# Patient Record
Sex: Male | Born: 1960 | Race: White | Hispanic: No | Smoking: Current some day smoker
Health system: Southern US, Community
[De-identification: ages and names within clinical notes are randomized; demographics above are authoritative.]

## PROBLEM LIST (undated history)

## (undated) DIAGNOSIS — C140 Malignant neoplasm of pharynx, unspecified: Secondary | ICD-10-CM

## (undated) DIAGNOSIS — F32A Depression, unspecified: Secondary | ICD-10-CM

## (undated) DIAGNOSIS — F329 Major depressive disorder, single episode, unspecified: Secondary | ICD-10-CM

## (undated) DIAGNOSIS — C3490 Malignant neoplasm of unspecified part of unspecified bronchus or lung: Secondary | ICD-10-CM

## (undated) DIAGNOSIS — K709 Alcoholic liver disease, unspecified: Secondary | ICD-10-CM

## (undated) DIAGNOSIS — R011 Cardiac murmur, unspecified: Secondary | ICD-10-CM

## (undated) DIAGNOSIS — I639 Cerebral infarction, unspecified: Secondary | ICD-10-CM

## (undated) DIAGNOSIS — Z923 Personal history of irradiation: Secondary | ICD-10-CM

## (undated) DIAGNOSIS — F1910 Other psychoactive substance abuse, uncomplicated: Secondary | ICD-10-CM

## (undated) DIAGNOSIS — I4891 Unspecified atrial fibrillation: Secondary | ICD-10-CM

## (undated) DIAGNOSIS — T7840XA Allergy, unspecified, initial encounter: Secondary | ICD-10-CM

## (undated) HISTORY — DX: Personal history of irradiation: Z92.3

## (undated) HISTORY — DX: Cerebral infarction, unspecified: I63.9

## (undated) HISTORY — DX: Major depressive disorder, single episode, unspecified: F32.9

## (undated) HISTORY — DX: Depression, unspecified: F32.A

## (undated) HISTORY — DX: Cardiac murmur, unspecified: R01.1

## (undated) HISTORY — PX: LOBECTOMY: SHX5089

## (undated) HISTORY — DX: Other psychoactive substance abuse, uncomplicated: F19.10

## (undated) HISTORY — DX: Allergy, unspecified, initial encounter: T78.40XA

## (undated) HISTORY — DX: Malignant neoplasm of unspecified part of unspecified bronchus or lung: C34.90

---

## 1999-08-30 ENCOUNTER — Inpatient Hospital Stay (HOSPITAL_COMMUNITY): Admission: EM | Admit: 1999-08-30 | Discharge: 1999-08-31 | Payer: Self-pay | Admitting: Emergency Medicine

## 1999-08-30 ENCOUNTER — Encounter: Payer: Self-pay | Admitting: Emergency Medicine

## 1999-09-14 ENCOUNTER — Ambulatory Visit (HOSPITAL_COMMUNITY): Admission: RE | Admit: 1999-09-14 | Discharge: 1999-09-14 | Payer: Self-pay | Admitting: Cardiology

## 2001-07-13 ENCOUNTER — Emergency Department (HOSPITAL_COMMUNITY): Admission: EM | Admit: 2001-07-13 | Discharge: 2001-07-13 | Payer: Self-pay | Admitting: Emergency Medicine

## 2001-07-13 ENCOUNTER — Encounter: Payer: Self-pay | Admitting: Emergency Medicine

## 2011-02-26 ENCOUNTER — Ambulatory Visit: Payer: Self-pay | Admitting: Hematology & Oncology

## 2011-08-13 ENCOUNTER — Emergency Department (HOSPITAL_COMMUNITY)
Admission: EM | Admit: 2011-08-13 | Discharge: 2011-08-13 | Disposition: A | Discharge status: Home / Self Care | Payer: Self-pay | Attending: Emergency Medicine | Admitting: Emergency Medicine

## 2011-08-13 DIAGNOSIS — R609 Edema, unspecified: Secondary | ICD-10-CM | POA: Insufficient documentation

## 2011-08-13 DIAGNOSIS — M7989 Other specified soft tissue disorders: Secondary | ICD-10-CM | POA: Insufficient documentation

## 2011-08-13 DIAGNOSIS — F17200 Nicotine dependence, unspecified, uncomplicated: Secondary | ICD-10-CM | POA: Insufficient documentation

## 2011-08-13 DIAGNOSIS — Z85819 Personal history of malignant neoplasm of unspecified site of lip, oral cavity, and pharynx: Secondary | ICD-10-CM | POA: Insufficient documentation

## 2011-08-13 HISTORY — DX: Unspecified atrial fibrillation: I48.91

## 2011-08-13 HISTORY — DX: Malignant neoplasm of pharynx, unspecified: C14.0

## 2011-08-13 HISTORY — DX: Alcoholic liver disease, unspecified: K70.9

## 2011-08-13 NOTE — ED Notes (Signed)
Per EMS- Pt. c/o leg swelling to both legs and has had x 3 months.

## 2011-08-13 NOTE — ED Notes (Signed)
WUJ:[id number]<JY> Expected date:08/13/11<BR> Expected time: 4:00 PM<BR> Means of arrival:Ambulance<BR> Comments:<BR> EMS 71 GC, 50 yom peripheral edema for 3 months

## 2011-08-13 NOTE — ED Provider Notes (Signed)
History     CSN: [id number] Arrival date & time: 08/13/2011  4:51 PM   First MD Initiated Contact with Patient 08/13/11 1704    Chief Complaint  Patient presents with  . Leg Swelling    HPI Onset - several months ago Duration - constant Course - worsening gradually Worsened by - nothing Improved by - nothing Pt denies cp/sob/orthopnea/dyspnea on exertion/weakness/back pain/abd pain/abd distention  He reports h/o liver disease He also has h/o throat CA - he denies active chemo/radiation  When asked what brought him in today, he reports has had pain with walking for months No new LE weakness No falls He reports recent admission to [place]    Past Medical History  Diagnosis Date  . Atrial fibrillation   . Throat cancer   . Diabetes mellitus   . 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 Lobectomy     History reviewed. No pertinent family history.  History  Substance Use Topics  . Smoking status: Current Everyday Smoker -- 5 years  . Smokeless tobacco: Never Used  . Alcohol Use: No      Review of Systems  All other systems reviewed and are negative.    Allergies  Review of patient's allergies indicates no known allergies.  Home Medications  No current outpatient prescriptions on file.  BP 111/72  Pulse 79  Temp(Src) 98 F (36.7 C) (Oral)  Resp 20  SpO2 98%  Physical Exam  CONSTITUTIONAL: Well developed/well nourished HEAD AND FACE: Normocephalic/atraumatic EYES: EOMI/PERRL, scleral icterus ENMT: Mucous membranes moist NECK: supple no meningeal signs SPINE:entire spine nontender CV:  no murmurs/rubs/gallops noted LUNGS: Lungs are clear to auscultation bilaterally, no apparent distress ABDOMEN: soft, nontender, no rebound or guarding GU:no cva tenderness NEURO: Pt is awake/alert, moves all extremitiesx4, no focal motor deficits EXTREMITIES: pulses normal, full ROM Symmetric, pitting edema noted in the  Bilateral LE, no erythema noted SKIN: warm, color normal PSYCH: no abnormalities of mood noted   ED Course  Procedures    5:48 PM Will call for old records at [facility]  6:28 PM Records from [facility] Pt with h/o cirrhosis, h/o malnutrition H/o alcohol dependence Pt stable, no signs of CHF Able to ambulate This is a chronic issue, needs outpatient followup  The patient appears reasonably screened and/or stabilized for discharge and I doubt any other medical condition or other [facility] requiring further screening, evaluation, or treatment in the ED at this time prior to discharge.      MDM  Nursing notes reviewed and considered in documentation         Joya Gaskins, MD 08/13/11 [phone]

## 2011-08-13 NOTE — ED Notes (Signed)
Ambulated with use of cane. Pt. C/o burning pain in legs when he ambulated. Pt. Walked on heels due to pain.

## 2011-10-10 ENCOUNTER — Ambulatory Visit: Payer: Self-pay | Admitting: Family Medicine

## 2012-01-28 ENCOUNTER — Encounter: Payer: Self-pay | Admitting: Family Medicine

## 2012-01-28 ENCOUNTER — Ambulatory Visit (INDEPENDENT_AMBULATORY_CARE_PROVIDER_SITE_OTHER): Payer: Self-pay | Admitting: Family Medicine

## 2012-01-28 VITALS — BP 160/95 | HR 79 | Temp 98.4°F | Ht 72.0 in | Wt 179.8 lb

## 2012-01-28 DIAGNOSIS — Z85118 Personal history of other malignant neoplasm of bronchus and lung: Secondary | ICD-10-CM

## 2012-01-28 DIAGNOSIS — E119 Type 2 diabetes mellitus without complications: Secondary | ICD-10-CM

## 2012-01-28 DIAGNOSIS — Z85819 Personal history of malignant neoplasm of unspecified site of lip, oral cavity, and pharynx: Secondary | ICD-10-CM

## 2012-01-28 DIAGNOSIS — F1011 Alcohol abuse, in remission: Secondary | ICD-10-CM

## 2012-01-28 DIAGNOSIS — Z8673 Personal history of transient ischemic attack (TIA), and cerebral infarction without residual deficits: Secondary | ICD-10-CM

## 2012-01-28 DIAGNOSIS — G8929 Other chronic pain: Secondary | ICD-10-CM

## 2012-01-28 DIAGNOSIS — M2550 Pain in unspecified joint: Secondary | ICD-10-CM

## 2012-01-28 DIAGNOSIS — I10 Essential (primary) hypertension: Secondary | ICD-10-CM

## 2012-01-28 DIAGNOSIS — J309 Allergic rhinitis, unspecified: Secondary | ICD-10-CM

## 2012-01-28 LAB — CBC
HCT: 44 % (ref 39.0–52.0)
Hemoglobin: 15.8 g/dL (ref 13.0–17.0)
MCHC: 35.9 g/dL (ref 30.0–36.0)
MCV: 92.6 fL (ref 78.0–100.0)
RDW: 13.6 % (ref 11.5–15.5)
WBC: 3.9 10*3/uL — ABNORMAL LOW (ref 4.0–10.5)

## 2012-01-28 LAB — COMPREHENSIVE METABOLIC PANEL
AST: 23 U/L (ref 0–37)
Albumin: 4.4 g/dL (ref 3.5–5.2)
BUN: 14 mg/dL (ref 6–23)
Calcium: 9.4 mg/dL (ref 8.4–10.5)
Chloride: 101 mEq/L (ref 96–112)
Creat: 1.04 mg/dL (ref 0.50–1.35)
Glucose, Bld: 245 mg/dL — ABNORMAL HIGH (ref 70–99)
Potassium: 4.1 mEq/L (ref 3.5–5.3)

## 2012-01-28 NOTE — Progress Notes (Signed)
  Subjective:    Patient ID: William Townsend, male    DOB: 1961-06-05, 51 y.o.   MRN: [id number]  HPI  Foot pain, chronic pain:  patient has had chronic pain of multiple joints, both feet have been bothering him the most. Pain is located at bottom of foot bilaterally. He describes it as a tingly sensation at first. However, after walking or working on his feet all day, pain becomes worse.  He takes one Aleve daily which seems to help. He has also been using Dr. Margart Sickles shoe inserts to keep pressure  and off bottom of the foot.  Diabetes type 2: Patient has run out of metformin 4 weeks ago.  He complains of tingling sensation bilateral feet. Denies any blurry vision, headache, sweating, lightheadedness or dizziness. Patient does not check sugars. He tries to avoid sugary foods and he is physically active.  Elevated blood pressure: Patient denies any history of hypertension. He was taking propanolol twice a day but he ran out of medication several months ago. He denies any headache, blurry vision, fatigue, chest pain, difficulty breathing. Endorses numbness tingling bilateral feet.    Review of Systems Per history of present illness    Objective:   Physical Exam  Constitutional: No distress.  Neck: Neck supple.  Cardiovascular: Normal rate, normal heart sounds and intact distal pulses.   No murmur heard. Pulmonary/Chest: Effort normal and breath sounds normal. He has no wheezes. He has no rales.  Abdominal: Soft. Bowel sounds are normal. He exhibits no distension. There is no tenderness.  Musculoskeletal: Normal range of motion. He exhibits no edema.  Neurological: He is alert. No cranial nerve deficit.          Assessment & Plan:

## 2012-01-28 NOTE — Assessment & Plan Note (Signed)
Will order kidney function tests and A1c today. Will restart metformin if kidney function is normal.

## 2012-01-28 NOTE — Assessment & Plan Note (Signed)
Patient's blood pressure is elevated today. He has stopped taking propanolol several months ago. Will check CBC and electrolytes today. Patient is planning to renew orange card and will hopefully qualify for medical assistance program. Will repeat blood pressure in 4 weeks. If blood pressure still elevated, plan to restart blood pressure medications.

## 2012-01-28 NOTE — Assessment & Plan Note (Signed)
Most painful site today: Bilateral foot pain. May be secondary to plantar fasciitis versus diabetic neuropathy versus osteoarthritis. Check A1c to see if patient's diabetes is well controlled. Recommended Dr. Margart Sickles shoe inserts if this is plantar fasciitis. Continue Aleve 3 times a day as needed for pain. Followup in 4 weeks.

## 2012-01-28 NOTE — Patient Instructions (Signed)
It was nice to meet you today, Mr. William Townsend. I will call or send you a letter in 2-3 days with lab results. For foot pain, continue to take Aleve 2 tablets every 8 hours as needed. You may ice your feet and elevate them while resting. Please schedule follow up appointment with Dr. Tye Savoy in 4 weeks. Let us know when you have renewed your [facility].

## 2012-02-04 ENCOUNTER — Telehealth: Payer: Self-pay | Admitting: Family Medicine

## 2012-02-04 NOTE — Telephone Encounter (Signed)
Pt hasn't heard anything about the results of his labs.

## 2012-02-05 ENCOUNTER — Encounter: Payer: Self-pay | Admitting: Family Medicine

## 2012-02-05 NOTE — Telephone Encounter (Signed)
Please inform him that hemoglobin A1c was normal.  All other labs were within normal limits.  I have sent a letter to his address for his records.  Thank you.

## 2012-02-06 NOTE — Telephone Encounter (Signed)
LMOM for patient to call back. I mailed the letter out today also

## 2012-02-28 ENCOUNTER — Ambulatory Visit: Payer: Self-pay | Admitting: Family Medicine

## 2012-03-24 ENCOUNTER — Ambulatory Visit: Payer: Self-pay | Admitting: Family Medicine

## 2013-04-01 ENCOUNTER — Telehealth: Payer: Self-pay | Admitting: *Deleted

## 2013-04-01 NOTE — Telephone Encounter (Signed)
Pt called and voice mail left regarding scheduling of yearly diabetic check and A1C , letter also sent to pt home. Elizabeth Eyob Godlewski, RN-BSN   

## 2013-04-02 ENCOUNTER — Encounter: Payer: Self-pay | Admitting: Family Medicine

## 2013-04-02 ENCOUNTER — Ambulatory Visit (INDEPENDENT_AMBULATORY_CARE_PROVIDER_SITE_OTHER): Payer: Self-pay | Admitting: Family Medicine

## 2013-04-02 VITALS — BP 138/89 | HR 76 | Temp 98.8°F | Ht 72.0 in | Wt 175.0 lb

## 2013-04-02 DIAGNOSIS — F17200 Nicotine dependence, unspecified, uncomplicated: Secondary | ICD-10-CM

## 2013-04-02 DIAGNOSIS — I10 Essential (primary) hypertension: Secondary | ICD-10-CM

## 2013-04-02 DIAGNOSIS — R519 Headache, unspecified: Secondary | ICD-10-CM | POA: Insufficient documentation

## 2013-04-02 DIAGNOSIS — Z720 Tobacco use: Secondary | ICD-10-CM

## 2013-04-02 DIAGNOSIS — E119 Type 2 diabetes mellitus without complications: Secondary | ICD-10-CM

## 2013-04-02 DIAGNOSIS — R51 Headache: Secondary | ICD-10-CM

## 2013-04-02 MED ORDER — CYCLOBENZAPRINE HCL 10 MG PO TABS: 10.0000 mg | ORAL_TABLET | Freq: Two times a day (BID) | ORAL | Status: DC | PRN

## 2013-04-02 MED ORDER — HYDROCODONE-IBUPROFEN 5-200 MG PO TABS: 1.0000 | ORAL_TABLET | Freq: Three times a day (TID) | ORAL | Status: DC | PRN

## 2013-04-02 NOTE — Progress Notes (Signed)
Subjective:     Patient ID: William Townsend, male   DOB: 07/03/1961, 52 y.o.   MRN: [id number]  HPI  Headache: Patient has a history of headache for 1 month. He reports it is mostly over his left side and eye area, along with across his forehead. He reports he has been taking Advil for it daily and it is not working. He borrowed a few hydrocodone from his roommate and they helped a little, but did not take it away. He has noticed over the weekend it has become worse, keeping him awake at night. He noticed mild intermittent blurry vision that started yesterday. He wears reading glasses, but has not been to eye doctor in years. Patient has a history of diabetes, but currently is not on medications. He reports he was told he no longer needed them the last time he saw a doctor. He also has a history of cancer of the throat and lung, and he continues to smoke. He had a h/o of hypertension, but also controlled without medications. He admits to weight loss over the years that he believes contributed to his improved health. He denies nausea, vomit, diarrhea or congestion. He denies fever.   Patient's past medical, social, and family history were reviewed and updated as appropriate. Review of Systems Negative, with the exception of above mentioned in HPI     Objective:   Physical Exam BP 138/89  Pulse 76  Temp(Src) 98.8 F (37.1 C) (Oral)  Ht 6' (1.829 m)  Wt 175 lb (79.379 kg)  BMI 23.73 kg/m2 Gen: Afebrile. Pleasant. Quiet thin male. NAD HEENT: AT.[place]. No TTP. Bilateral eyes without injections or icterus. Bilateral ears gray, translucent (mildy dull) without erythema, neutral position. Throat: MMM, no erythema. No facial TTP over sinuses.  CV: RRR Chest CTAB Neuro: PERLA, EOMI. Muscle strength symmetric and 5/5 UE/LE. CN 2-12 intact, without deficits. DTR symmetric  Vision exam per nursing staff: OD: 20/30  OS: 20/40  Bilateral: 20/25

## 2013-04-02 NOTE — Patient Instructions (Addendum)
Cluster Headache Cluster headaches are recognized by their pattern of deep, intense head pain. They normally occur on one side of the head. Typically, cluster headaches:  Are severe in nature.  Occur repeatedly over weeks to months at a time and are then followed by periods of no headaches.  Can last 15 minutes to 3 hours.  Occur at the same time each day, often at night.  Occur several times a day. CAUSES The exact cause of a cluster headache is not known. Some things can trigger a cluster headache, such as:  Smoking.  Alcohol use.  Lack of sleep.  High altitude travel, such as airline travel.  Change of seasons.  Certain foods, such as foods or drinks that contain nitrates, glutamate, aspartame, or tyramine. SYMPTOMS   Severe pain that begins in or around the eye or temple.  One-sided head pain.  Feeling sick to your stomach (nauseous).  Sensitivity to light.  Runny nose.  Eye redness, tearing, and nasal stuffiness on the side of your head where you are experiencing pain.  Sweaty, pale skin of the face.  Droopy eyelid. DIAGNOSIS  Cluster headaches are diagnosed based on symptoms and physical exam. Your caregiver may order a computerized X-ray scan (CT or CAT scan) or a computerized magnetic scan (MRI) of your head or other lab tests to see if your headaches are caused from other medical conditions.  TREATMENT   Medications for pain relief and to prevent recurrent attacks.  Oxygen for pain relief.  Biofeedback programs may help reduce headache pain. Some people may need a combination of medicines for cluster headaches. It may be helpful to keep a headache diary. This may help you find a trend for what is triggering your headaches. Your caregiver can develop a treatment plan that can be helpful to you.  HOME CARE INSTRUCTIONS  During cluster periods:  Follow a regular sleep schedule.Do not vary the amount and time that you sleep from day to day. It is  important to stay on the same schedule during a cluster period to help prevent headaches.  Avoid alcohol.  Stop smoking. SEEK IMMEDIATE MEDICAL CARE IF:   You have any changes from your previous cluster headaches either in intensity or frequency.  You are not getting relief from medicines you are taking.  You faint.  You develop weakness or numbness, especially on one side of your body or face.  You develop double vision.  You develop nausea or vomiting.  You cannot keep your balance or have difficulty talking or walking.  You develop neck pain or neck stiffness.  You have a fever. Document Released: 08/26/2005 Document Revised: 11/18/2011 Document Reviewed: 11/24/2010 [facility] Patient Information 2014 [place], [place]. Headache Headaches are caused by many different problems. Most commonly, headache is caused by muscle tension from an injury, fatigue, or emotional upset. Excessive muscle contractions in the scalp and neck result in a headache that often feels like a tight band around the head. Tension headaches often have areas of tenderness over the scalp and the back of the neck. These headaches may last for hours, days, or longer, and some may contribute to migraines in those who have migraine problems. Migraines usually cause a throbbing headache, which is made worse by activity. Sometimes only one side of the head hurts. Nausea, vomiting, eye pain, and avoidance of food are common with migraines. Visual symptoms such as light sensitivity, blind spots, or flashing lights may also occur. Loud noises may worsen migraine headaches. Many factors may  cause migraine headaches:  Emotional stress, lack of sleep, and menstrual periods.   Alcohol and some drugs (such as birth control pills).   Diet factors (fasting, caffeine, food preservatives, chocolate).   Environmental factors (weather changes, bright lights, odors, smoke).  Other causes of headaches include minor injuries to the  head. Arthritis in the neck; problems with the jaw, eyes, ears, or nose are also causes of headaches. Allergies, drugs, alcohol, and exposure to smoke can also cause moderate headaches. Rebound headaches can occur if someone uses pain medications for a long period of time and then stops. Less commonly, blood vessel problems in the neck and brain (including stroke) can cause various types of headache. Treatment of headaches includes medicines for pain and relaxation. Ice packs or heat applied to the back of the head and neck help some people. Massaging the shoulders, neck and scalp are often very useful. Relaxation techniques and stretching can help prevent these headaches. Avoid alcohol and cigarette smoking as these tend to make headaches worse. Please see your caregiver if your headache is not better in 2 days.  SEEK IMMEDIATE MEDICAL CARE IF:   You develop a high fever, chills, or repeated vomiting.   You faint or have difficulty with vision.   You develop unusual numbness or weakness of your arms or legs.   Relief of pain is inadequate with medication, or you develop severe pain.   You develop confusion, or neck stiffness.   You have a worsening of a headache or do not obtain relief.  Document Released: 08/26/2005 Document Revised: 08/15/2011 Document Reviewed: 02/19/2007 [facility] Patient Information 2012 [place], [place].

## 2013-04-03 ENCOUNTER — Encounter: Payer: Self-pay | Admitting: Family Medicine

## 2013-04-03 DIAGNOSIS — Z720 Tobacco use: Secondary | ICD-10-CM | POA: Insufficient documentation

## 2013-04-03 NOTE — Assessment & Plan Note (Signed)
-   offered counseling if he desired.

## 2013-04-03 NOTE — Assessment & Plan Note (Signed)
-  patient reports he is not taking any medications for his DM and was told he did not have to do so any longer.  - Asked him to f/u with his new PCP within a month.

## 2013-04-03 NOTE — Assessment & Plan Note (Signed)
-   With normal neuro exam and vision screen, I believe his headache is more of either tension or atypical migraine. He has admitted to recent addition of reading glasses, which also may be playing a role. Allergies are also a consideration, although exam does not indicate a sinus infection. There is mild concern with his cancer history and will need to be investigated if headaches do not resolve with current treatment.  - He does not have medical coverage, which makes some medications he may benefit from to not be an option.  - Explained rebound headache and use of NSAIDS causing this phenomena.  - I have prescribed flexeril and vicoprophen today, and asked he take a flexeril nightly for the next 2-3 nights. Cautioned against ETOH use with medications. Advised him to take OTC anti-histamine for allergies.  - Advised patient if his headaches do not improve within a week or become worse to seek care either in the ED or with this office.

## 2013-04-03 NOTE — Assessment & Plan Note (Addendum)
-   Patient is still without insurance or orange card. BP 138/89 in office today without medications for over a year.  - Patient admits to positive lifestyle changes that likely helped with both his DM and HTN. - F/U with new PCP

## 2013-04-12 ENCOUNTER — Telehealth: Payer: Self-pay | Admitting: *Deleted

## 2013-04-12 NOTE — Telephone Encounter (Signed)
Pharmacy called and the Vicoprofen 5-200 is $80.  Pt cannot afford.  Pharmacist wants to know if they can substitute Norco 5-325 which will be $25.  Will fwd to MD.  Radene Ou, CMA

## 2013-04-13 NOTE — Telephone Encounter (Signed)
Pharmacy notified of the change.  Linh Johannes, Darlyne Russian, CMA

## 2013-04-13 NOTE — Telephone Encounter (Signed)
Substitute of Norco 5-325 is ok with me.

## 2013-04-13 NOTE — Telephone Encounter (Signed)
Patient saw Dr. Claiborne Billings when he came in so this will need to be forwarded to her.  This is from [facility] on [address].

## 2013-04-19 ENCOUNTER — Ambulatory Visit: Payer: Self-pay

## 2013-04-20 ENCOUNTER — Ambulatory Visit: Payer: Self-pay | Admitting: Family Medicine

## 2013-04-21 ENCOUNTER — Inpatient Hospital Stay (HOSPITAL_COMMUNITY): Payer: Self-pay

## 2013-04-21 ENCOUNTER — Other Ambulatory Visit: Payer: Self-pay

## 2013-04-21 ENCOUNTER — Emergency Department (HOSPITAL_COMMUNITY): Payer: Self-pay

## 2013-04-21 ENCOUNTER — Inpatient Hospital Stay (HOSPITAL_COMMUNITY)
Admission: EM | Admit: 2013-04-21 | Discharge: 2013-05-01 | DRG: 025 | Disposition: A | Discharge status: Home / Self Care | Payer: Self-pay | Attending: Family Medicine | Admitting: Family Medicine

## 2013-04-21 ENCOUNTER — Encounter (HOSPITAL_COMMUNITY): Payer: Self-pay | Admitting: Emergency Medicine

## 2013-04-21 DIAGNOSIS — K709 Alcoholic liver disease, unspecified: Secondary | ICD-10-CM | POA: Diagnosis present

## 2013-04-21 DIAGNOSIS — F329 Major depressive disorder, single episode, unspecified: Secondary | ICD-10-CM | POA: Diagnosis present

## 2013-04-21 DIAGNOSIS — J984 Other disorders of lung: Secondary | ICD-10-CM

## 2013-04-21 DIAGNOSIS — E119 Type 2 diabetes mellitus without complications: Secondary | ICD-10-CM | POA: Diagnosis present

## 2013-04-21 DIAGNOSIS — M2550 Pain in unspecified joint: Secondary | ICD-10-CM | POA: Diagnosis present

## 2013-04-21 DIAGNOSIS — H53469 Homonymous bilateral field defects, unspecified side: Secondary | ICD-10-CM | POA: Diagnosis present

## 2013-04-21 DIAGNOSIS — F3289 Other specified depressive episodes: Secondary | ICD-10-CM | POA: Diagnosis present

## 2013-04-21 DIAGNOSIS — G8929 Other chronic pain: Secondary | ICD-10-CM | POA: Diagnosis present

## 2013-04-21 DIAGNOSIS — Z85819 Personal history of malignant neoplasm of unspecified site of lip, oral cavity, and pharynx: Secondary | ICD-10-CM

## 2013-04-21 DIAGNOSIS — J4489 Other specified chronic obstructive pulmonary disease: Secondary | ICD-10-CM | POA: Diagnosis present

## 2013-04-21 DIAGNOSIS — J449 Chronic obstructive pulmonary disease, unspecified: Secondary | ICD-10-CM | POA: Diagnosis present

## 2013-04-21 DIAGNOSIS — G936 Cerebral edema: Secondary | ICD-10-CM | POA: Diagnosis present

## 2013-04-21 DIAGNOSIS — R51 Headache: Secondary | ICD-10-CM | POA: Diagnosis present

## 2013-04-21 DIAGNOSIS — C787 Secondary malignant neoplasm of liver and intrahepatic bile duct: Secondary | ICD-10-CM | POA: Diagnosis present

## 2013-04-21 DIAGNOSIS — G9389 Other specified disorders of brain: Secondary | ICD-10-CM | POA: Diagnosis present

## 2013-04-21 DIAGNOSIS — Z85118 Personal history of other malignant neoplasm of bronchus and lung: Secondary | ICD-10-CM

## 2013-04-21 DIAGNOSIS — Z66 Do not resuscitate: Secondary | ICD-10-CM | POA: Diagnosis present

## 2013-04-21 DIAGNOSIS — R911 Solitary pulmonary nodule: Secondary | ICD-10-CM | POA: Diagnosis present

## 2013-04-21 DIAGNOSIS — F17200 Nicotine dependence, unspecified, uncomplicated: Secondary | ICD-10-CM | POA: Diagnosis present

## 2013-04-21 DIAGNOSIS — F1011 Alcohol abuse, in remission: Secondary | ICD-10-CM

## 2013-04-21 DIAGNOSIS — C801 Malignant (primary) neoplasm, unspecified: Secondary | ICD-10-CM

## 2013-04-21 DIAGNOSIS — Z923 Personal history of irradiation: Secondary | ICD-10-CM

## 2013-04-21 DIAGNOSIS — K5900 Constipation, unspecified: Secondary | ICD-10-CM | POA: Diagnosis present

## 2013-04-21 DIAGNOSIS — R519 Headache, unspecified: Secondary | ICD-10-CM | POA: Diagnosis present

## 2013-04-21 DIAGNOSIS — G939 Disorder of brain, unspecified: Secondary | ICD-10-CM

## 2013-04-21 DIAGNOSIS — I4891 Unspecified atrial fibrillation: Secondary | ICD-10-CM | POA: Diagnosis present

## 2013-04-21 DIAGNOSIS — K7689 Other specified diseases of liver: Secondary | ICD-10-CM

## 2013-04-21 DIAGNOSIS — Z720 Tobacco use: Secondary | ICD-10-CM | POA: Diagnosis present

## 2013-04-21 DIAGNOSIS — C7889 Secondary malignant neoplasm of other digestive organs: Secondary | ICD-10-CM | POA: Diagnosis present

## 2013-04-21 DIAGNOSIS — G4700 Insomnia, unspecified: Secondary | ICD-10-CM | POA: Diagnosis present

## 2013-04-21 DIAGNOSIS — C799 Secondary malignant neoplasm of unspecified site: Secondary | ICD-10-CM

## 2013-04-21 DIAGNOSIS — K769 Liver disease, unspecified: Secondary | ICD-10-CM | POA: Diagnosis present

## 2013-04-21 DIAGNOSIS — Z8673 Personal history of transient ischemic attack (TIA), and cerebral infarction without residual deficits: Secondary | ICD-10-CM

## 2013-04-21 DIAGNOSIS — C7800 Secondary malignant neoplasm of unspecified lung: Secondary | ICD-10-CM | POA: Diagnosis present

## 2013-04-21 DIAGNOSIS — C7931 Secondary malignant neoplasm of brain: Principal | ICD-10-CM | POA: Diagnosis present

## 2013-04-21 DIAGNOSIS — I10 Essential (primary) hypertension: Secondary | ICD-10-CM | POA: Diagnosis present

## 2013-04-21 LAB — POCT I-STAT, CHEM 8
BUN: 13 mg/dL (ref 6–23)
Chloride: 100 mEq/L (ref 96–112)
Creatinine, Ser: 1 mg/dL (ref 0.50–1.35)
Glucose, Bld: 305 mg/dL — ABNORMAL HIGH (ref 70–99)
Hemoglobin: 16 g/dL (ref 13.0–17.0)
Potassium: 4 mEq/L (ref 3.5–5.1)
Sodium: 137 mEq/L (ref 135–145)

## 2013-04-21 LAB — COMPREHENSIVE METABOLIC PANEL
AST: 29 U/L (ref 0–37)
Albumin: 4 g/dL (ref 3.5–5.2)
CO2: 27 mEq/L (ref 19–32)
Calcium: 9.6 mg/dL (ref 8.4–10.5)
Creatinine, Ser: 0.89 mg/dL (ref 0.50–1.35)
GFR calc non Af Amer: >90 mL/min (ref >90)

## 2013-04-21 LAB — CBC WITH DIFFERENTIAL/PLATELET
Eosinophils Absolute: 0.1 10*3/uL (ref 0.0–0.7)
Hemoglobin: 16.6 g/dL (ref 13.0–17.0)
Lymphocytes Relative: 25 % (ref 12–46)
Lymphs Abs: 1.1 10*3/uL (ref 0.7–4.0)
MCH: 33.2 pg (ref 26.0–34.0)
Monocytes Relative: 11 % (ref 3–12)
Neutro Abs: 2.6 10*3/uL (ref 1.7–7.7)
Neutrophils Relative %: 63 % (ref 43–77)
Platelets: 139 10*3/uL — ABNORMAL LOW (ref 150–400)
RBC: 5 MIL/uL (ref 4.22–5.81)
WBC: 4.2 10*3/uL (ref 4.0–10.5)

## 2013-04-21 LAB — GLUCOSE, CAPILLARY
Glucose-Capillary: 214 mg/dL — ABNORMAL HIGH (ref 70–99)
Glucose-Capillary: 422 mg/dL — ABNORMAL HIGH (ref 70–99)
Glucose-Capillary: 469 mg/dL — ABNORMAL HIGH (ref 70–99)

## 2013-04-21 LAB — BASIC METABOLIC PANEL
GFR calc Af Amer: >90 mL/min (ref >90)
GFR calc non Af Amer: >90 mL/min (ref >90)
Potassium: 5 mEq/L (ref 3.5–5.1)
Sodium: 128 mEq/L — ABNORMAL LOW (ref 135–145)

## 2013-04-21 LAB — POCT I-STAT TROPONIN I

## 2013-04-21 MED ORDER — SODIUM CHLORIDE 0.9 % IJ SOLN
3.0000 mL | Freq: Two times a day (BID) | INTRAMUSCULAR | Status: DC
  Administered 2013-04-21 – 2013-04-28 (×9): 3 mL via INTRAVENOUS

## 2013-04-21 MED ORDER — ADULT MULTIVITAMIN W/MINERALS CH
1.0000 | ORAL_TABLET | Freq: Every day | ORAL | Status: DC
  Administered 2013-04-21 – 2013-05-01 (×10): 1 via ORAL
  Filled 2013-04-21 (×11): qty 1

## 2013-04-21 MED ORDER — INSULIN ASPART 100 UNIT/ML ~~LOC~~ SOLN
0.0000 [IU] | Freq: Three times a day (TID) | SUBCUTANEOUS | Status: DC
  Administered 2013-04-21: 3 [IU] via SUBCUTANEOUS
  Administered 2013-04-21: 9 [IU] via SUBCUTANEOUS
  Administered 2013-04-22: 5 [IU] via SUBCUTANEOUS
  Filled 2013-04-21: qty 3

## 2013-04-21 MED ORDER — ONDANSETRON HCL 4 MG/2ML IJ SOLN: 4.0000 mg | Freq: Four times a day (QID) | INTRAMUSCULAR | Status: DC | PRN

## 2013-04-21 MED ORDER — MORPHINE SULFATE 2 MG/ML IJ SOLN
2.0000 mg | INTRAMUSCULAR | Status: DC | PRN
  Administered 2013-04-21 – 2013-04-27 (×29): 2 mg via INTRAVENOUS
  Filled 2013-04-21 (×30): qty 1

## 2013-04-21 MED ORDER — IOHEXOL 300 MG/ML  SOLN
80.0000 mL | Freq: Once | INTRAMUSCULAR | Status: AC | PRN
  Administered 2013-04-21: 80 mL via INTRAVENOUS

## 2013-04-21 MED ORDER — MORPHINE SULFATE 2 MG/ML IJ SOLN
1.0000 mg | INTRAMUSCULAR | Status: DC | PRN
  Administered 2013-04-21: 1 mg via INTRAVENOUS
  Filled 2013-04-21: qty 1

## 2013-04-21 MED ORDER — THIAMINE HCL 100 MG/ML IJ SOLN
100.0000 mg | Freq: Every day | INTRAMUSCULAR | Status: DC
  Filled 2013-04-21: qty 1

## 2013-04-21 MED ORDER — SODIUM CHLORIDE 0.9 % IV SOLN
INTRAVENOUS | Status: DC
  Administered 2013-04-21 – 2013-04-26 (×6): via INTRAVENOUS
  Administered 2013-04-26: 1000 mL via INTRAVENOUS
  Administered 2013-04-27 (×2): via INTRAVENOUS

## 2013-04-21 MED ORDER — INSULIN ASPART 100 UNIT/ML ~~LOC~~ SOLN
15.0000 [IU] | Freq: Once | SUBCUTANEOUS | Status: AC
  Administered 2013-04-21: 15 [IU] via SUBCUTANEOUS

## 2013-04-21 MED ORDER — HYDROCODONE-ACETAMINOPHEN 10-325 MG PO TABS: 1.0000 | ORAL_TABLET | Freq: Four times a day (QID) | ORAL | Status: DC | PRN

## 2013-04-21 MED ORDER — DEXAMETHASONE SODIUM PHOSPHATE 10 MG/ML IJ SOLN: 10.0000 mg | Freq: Three times a day (TID) | INTRAMUSCULAR | Status: DC

## 2013-04-21 MED ORDER — HYDRALAZINE HCL 20 MG/ML IJ SOLN: 5.0000 mg | INTRAMUSCULAR | Status: DC | PRN

## 2013-04-21 MED ORDER — FOLIC ACID 1 MG PO TABS
1.0000 mg | ORAL_TABLET | Freq: Every day | ORAL | Status: DC
  Administered 2013-04-21 – 2013-05-01 (×10): 1 mg via ORAL
  Filled 2013-04-21 (×11): qty 1

## 2013-04-21 MED ORDER — LORAZEPAM 2 MG/ML IJ SOLN: 1.0000 mg | Freq: Four times a day (QID) | INTRAMUSCULAR | Status: AC | PRN

## 2013-04-21 MED ORDER — NICOTINE 7 MG/24HR TD PT24
7.0000 mg | MEDICATED_PATCH | Freq: Every day | TRANSDERMAL | Status: DC
  Administered 2013-04-21 – 2013-04-22 (×2): 7 mg via TRANSDERMAL
  Filled 2013-04-21 (×2): qty 1

## 2013-04-21 MED ORDER — GADOBENATE DIMEGLUMINE 529 MG/ML IV SOLN
17.0000 mL | Freq: Once | INTRAVENOUS | Status: AC
  Administered 2013-04-21: 17 mL via INTRAVENOUS

## 2013-04-21 MED ORDER — SENNOSIDES-DOCUSATE SODIUM 8.6-50 MG PO TABS
1.0000 | ORAL_TABLET | Freq: Every evening | ORAL | Status: DC | PRN
  Filled 2013-04-21: qty 1

## 2013-04-21 MED ORDER — POLYETHYLENE GLYCOL 3350 17 G PO PACK
17.0000 g | PACK | Freq: Every day | ORAL | Status: DC
  Administered 2013-04-21 – 2013-04-25 (×5): 17 g via ORAL
  Filled 2013-04-21 (×11): qty 1

## 2013-04-21 MED ORDER — LORAZEPAM 1 MG PO TABS: 1.0000 mg | ORAL_TABLET | Freq: Four times a day (QID) | ORAL | Status: AC | PRN

## 2013-04-21 MED ORDER — DEXAMETHASONE SODIUM PHOSPHATE 10 MG/ML IJ SOLN
10.0000 mg | Freq: Three times a day (TID) | INTRAMUSCULAR | Status: DC
  Administered 2013-04-21 – 2013-04-24 (×9): 10 mg via INTRAVENOUS
  Filled 2013-04-21 (×13): qty 1

## 2013-04-21 MED ORDER — SODIUM CHLORIDE 0.9 % IV BOLUS (SEPSIS)
1000.0000 mL | Freq: Once | INTRAVENOUS | Status: AC
  Administered 2013-04-21: 1000 mL via INTRAVENOUS

## 2013-04-21 MED ORDER — VITAMIN B-1 100 MG PO TABS
100.0000 mg | ORAL_TABLET | Freq: Every day | ORAL | Status: DC
  Administered 2013-04-21 – 2013-05-01 (×10): 100 mg via ORAL
  Filled 2013-04-21 (×11): qty 1

## 2013-04-21 MED ORDER — ONDANSETRON HCL 4 MG PO TABS: 4.0000 mg | ORAL_TABLET | Freq: Four times a day (QID) | ORAL | Status: DC | PRN

## 2013-04-21 NOTE — ED Provider Notes (Signed)
CSN: [id number]     Arrival date & time 04/21/13  4098 History     First MD Initiated Contact with Patient 04/21/13 0654     Chief Complaint  Patient presents with  . Headache  . Near Syncope   (Consider location/radiation/quality/duration/timing/severity/associated sxs/prior Treatment) Patient is a 52 y.o. male presenting with headaches. The history is provided by the patient.  Headache Associated symptoms: no abdominal pain, no back pain, no diarrhea, no pain, no nausea, no neck stiffness, no numbness and no vomiting    patient presents with left-sided headache. It is dull. His been going for a couple months but worse in the last couple weeks. He states he also has began to have blurred vision on the right side. He states looking straight ahead he sees one and a half of things in the right side it is blurry. Tabs with both eyes. He also had an episode of near-syncope the last 2 mornings. She is gotten out of bed and felt dizzy and had to sit back down. No confusion. No Chest pain. No numbness or weakness.  Past Medical History  Diagnosis Date  . Atrial fibrillation   . Diabetes mellitus   . 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 Allergy   . Substance abuse   . Depression   . Stroke     3 previous strokes - Oct, Nov, and Dec 2012  . Throat cancer     September 2008  . Lung cancer     lobectomy in 2010  . Heart murmur    Past Surgical History  Procedure Laterality Date  . Lobectomy     Family History  Problem Relation Age of Onset  . Diabetes Mother   . Early death Mother   . Heart disease Father   . Stroke Father   . Asthma Sister   . Depression Sister    History  Substance Use Topics  . Smoking status: Current Every Day Smoker -- 0.50 packs/day for 30 years    Types: Cigarettes  . Smokeless tobacco: Never Used  . Alcohol Use: 0.0 oz/week    1-2 Cans of beer per week     Comment: couple beers at night    Review of Systems  Constitutional: Negative for activity  change and appetite change.  HENT: Negative for neck stiffness.   Eyes: Positive for visual disturbance. Negative for pain.  Respiratory: Negative for chest tightness and shortness of breath.   Cardiovascular: Negative for chest pain and leg swelling.  Gastrointestinal: Negative for nausea, vomiting, abdominal pain and diarrhea.  Genitourinary: Negative for flank pain.  Musculoskeletal: Negative for back pain.  Skin: Negative for rash.  Neurological: Positive for light-headedness and headaches. Negative for weakness and numbness.  Psychiatric/Behavioral: Negative for behavioral problems.    Allergies  Review of patient's allergies indicates no known allergies.  Home Medications   No current outpatient prescriptions on file. BP 101/48  Pulse 70  Temp(Src) 98.2 F (36.8 C) (Oral)  Resp 20  Ht 6\' 1"  (1.854 m)  Wt 174 lb 14.4 oz (79.334 kg)  BMI 23.08 kg/m2  SpO2 96% Physical Exam  Nursing note and vitals reviewed. Constitutional: He is oriented to person, place, and time. He appears well-developed and well-nourished.  HENT:  Head: Normocephalic and atraumatic.  Eyes: Pupils are equal, round, and reactive to light.  Mildly decreased movement of right eye medially with crossing of eyes.   Neck: Normal range of motion. Neck supple.  Cardiovascular: Normal rate,  regular rhythm and normal heart sounds.   No murmur heard. Pulmonary/Chest: Effort normal and breath sounds normal.  Abdominal: Soft. Bowel sounds are normal. He exhibits no distension and no mass. There is no tenderness. There is no rebound and no guarding.  Musculoskeletal: Normal range of motion. He exhibits no edema.  Neurological: He is alert and oriented to person, place, and time. No cranial nerve deficit. Coordination normal.  Blurring of visual fields on right visual field on both eyes.   Skin: Skin is warm and dry.  Psychiatric: He has a normal mood and affect.    ED Course   Procedures (including critical  care time)  Labs Reviewed  CBC WITH DIFFERENTIAL - Abnormal; Notable for the following:    MCHC 36.6 (*)    Platelets 139 (*)    All other components within normal limits  HEMOGLOBIN A1C - Abnormal; Notable for the following:    Hemoglobin A1C 8.7 (*)    Mean Plasma Glucose 203 (*)    All other components within normal limits  COMPREHENSIVE METABOLIC PANEL - Abnormal; Notable for the following:    Glucose, Bld 247 (*)    Alkaline Phosphatase 122 (*)    All other components within normal limits  GLUCOSE, CAPILLARY - Abnormal; Notable for the following:    Glucose-Capillary 214 (*)    All other components within normal limits  GLUCOSE, CAPILLARY - Abnormal; Notable for the following:    Glucose-Capillary 422 (*)    All other components within normal limits  BASIC METABOLIC PANEL - Abnormal; Notable for the following:    Sodium 128 (*)    Chloride 91 (*)    Glucose, Bld 549 (*)    All other components within normal limits  GLUCOSE, CAPILLARY - Abnormal; Notable for the following:    Glucose-Capillary 469 (*)    All other components within normal limits  GLUCOSE, CAPILLARY - Abnormal; Notable for the following:    Glucose-Capillary 402 (*)    All other components within normal limits  GLUCOSE, CAPILLARY - Abnormal; Notable for the following:    Glucose-Capillary 200 (*)    All other components within normal limits  POCT I-STAT, CHEM 8 - Abnormal; Notable for the following:    Glucose, Bld 305 (*)    All other components within normal limits  POCT I-STAT TROPONIN I   Dg Chest 2 View  04/21/2013   *RADIOLOGY REPORT*  Clinical Data: Near-syncope  CHEST - 2 VIEW  Comparison: None.  Findings: There is an apparent mass in the posterior segment left upper lobe measuring 4.8 x 2.7 x 3.2 cm in size.  There is postoperative change in the right upper lobe.  Elsewhere, lungs clear.  The heart size and pulmonary vascularity are normal.  No adenopathy.  No bone lesions.  IMPRESSION: Apparent  mass posterior segment left upper lobe. Contrast enhanced chest CT advised to further assess. Postoperative change right lung.  Elsewhere, lungs appear clear.   Original Report Authenticated By: Bretta Bang, M.D.   Ct Head Wo Contrast  04/21/2013   *RADIOLOGY REPORT*  Clinical Data: Headache and near-syncope  CT HEAD WITHOUT CONTRAST  Technique:  Contiguous axial images were obtained from the base of the skull through the vertex without contrast. Study was obtained within 24 hours of patient arrival at the emergency department.  Comparison: None.  Findings: The ventricles are normal in size and configuration. There is a mass-like area with increased attenuation, probably containing hemorrhage, arising in the left occipital  lobe measuring 5.1 x 2.9 cm.  There is surrounding vasogenic edema.  There is a 5 mm focus of increased attenuation at the level of the third ventricle.  This finding may indicate a small focus of hemorrhage.  There is no other evidence of mass or hemorrhage.  There is no extra-axial fluid or midline shift.  Elsewhere gray-white compartments are normal.  Bony calvarium appears intact.  The mastoid air cells are clear.  IMPRESSION: Mass with vasogenic edema that appears to contain a degree of hemorrhage in the left occipital lobe.  A neoplastic lesion is suspected.  MR correlation advised.  There is also an area of questionable hemorrhage along the superior aspect of the third ventricle.  Particular attention this area on MR also advised.   Original Report Authenticated By: Bretta Bang, M.D.   Ct Chest W Contrast  04/21/2013   *RADIOLOGY REPORT*  Clinical Data: Abnormal chest x-ray, history of throat cancer  CT CHEST WITH CONTRAST  Technique:  Multidetector CT imaging of the chest was performed following the standard protocol during bolus administration of intravenous contrast.  Contrast: 80mL OMNIPAQUE IOHEXOL 300 MG/ML  SOLN  Comparison: Chest x-ray same day  Findings: Sagittal  images of the spine shows degenerative changes mid and lower thoracic spine.  No destructive bony lesions are noted.  Images of the thoracic inlet are unremarkable.  Central pulmonary artery is unremarkable.  No aortic aneurysm.  Atherosclerotic calcifications of coronary arteries are noted.  Heart size within normal limits.  There is irregular low density lesion within right hepatic lobe centrally measures 7.4 cm.  Low density lesion in the right hepatic lobe anterior inferiorly chest inferior to gallbladder measures 4.6 cm.  This are highly suspicious for metastatic disease.  There is low density lesion anterior aspect of the spleen measures at least 4 cm.  This is highly suspicious for metastatic disease.  No adrenal gland mass is noted.  Layering small gallstones are noted within gallbladder.  As noted on chest x-ray there is a nodular consolidation in the left upper lobe with some central air bronchogram.  Measures at least 3.3 x 3 cm.  This is highly suspicious for primary or secondary malignancy.  Further evaluation with bronchoscopy and/or biopsy is recommended.  There are postsurgical changes in the right upper lobe.  There is spiculated central scarring in the right upper lobe measures about 2 x 1.8 cm.  No hilar or mediastinal adenopathy.  Central airways are patent.  IMPRESSION:  1.  There is  nodular consolidation with some central air bronchogram in the left upper lobe measures at least 3.3 x 3 cm. This is highly suspicious for primary or secondary malignancy. Further evaluation with bronchoscopy and/or biopsy is recommended. 2.  Postsurgical changes are noted in the right upper lobe. Spiculated scarring in the right upper lobe measures 2 x 1.8 cm. 3.  No adenopathy. 4.  There are low density lesions within liver the largest in right hepatic lobe centrally measures 7.4 cm.  Low density lesion with anterior aspect of the spleen measures 4 cm.  This are highly suspicious for metastatic disease.  Further  evaluation is recommended. 5.  Small layering gallstones are noted within gallbladder.  No adrenal gland mass is noted.  Degenerative changes mid and lower thoracic spine.  Critical findings discussed with Dr.Calloway Andrus.   Original Report Authenticated By: Natasha Mead, M.D.   Mr Laqueta Jean WU Contrast  04/21/2013   *RADIOLOGY REPORT*  Clinical Data:  Eye pain.  Brain mass.  Visual problems.  Syncope.  MRI HEAD AND ORBITS WITHOUT AND WITH CONTRAST  Technique:  Multiplanar, multiecho pulse sequences of the brain and surrounding structures were obtained without and with intravenous contrast. Multiplanar, multiecho pulse sequences of the orbits and surrounding structures were obtained including fat saturation techniques, before and after intravenous contrast administration.  Contrast: MultiHance 17 ml.  Comparison:  CT head 04/21/2013.  MRI HEAD  Findings:  A left occipital mass is redemonstrated, with enhancing cross-sectional measurements of 46 x 32 x 37 mm ( (R-L x A-P x C- C)).  The lesion appears extra-axial.  The lesion has intermediate signal on T1 and intermediate to low signal on T2-weighted images. Mild restricted diffusion.  Inward displacement of cortical vessels.  Dural tail on postcontrast imaging.  Slight central necrosis.  No adjacent hyperostosis of the calvarium.  No invasion of the adjacent venous sinuses.  Overall constellation of findings consistent with a left occipital meningioma.  Moderate associated vasogenic edema.  No intrinsic white matter disease. No acute stroke or hemorrhage.  No hydrocephalus or extra- axial fluid.  In the anterior third ventricle, near the foramen of Monro, there is a 4 mm solid lesion corresponding with the hyperdense abnormality noted on CT.  Mild post contrast enhancement. Considerations would include a second meningioma (intraventricular),  a small colloid cyst, or subependymoma.  There is no associated edema or incipient ventricular obstruction.  Flow voids are  maintained in the major intracranial vessels. Calvarium appears intact. Sinus or mastoid disease.  IMPRESSION: 46 x 32 x 37 mm left occipital extra-axial mass with associated edema but no midline shift.  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 See discussion above.  MRI ORBITS  Findings: The globes are symmetric and within normal limits. Normal appearing optic nerves.  Extraocular muscles intact.  No preseptal or post-septal mass.  No significant paranasal sinus disease.  Normal orbital fat.  No abnormal post contrast enhancement on fat saturated images.  IMPRESSION: Negative MRI of the orbits without and with contrast.   Original Report Authenticated By: Davonna Belling, M.D.   Mr Orbits Wo/w Cm  04/21/2013   *RADIOLOGY REPORT*  Clinical Data:  Eye pain.  Brain mass.  Visual problems.  Syncope.  MRI HEAD AND ORBITS WITHOUT AND WITH CONTRAST  Technique:  Multiplanar, multiecho pulse sequences of the brain and surrounding structures were obtained without and with intravenous contrast. Multiplanar, multiecho pulse sequences of the orbits and surrounding structures were obtained including fat saturation techniques, before and after intravenous contrast administration.  Contrast: MultiHance 17 ml.  Comparison:  CT head 04/21/2013.  MRI HEAD  Findings:  A left occipital mass is redemonstrated, with enhancing cross-sectional measurements of 46 x 32 x 37 mm ( (R-L x A-P x C- C)).  The lesion appears extra-axial.  The lesion has intermediate signal on T1 and intermediate to low signal on T2-weighted images. Mild restricted diffusion.  Inward displacement of cortical vessels.  Dural tail on postcontrast imaging.  Slight central necrosis.  No adjacent hyperostosis of the calvarium.  No invasion of the adjacent venous sinuses.  Overall constellation of findings consistent with a left occipital meningioma.  Moderate associated vasogenic  edema.  No intrinsic white matter disease. No acute stroke or hemorrhage.  No hydrocephalus or extra- axial fluid.  In the anterior third ventricle, near the foramen of Monro, there is a 4 mm solid lesion corresponding with the hyperdense abnormality  noted on CT.  Mild post contrast enhancement. Considerations would include a second meningioma (intraventricular),  a small colloid cyst, or subependymoma.  There is no associated edema or incipient ventricular obstruction.  Flow voids are maintained in the major intracranial vessels. Calvarium appears intact. Sinus or mastoid disease.  IMPRESSION: 46 x 32 x 37 mm left occipital extra-axial mass with associated edema but no midline shift.  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 See discussion above.  MRI ORBITS  Findings: The globes are symmetric and within normal limits. Normal appearing optic nerves.  Extraocular muscles intact.  No preseptal or post-septal mass.  No significant paranasal sinus disease.  Normal orbital fat.  No abnormal post contrast enhancement on fat saturated images.  IMPRESSION: Negative MRI of the orbits without and with contrast.   Original Report Authenticated By: Davonna Belling, M.D.   1. Metastatic cancer   2. Brain mass   3. History of lung cancer   4. History of throat cancer   5. Lesion of lung   6. Liver lesion   7. Tobacco abuse     MDM  Patient with  his headache and some visual changes. CT found to have a mass and x-ray has mass. will admit to family practice Center, who is his primary care Dr.  Juliet Rude. Rubin Payor, MD 04/22/13 [phone]

## 2013-04-21 NOTE — ED Notes (Signed)
Family practice at the bedside.

## 2013-04-21 NOTE — H&P (Signed)
I have seen and examined this patient. I have discussed with Dr Claiborne Billings.  I agree with their findings and plans as documented in their progress note.  Acute Issues 1. Left occipital Brain mass with reactive vasogenic edema - correlative right visual field disturbance (right homonymous hemianopsia) - Associated left para-orbital headache. - Possible area of hemorrhage at level of third ventricle.  - No midline deviation  2. Lung nodule LUL on Chest CT 3. Liver lesion on Chest CT 4. Spleen lesion on Chest CT 5. Personal History of left Tonsillar Lymph node Malignancy per patient treated with XRT and Chemotx -    Monitoring CT/PET scans in [place].   6. 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 - Last CT/PET 06/2009 showed right lung lesion that was metabolically quiet per patient.   Plan - Consult neurology - HEENT exam +/- cervical imaging for local recurrence (?PET/CT) - discuss best site for biopsy with IR for tissue diagnosis of metastatic lesions - Analgesia with opiates - Avoid anticoagulation for now. - Dexamethasone 10 mg every eight hours for focal vasogenic brain parenchymal edema

## 2013-04-21 NOTE — Progress Notes (Addendum)
PGY-2 Interim Note  Pt seen at bedside after MRI. No current new questions/complaints. 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 MRI of orbits are normal.  Plan: -continue current therapies per previous notes -ordered Nicotine patch per pt request -will need to clarify which consultants to be called (neurology vs neurosurgery, IR for ?biopsy, ?oncology) -will reassess as needed  Bobbye Morton, MD PGY-2, [facility] 04/21/2013, 2:58 PM FPTS Service pager: [phone] (text pages welcome through [facility])  Addendum: Consulted Dr. Cyril Mourning of neurology; will also need to consider consulting neurosurg and/or oncology

## 2013-04-21 NOTE — ED Notes (Signed)
Pt requesting pain medication for left eye pain. Currently rating 8/10. Pt calm, alert, cooperative.

## 2013-04-21 NOTE — Consult Note (Addendum)
NEURO HOSPITALIST CONSULT NOTE    Reason for Consult: HA, visual loss, abnormal MRI with left occipital mass.  HPI:                                                                                                                                          William Townsend is an 52 y.o. male with a past medical history significant for DM, atrial fibrillation, substance abuse, alcohol related liver disease, lung cancer status post lobectomy 2010, throat cancer, 2008, depression, stroke without residual deficits, admitted to [facility] with HA, visual disturbance, and abnormal CT/MRI brain. He indicated that for the last 2 months he has been experiencing daily, sharp HA located mainly over the left side of the head that became worse within the past week. In addition, he reports visual impairment in the past 10 days, to the point that he can not read well anymore. The headache is worsened by coughing and sudden postural changes but there is not associated vomiting, vertigo, focal weakness or numbness, unsteadiness, slurred speech, or language impairment. He said that Vicodin is able to control the headache. MRI brain with and without contrast revealed a left enhancing occipital mass with cross-sectional measurements of 46 x 32 x 37 mm with moderate associated vasogenic edema. The lesion appears extra-axial and has a dural tail on postcontrast imaging. Slight central necrosis. No adjacent hyperostosis of the calvarium. No invasion of the adjacent venous sinuses. In addition, there is a 4 mm intraventricular lesion.          Past Medical History  Diagnosis Date  . Atrial fibrillation   . Diabetes mellitus   . 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 Allergy   . Substance abuse   . Depression   . Stroke     3 previous strokes - Oct, Nov, and Dec 2012  . Throat cancer     September 2008  . Lung cancer     lobectomy in 2010  . Heart murmur     Past Surgical History  Procedure Laterality  Date  . Lobectomy      Family History  Problem Relation Age of Onset  . Diabetes Mother   . Early death Mother   . Heart disease Father   . Stroke Father   . Asthma Sister   . Depression Sister     Social History:  reports that he has been smoking Cigarettes.  He has a 15 pack-year smoking history. He has never used smokeless tobacco. He reports that  drinks alcohol. He reports that he uses illicit drugs (Marijuana).  No Known Allergies  MEDICATIONS:  I have reviewed the patient's current medications.   ROS:                                                                                                                                       History obtained from chart review and patient.  General ROS: negative for - chills, fatigue, fever, night sweats, weight gain or weight loss Psychological ROS: negative for - behavioral disorder, hallucinations, memory difficulties, mood swings or suicidal ideation Ophthalmic ROS: negative for - blurry vision, double vision, eye pain or loss of vision ENT ROS: negative for - epistaxis, nasal discharge, oral lesions, sore throat, tinnitus or vertigo Allergy and Immunology ROS: negative for - hives or itchy/watery eyes Hematological and Lymphatic ROS: negative for - bleeding problems, bruising or swollen lymph nodes Endocrine ROS: negative for - galactorrhea, hair pattern changes, polydipsia/polyuria or temperature intolerance Respiratory ROS: negative for - cough, hemoptysis, shortness of breath or wheezing Cardiovascular ROS: negative for - chest pain, dyspnea on exertion, edema or irregular heartbeat Gastrointestinal ROS: negative for - abdominal pain, diarrhea, hematemesis, nausea/vomiting or stool incontinence Genito-Urinary ROS: negative for - dysuria, hematuria, incontinence or urinary frequency/urgency Musculoskeletal ROS:  negative for - joint swelling or muscular weakness Neurological ROS: as noted in HPI Dermatological ROS: negative for rash and skin lesion changes   Physical exam: pleasant male in no apparent distress. Head: normocephalic. Neck: supple, no bruits, no JVD. Cardiac: no murmurs. Lungs: clear. Abdomen: soft, no tender, no mass. Extremities: no edema.  Blood pressure 172/98, pulse 69, temperature 98.2 F (36.8 C), temperature source Oral, resp. rate 16, height 6\' 1"  (1.854 m), weight 79.334 kg (174 lb 14.4 oz), SpO2 100.00%.   Neurologic Examination:                                                                                                      Mental Status: Alert, oriented, thought content appropriate.  Speech fluent without evidence of aphasia.  Able to follow 3 step commands without difficulty. Cranial Nerves: II: Discs flat bilaterally; There is right lower quadrant right visual field cut , pupils equal, round, reactive to light and accommodation III,IV, VI: ptosis not present, extra-ocular motions intact bilaterally V,VII: smile symmetric, facial light touch sensation normal bilaterally VIII: hearing normal bilaterally IX,X: gag reflex present XI: bilateral shoulder shrug XII: midline tongue extension Motor: Right : Upper extremity   5/5    Left:     Upper extremity   5/5  Lower extremity  5/5     Lower extremity   5/5 Tone and bulk:normal tone throughout; no atrophy noted Sensory: Pinprick and light touch intact throughout, bilaterally Deep Tendon Reflexes:  2 all over  Plantars: Right: downgoing   Left: downgoing Cerebellar: normal finger-to-nose,  normal heel-to-shin test Gait: No ataxia. CV: pulses palpable throughout    No results found for this basename: cbc, bmp, coags, chol, tri, ldl, hga1c    Results for orders placed during the hospital encounter of 04/21/13 (from the past 48 hour(s))  CBC WITH DIFFERENTIAL     Status: Abnormal   Collection Time     04/21/13  8:15 AM      Result Value Range   WBC 4.2  4.0 - 10.5 K/uL   RBC 5.00  4.22 - 5.81 MIL/uL   Hemoglobin 16.6  13.0 - 17.0 g/dL   HCT 40.9  81.1 - 91.4 %   MCV 88.2  78.0 - 100.0 fL   MCH 33.2  26.0 - 34.0 pg   MCHC 36.6 (*) 30.0 - 36.0 g/dL   Comment: CORRECTED FOR INTERFERING SUBSTANCE   RDW 12.7  11.5 - 15.5 %   Platelets 139 (*) 150 - 400 K/uL   Neutrophils Relative % 63  43 - 77 %   Neutro Abs 2.6  1.7 - 7.7 K/uL   Lymphocytes Relative 25  12 - 46 %   Lymphs Abs 1.1  0.7 - 4.0 K/uL   Monocytes Relative 11  3 - 12 %   Monocytes Absolute 0.5  0.1 - 1.0 K/uL   Eosinophils Relative 2  0 - 5 %   Eosinophils Absolute 0.1  0.0 - 0.7 K/uL   Basophils Relative 0  0 - 1 %   Basophils Absolute 0.0  0.0 - 0.1 K/uL  POCT I-STAT TROPONIN I     Status: None   Collection Time    04/21/13  8:27 AM      Result Value Range   Troponin i, poc 0.00  0.00 - 0.08 ng/mL   Comment 3            Comment: Due to the release kinetics of cTnI,     a negative result within the first hours     of the onset of symptoms does not rule out     myocardial infarction with certainty.     If myocardial infarction is still suspected,     repeat the test at appropriate intervals.  POCT I-STAT, CHEM 8     Status: Abnormal   Collection Time    04/21/13  8:29 AM      Result Value Range   Sodium 137  135 - 145 mEq/L   Potassium 4.0  3.5 - 5.1 mEq/L   Chloride 100  96 - 112 mEq/L   BUN 13  6 - 23 mg/dL   Creatinine, Ser 7.82  0.50 - 1.35 mg/dL   Glucose, Bld 956 (*) 70 - 99 mg/dL   Calcium, Ion 2.13  1.12 - 1.23 mmol/L   TCO2 24  0 - 100 mmol/L   Hemoglobin 16.0  13.0 - 17.0 g/dL   HCT 08.6  57.8 - 46.9 %  COMPREHENSIVE METABOLIC PANEL     Status: Abnormal   Collection Time    04/21/13 10:41 AM      Result Value Range   Sodium 137  135 - 145 mEq/L   Potassium 3.5  3.5 - 5.1 mEq/L   Chloride 100  96 -  112 mEq/L   CO2 27  19 - 32 mEq/L   Glucose, Bld 247 (*) 70 - 99 mg/dL   BUN 11  6 - 23 mg/dL    Creatinine, Ser 1.61  0.50 - 1.35 mg/dL   Calcium 9.6  8.4 - 09.6 mg/dL   Total Protein 6.7  6.0 - 8.3 g/dL   Albumin 4.0  3.5 - 5.2 g/dL   AST 29  0 - 37 U/L   ALT 28  0 - 53 U/L   Alkaline Phosphatase 122 (*) 39 - 117 U/L   Total Bilirubin 0.7  0.3 - 1.2 mg/dL   GFR calc non Af Amer >90  >90 mL/min   GFR calc Af Amer >90  >90 mL/min   Comment: (NOTE)     The eGFR has been calculated using the CKD EPI equation.     This calculation has not been validated in all clinical situations.     eGFR's persistently <90 mL/min signify possible Chronic Kidney     Disease.  GLUCOSE, CAPILLARY     Status: Abnormal   Collection Time    04/21/13 12:57 PM      Result Value Range   Glucose-Capillary 214 (*) 70 - 99 mg/dL  GLUCOSE, CAPILLARY     Status: Abnormal   Collection Time    04/21/13  4:48 PM      Result Value Range   Glucose-Capillary 469 (*) 70 - 99 mg/dL   Comment 1 Notify RN     Comment 2 Documented in Chart    GLUCOSE, CAPILLARY     Status: Abnormal   Collection Time    04/21/13  5:10 PM      Result Value Range   Glucose-Capillary 422 (*) 70 - 99 mg/dL    Dg Chest 2 View  0/45/4098   *RADIOLOGY REPORT*  Clinical Data: Near-syncope  CHEST - 2 VIEW  Comparison: None.  Findings: There is an apparent mass in the posterior segment left upper lobe measuring 4.8 x 2.7 x 3.2 cm in size.  There is postoperative change in the right upper lobe.  Elsewhere, lungs clear.  The heart size and pulmonary vascularity are normal.  No adenopathy.  No bone lesions.  IMPRESSION: Apparent mass posterior segment left upper lobe. Contrast enhanced chest CT advised to further assess. Postoperative change right lung.  Elsewhere, lungs appear clear.   Original Report Authenticated By: Bretta Bang, M.D.   Ct Head Wo Contrast  04/21/2013   *RADIOLOGY REPORT*  Clinical Data: Headache and near-syncope  CT HEAD WITHOUT CONTRAST  Technique:  Contiguous axial images were obtained from the base of the skull  through the vertex without contrast. Study was obtained within 24 hours of patient arrival at the emergency department.  Comparison: None.  Findings: The ventricles are normal in size and configuration. There is a mass-like area with increased attenuation, probably containing hemorrhage, arising in the left occipital lobe measuring 5.1 x 2.9 cm.  There is surrounding vasogenic edema.  There is a 5 mm focus of increased attenuation at the level of the third ventricle.  This finding may indicate a small focus of hemorrhage.  There is no other evidence of mass or hemorrhage.  There is no extra-axial fluid or midline shift.  Elsewhere gray-white compartments are normal.  Bony calvarium appears intact.  The mastoid air cells are clear.  IMPRESSION: Mass with vasogenic edema that appears to contain a degree of hemorrhage in the left occipital lobe.  A neoplastic  lesion is suspected.  MR correlation advised.  There is also an area of questionable hemorrhage along the superior aspect of the third ventricle.  Particular attention this area on MR also advised.   Original Report Authenticated By: Bretta Bang, M.D.   Ct Chest W Contrast  04/21/2013   *RADIOLOGY REPORT*  Clinical Data: Abnormal chest x-ray, history of throat cancer  CT CHEST WITH CONTRAST  Technique:  Multidetector CT imaging of the chest was performed following the standard protocol during bolus administration of intravenous contrast.  Contrast: 80mL OMNIPAQUE IOHEXOL 300 MG/ML  SOLN  Comparison: Chest x-ray same day  Findings: Sagittal images of the spine shows degenerative changes mid and lower thoracic spine.  No destructive bony lesions are noted.  Images of the thoracic inlet are unremarkable.  Central pulmonary artery is unremarkable.  No aortic aneurysm.  Atherosclerotic calcifications of coronary arteries are noted.  Heart size within normal limits.  There is irregular low density lesion within right hepatic lobe centrally measures 7.4 cm.  Low  density lesion in the right hepatic lobe anterior inferiorly chest inferior to gallbladder measures 4.6 cm.  This are highly suspicious for metastatic disease.  There is low density lesion anterior aspect of the spleen measures at least 4 cm.  This is highly suspicious for metastatic disease.  No adrenal gland mass is noted.  Layering small gallstones are noted within gallbladder.  As noted on chest x-ray there is a nodular consolidation in the left upper lobe with some central air bronchogram.  Measures at least 3.3 x 3 cm.  This is highly suspicious for primary or secondary malignancy.  Further evaluation with bronchoscopy and/or biopsy is recommended.  There are postsurgical changes in the right upper lobe.  There is spiculated central scarring in the right upper lobe measures about 2 x 1.8 cm.  No hilar or mediastinal adenopathy.  Central airways are patent.  IMPRESSION:  1.  There is  nodular consolidation with some central air bronchogram in the left upper lobe measures at least 3.3 x 3 cm. This is highly suspicious for primary or secondary malignancy. Further evaluation with bronchoscopy and/or biopsy is recommended. 2.  Postsurgical changes are noted in the right upper lobe. Spiculated scarring in the right upper lobe measures 2 x 1.8 cm. 3.  No adenopathy. 4.  There are low density lesions within liver the largest in right hepatic lobe centrally measures 7.4 cm.  Low density lesion with anterior aspect of the spleen measures 4 cm.  This are highly suspicious for metastatic disease.  Further evaluation is recommended. 5.  Small layering gallstones are noted within gallbladder.  No adrenal gland mass is noted.  Degenerative changes mid and lower thoracic spine.  Critical findings discussed with Dr.Pickering.   Original Report Authenticated By: Natasha Mead, M.D.   Mr Laqueta Jean ZO Contrast  04/21/2013   *RADIOLOGY REPORT*  Clinical Data:  Eye pain.  Brain mass.  Visual problems.  Syncope.  MRI HEAD AND ORBITS  WITHOUT AND WITH CONTRAST  Technique:  Multiplanar, multiecho pulse sequences of the brain and surrounding structures were obtained without and with intravenous contrast. Multiplanar, multiecho pulse sequences of the orbits and surrounding structures were obtained including fat saturation techniques, before and after intravenous contrast administration.  Contrast: MultiHance 17 ml.  Comparison:  CT head 04/21/2013.  MRI HEAD  Findings:  A left occipital mass is redemonstrated, with enhancing cross-sectional measurements of 46 x 32 x 37 mm ( (R-L x A-P x C-  C)).  The lesion appears extra-axial.  The lesion has intermediate signal on T1 and intermediate to low signal on T2-weighted images. Mild restricted diffusion.  Inward displacement of cortical vessels.  Dural tail on postcontrast imaging.  Slight central necrosis.  No adjacent hyperostosis of the calvarium.  No invasion of the adjacent venous sinuses.  Overall constellation of findings consistent with a left occipital meningioma.  Moderate associated vasogenic edema.  No intrinsic white matter disease. No acute stroke or hemorrhage.  No hydrocephalus or extra- axial fluid.  In the anterior third ventricle, near the foramen of Monro, there is a 4 mm solid lesion corresponding with the hyperdense abnormality noted on CT.  Mild post contrast enhancement. Considerations would include a second meningioma (intraventricular),  a small colloid cyst, or subependymoma.  There is no associated edema or incipient ventricular obstruction.  Flow voids are maintained in the major intracranial vessels. Calvarium appears intact. Sinus or mastoid disease.  IMPRESSION: 46 x 32 x 37 mm left occipital extra-axial mass with associated edema but no midline shift.  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 See discussion above.  MRI ORBITS  Findings: The globes are symmetric and within normal  limits. Normal appearing optic nerves.  Extraocular muscles intact.  No preseptal or post-septal mass.  No significant paranasal sinus disease.  Normal orbital fat.  No abnormal post contrast enhancement on fat saturated images.  IMPRESSION: Negative MRI of the orbits without and with contrast.   Original Report Authenticated By: Davonna Belling, M.D.   Mr Orbits Wo/w Cm  04/21/2013   *RADIOLOGY REPORT*  Clinical Data:  Eye pain.  Brain mass.  Visual problems.  Syncope.  MRI HEAD AND ORBITS WITHOUT AND WITH CONTRAST  Technique:  Multiplanar, multiecho pulse sequences of the brain and surrounding structures were obtained without and with intravenous contrast. Multiplanar, multiecho pulse sequences of the orbits and surrounding structures were obtained including fat saturation techniques, before and after intravenous contrast administration.  Contrast: MultiHance 17 ml.  Comparison:  CT head 04/21/2013.  MRI HEAD  Findings:  A left occipital mass is redemonstrated, with enhancing cross-sectional measurements of 46 x 32 x 37 mm ( (R-L x A-P x C- C)).  The lesion appears extra-axial.  The lesion has intermediate signal on T1 and intermediate to low signal on T2-weighted images. Mild restricted diffusion.  Inward displacement of cortical vessels.  Dural tail on postcontrast imaging.  Slight central necrosis.  No adjacent hyperostosis of the calvarium.  No invasion of the adjacent venous sinuses.  Overall constellation of findings consistent with a left occipital meningioma.  Moderate associated vasogenic edema.  No intrinsic white matter disease. No acute stroke or hemorrhage.  No hydrocephalus or extra- axial fluid.  In the anterior third ventricle, near the foramen of Monro, there is a 4 mm solid lesion corresponding with the hyperdense abnormality noted on CT.  Mild post contrast enhancement. Considerations would include a second meningioma (intraventricular),  a small colloid cyst, or subependymoma.  There is no  associated edema or incipient ventricular obstruction.  Flow voids are maintained in the major intracranial vessels. Calvarium appears intact. Sinus or mastoid disease.  IMPRESSION: 46 x 32 x 37 mm left occipital extra-axial mass with associated edema but no midline shift.  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 See discussion above.  MRI ORBITS  Findings: The globes are  symmetric and within normal limits. Normal appearing optic nerves.  Extraocular muscles intact.  No preseptal or post-septal mass.  No significant paranasal sinus disease.  Normal orbital fat.  No abnormal post contrast enhancement on fat saturated images.  IMPRESSION: Negative MRI of the orbits without and with contrast.   Original Report Authenticated By: Davonna Belling, M.D.     Assessment/Plan: 52 y/o with chronic HA and more recent onset of impaired vision. Neuro-exam significant only for right lower quadrant visual files cut. MRI brain revealed a 46 x 32 x 37 mm left occipital extra-axial mass with associated edema but no midline shift consistent with a meningioma as well as a 4 mm intraventricular lesion anterior third ventricle suggestive of a small subependymoma. Will suggest neurosurgery evaluation. Decadron 4 mg BID. Will follow up   Addendum: neurosurgery has evaluated patient and is going forward with surgery --neurology will S/O   Wyatt Portela, MD [facility] [phone]  04/21/2013, 6:01 PM

## 2013-04-21 NOTE — H&P (Signed)
Family Medicine Teaching [facility] Admission History and Physical Service Pager: [phone]  Patient name: William Townsend Medical record number: [id number] Date of birth: [birth year] Age: 52 y.o. Gender: male  Primary Care Provider: Clare Gandy, MD Consultants: Neurology/Neurosurgery  Code Status: DNR  Chief Complaint: Visual changes with headaches  Assessment and Plan: William Townsend is a 52 y.o. year old male presenting with right sided visual changes the last two days and headaches for past 2 months. PMH is significant for throat and lung cancer, HTN and diabetes.  CT of head with occipital mass and xray with lung mass.   Headaches: Headaches for 2 months. Apparent mass posterior segment left upper lobe of lung.  CT of head with Mass with vasogenic edema that appears to contain a degree of hemorrhage in the left occipital lobe.  A neoplastic lesion is suspected. There is also an area of questionable hemorrhage along the superior aspect of the third ventricle.  Patient currently without chest symptoms and P02 stable on RA - Pain: 1 mg morphine Q3h, Norco 10 Q6PRN - Neurology will need to be called - MRI of brain and orbits ordered.  - CT of chest ordered.   HTN: Patient has been prescribed hypertensive medication s in the past and then removed from them d/t hypotension.  - Will continue to monitor. Currently BP is high. Ordered Hydralazine with parameters. BP had been normal in office a few weeks ago, concern these readings are d/t to pain and anxiety over hospitalization.  - Will monitor and order daily medication if warranted after pain control   Diabetes: Has had a history of diabetes. Has been without medications d/t financial reasons.  - Carb modified diet - A1c today - SSI Q meal AC/HS  H/o of arrhythmia: Uncertain of history of arrhythmia, amiodarone had been listed in his medications of the past and he reports having had an "irregular heart rate" when asked.  - EKG  ordered - Admitted to telemetry unit.  Constipation: Patient reports this has been an issue since he was in the shelter. May need image study to rule out metastasis.  - Senakot and Miralax ordered  FEN/GI: Carb modified diet Prophylaxis: No Hep d/t CT results of brain. SCD's ordered for now.   Disposition: Pending further studies and  Neurology consult.   History of Present Illness: William Townsend is a 52 y.o. year old male presenting with right sided visual changes for 2 days.  He reports his vision is getting "weird."  He reports he was walking downtown 2 days ago and was not seeing cars in his periphery that he was hearing. He has been feeling "cross eyed" making his headache worse. Had a appointment yesterday and did not make it because when he got out of bed things got black and swirled around he was fearful to walk a couple miles because he was afraid to walk. This morning the same thing happen and he came into the ED. His headaches have been present for two months mild to moderate pain, since his office visit he states they are getting worse. Vicodin helps some, but does not take the bulk of the pain. Did not come back into the office to be seen because he was waiting for orange card to be approved. He has been experiencing a great deal of stress over the medical bills. Currently he is 7/10 head pain with left eye pain. Has been experiencing some "slow to start urination" and constipation. He denies chest  pain, shortness of breath  Or cough. Denies weight loss.  He smokes: 1/2 pack a day  (34 years). Drugs: Smokes marijuana sometimes. Drinks: 3 beers a night Has had a h/o of HTN, and diabetes, he has not been able to afford diabetic medications. Currently lives with a friend. Use to live in a shelter until about a year ago. He has a significant history of Throat Cancer (2008) --> radiation and chemo. Also has had Lung cancer (2010) --> lobectomy and chemo.  Finished chemo in September 2010. PET  scan October 2010 (clear). All treatment and work-up has been in [place] (cost[place] with  Dr. Audria Nine oncologist).   Review Of Systems: Per HPI  Otherwise 12 point review of systems was performed and was unremarkable.  Patient Active Problem List   Diagnosis Date Noted  . Tobacco abuse 04/03/2013  . Headache(784.0) 04/02/2013  . Diabetes mellitus, type 2 01/28/2012  . Essential hypertension, benign 01/28/2012  . History of stroke 01/28/2012  . History of alcohol abuse 01/28/2012  . History of lung cancer 01/28/2012  . History of throat cancer 01/28/2012  . Allergic rhinitis 01/28/2012  . Chronic pain of multiple joints 01/28/2012   Past Medical History: Past Medical History  Diagnosis Date  . Atrial fibrillation   . Diabetes mellitus   . 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 Allergy   . Substance abuse   . Depression   . Stroke     3 previous strokes - Oct, Nov, and Dec 2012  . Throat cancer     September 2008  . Lung cancer     lobectomy in 2010  . Heart murmur    Past Surgical History: Past Surgical History  Procedure Laterality Date  . Lobectomy     Social History: History  Substance Use Topics  . Smoking status: Current Every Day Smoker -- 0.50 packs/day for 30 years    Types: Cigarettes  . Smokeless tobacco: Never Used  . Alcohol Use: 0.0 oz/week    1-2 Cans of beer per week     Comment: couple beers at night   Additional social history: History  Of ETOH and tobacco abuse.  Please also refer to relevant sections of EMR.  Family History: Family History  Problem Relation Age of Onset  . Diabetes Mother   . Early death Mother   . Heart disease Father   . Stroke Father   . Asthma Sister   . Depression Sister    Allergies and Medications: No Known Allergies No current facility-administered medications on file prior to encounter.   No current outpatient prescriptions on file prior to encounter.    Objective: BP 144/108  Pulse 66  Temp(Src)  97.9 F (36.6 C) (Oral)  Resp 19  SpO2 98% Exam: General: NAD. Concerned, pleasant male. HEENT: AT.[place]. Bilateral eyes mildly icteric. Bilateral TM with neutral position, gray and translucent. Bilateral nares with erythema. MMM. Throat: Mildly erythema. Neck: Supple. No LAD or thyroid megaly. Trachea midline.  Cardiovascular: RRR. Mild 1/6 SM.  Respiratory: CTAB. No wheeze, rhonchi or rales Abdomen: Soft. NTND. BS present. No masses or hepatosplenomegaly noted.  Extremities: No erythema or edema.  Skin: No rashes present.  Neuro: Alert. Oriented. Visual deficit right lateral/superior visual field. Otherwise CN 2-12 grossly intact. Negative pronator drift. Romberg with mild dizziness. Finger to nose and shin to shin test normal.  Labs and Imaging: CBC BMET   Recent Labs Lab 04/21/13 0829  HGB 16.0  HCT  47.0    Recent Labs Lab 04/21/13 0829  NA 137  K 4.0  CL 100  BUN 13  CREATININE 1.00  GLUCOSE 305*     Dg Chest 2 View  04/21/2013   *RADIOLOGY REPORT*  Clinical Data: Near-syncope  CHEST - 2 VIEW  Comparison: None.  Findings: There is an apparent mass in the posterior segment left upper lobe measuring 4.8 x 2.7 x 3.2 cm in size.  There is postoperative change in the right upper lobe.  Elsewhere, lungs clear.  The heart size and pulmonary vascularity are normal.  No adenopathy.  No bone lesions.  IMPRESSION: Apparent mass posterior segment left upper lobe. Contrast enhanced chest CT advised to further assess. Postoperative change right lung.  Elsewhere, lungs appear clear.   Original Report Authenticated By: Bretta Bang, M.D.   Ct Head Wo Contrast  04/21/2013   *RADIOLOGY REPORT*  Clinical Data: Headache and near-syncope  CT HEAD WITHOUT CONTRAST  Technique:  Contiguous axial images were obtained from the base of the skull through the vertex without contrast. Study was obtained within 24 hours of patient arrival at the emergency department.  Comparison: None.  Findings: The  ventricles are normal in size and configuration. There is a mass-like area with increased attenuation, probably containing hemorrhage, arising in the left occipital lobe measuring 5.1 x 2.9 cm.  There is surrounding vasogenic edema.  There is a 5 mm focus of increased attenuation at the level of the third ventricle.  This finding may indicate a small focus of hemorrhage.  There is no other evidence of mass or hemorrhage.  There is no extra-axial fluid or midline shift.  Elsewhere gray-white compartments are normal.  Bony calvarium appears intact.  The mastoid air cells are clear.  IMPRESSION: Mass with vasogenic edema that appears to contain a degree of hemorrhage in the left occipital lobe.  A neoplastic lesion is suspected.  MR correlation advised.  There is also an area of questionable hemorrhage along the superior aspect of the third ventricle.  Particular attention this area on MR also advised.   Original Report Authenticated By: Bretta Bang, M.D.     Natalia Leatherwood, DO 04/21/2013, 9:02 AM PGY-2, [facility] FPTS Intern pager: [phone], text pages welcome

## 2013-04-21 NOTE — ED Notes (Signed)
Pt. States that he has been having headaches for a while. He saw his PCP recently and was told he had allergies. States he was getting up out of bed this am and felt lightheaded and dizzy and his vision became "blacked-out" and he had to sit back down. He was OK after he sat there for 5 minutes. No LOC.

## 2013-04-21 NOTE — Consult Note (Signed)
Reason for Consult:brain tumor Referring Physician: hospitalist  William Townsend is an 52 y.o. male.  HPI: patient admitted with the history of headache for sevaral months and for the past ten days double vision and blurriness with the right eye. No weakness or seizures, ct and mri of the brain were done  Past Medical History  Diagnosis Date  . Atrial fibrillation   . Diabetes mellitus   . 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 Allergy   . Substance abuse   . Depression   . Stroke     3 previous strokes - Oct, Nov, and Dec 2012  . Throat cancer     September 2008  . Lung cancer     lobectomy in 2010  . Heart murmur     Past Surgical History  Procedure Laterality Date  . Lobectomy      Family History  Problem Relation Age of Onset  . Diabetes Mother   . Early death Mother   . Heart disease Father   . Stroke Father   . Asthma Sister   . Depression Sister     Social History:  reports that he has been smoking Cigarettes.  He has a 15 pack-year smoking history. He has never used smokeless tobacco. He reports that  drinks alcohol. He reports that he uses illicit drugs (Marijuana).  Allergies: No Known Allergies   Results for orders placed during the hospital encounter of 04/21/13 (from the past 48 hour(s))  CBC WITH DIFFERENTIAL     Status: Abnormal   Collection Time    04/21/13  8:15 AM      Result Value Range   WBC 4.2  4.0 - 10.5 K/uL   RBC 5.00  4.22 - 5.81 MIL/uL   Hemoglobin 16.6  13.0 - 17.0 g/dL   HCT 16.1  09.6 - 04.5 %   MCV 88.2  78.0 - 100.0 fL   MCH 33.2  26.0 - 34.0 pg   MCHC 36.6 (*) 30.0 - 36.0 g/dL   Comment: CORRECTED FOR INTERFERING SUBSTANCE   RDW 12.7  11.5 - 15.5 %   Platelets 139 (*) 150 - 400 K/uL   Neutrophils Relative % 63  43 - 77 %   Neutro Abs 2.6  1.7 - 7.7 K/uL   Lymphocytes Relative 25  12 - 46 %   Lymphs Abs 1.1  0.7 - 4.0 K/uL   Monocytes Relative 11  3 - 12 %   Monocytes Absolute 0.5  0.1 - 1.0 K/uL   Eosinophils Relative 2  0 - 5  %   Eosinophils Absolute 0.1  0.0 - 0.7 K/uL   Basophils Relative 0  0 - 1 %   Basophils Absolute 0.0  0.0 - 0.1 K/uL  POCT I-STAT TROPONIN I     Status: None   Collection Time    04/21/13  8:27 AM      Result Value Range   Troponin i, poc 0.00  0.00 - 0.08 ng/mL   Comment 3            Comment: Due to the release kinetics of cTnI,     a negative result within the first hours     of the onset of symptoms does not rule out     myocardial infarction with certainty.     If myocardial infarction is still suspected,     repeat the test at appropriate intervals.  POCT I-STAT, CHEM 8     Status:  Abnormal   Collection Time    04/21/13  8:29 AM      Result Value Range   Sodium 137  135 - 145 mEq/L   Potassium 4.0  3.5 - 5.1 mEq/L   Chloride 100  96 - 112 mEq/L   BUN 13  6 - 23 mg/dL   Creatinine, Ser 1.61  0.50 - 1.35 mg/dL   Glucose, Bld 096 (*) 70 - 99 mg/dL   Calcium, Ion 0.45  1.12 - 1.23 mmol/L   TCO2 24  0 - 100 mmol/L   Hemoglobin 16.0  13.0 - 17.0 g/dL   HCT 40.9  81.1 - 91.4 %  COMPREHENSIVE METABOLIC PANEL     Status: Abnormal   Collection Time    04/21/13 10:41 AM      Result Value Range   Sodium 137  135 - 145 mEq/L   Potassium 3.5  3.5 - 5.1 mEq/L   Chloride 100  96 - 112 mEq/L   CO2 27  19 - 32 mEq/L   Glucose, Bld 247 (*) 70 - 99 mg/dL   BUN 11  6 - 23 mg/dL   Creatinine, Ser 7.82  0.50 - 1.35 mg/dL   Calcium 9.6  8.4 - 95.6 mg/dL   Total Protein 6.7  6.0 - 8.3 g/dL   Albumin 4.0  3.5 - 5.2 g/dL   AST 29  0 - 37 U/L   ALT 28  0 - 53 U/L   Alkaline Phosphatase 122 (*) 39 - 117 U/L   Total Bilirubin 0.7  0.3 - 1.2 mg/dL   GFR calc non Af Amer >90  >90 mL/min   GFR calc Af Amer >90  >90 mL/min   Comment: (NOTE)     The eGFR has been calculated using the CKD EPI equation.     This calculation has not been validated in all clinical situations.     eGFR's persistently <90 mL/min signify possible Chronic Kidney     Disease.  GLUCOSE, CAPILLARY     Status: Abnormal    Collection Time    04/21/13 12:57 PM      Result Value Range   Glucose-Capillary 214 (*) 70 - 99 mg/dL  GLUCOSE, CAPILLARY     Status: Abnormal   Collection Time    04/21/13  4:48 PM      Result Value Range   Glucose-Capillary 469 (*) 70 - 99 mg/dL   Comment 1 Notify RN     Comment 2 Documented in Chart    GLUCOSE, CAPILLARY     Status: Abnormal   Collection Time    04/21/13  5:10 PM      Result Value Range   Glucose-Capillary 422 (*) 70 - 99 mg/dL  BASIC METABOLIC PANEL     Status: Abnormal   Collection Time    04/21/13  6:15 PM      Result Value Range   Sodium 128 (*) 135 - 145 mEq/L   Potassium 5.0  3.5 - 5.1 mEq/L   Chloride 91 (*) 96 - 112 mEq/L   CO2 27  19 - 32 mEq/L   Glucose, Bld 549 (*) 70 - 99 mg/dL   BUN 12  6 - 23 mg/dL   Creatinine, Ser 2.13  0.50 - 1.35 mg/dL   Calcium 9.5  8.4 - 08.6 mg/dL   GFR calc non Af Amer >90  >90 mL/min   GFR calc Af Amer >90  >90 mL/min   Comment: (NOTE)  The eGFR has been calculated using the CKD EPI equation.     This calculation has not been validated in all clinical situations.     eGFR's persistently <90 mL/min signify possible Chronic Kidney     Disease.  GLUCOSE, CAPILLARY     Status: Abnormal   Collection Time    04/21/13  7:00 PM      Result Value Range   Glucose-Capillary 402 (*) 70 - 99 mg/dL    Dg Chest 2 View  0/98/1191   *RADIOLOGY REPORT*  Clinical Data: Near-syncope  CHEST - 2 VIEW  Comparison: None.  Findings: There is an apparent mass in the posterior segment left upper lobe measuring 4.8 x 2.7 x 3.2 cm in size.  There is postoperative change in the right upper lobe.  Elsewhere, lungs clear.  The heart size and pulmonary vascularity are normal.  No adenopathy.  No bone lesions.  IMPRESSION: Apparent mass posterior segment left upper lobe. Contrast enhanced chest CT advised to further assess. Postoperative change right lung.  Elsewhere, lungs appear clear.   Original Report Authenticated By: Bretta Bang,  M.D.   Ct Head Wo Contrast  04/21/2013   *RADIOLOGY REPORT*  Clinical Data: Headache and near-syncope  CT HEAD WITHOUT CONTRAST  Technique:  Contiguous axial images were obtained from the base of the skull through the vertex without contrast. Study was obtained within 24 hours of patient arrival at the emergency department.  Comparison: None.  Findings: The ventricles are normal in size and configuration. There is a mass-like area with increased attenuation, probably containing hemorrhage, arising in the left occipital lobe measuring 5.1 x 2.9 cm.  There is surrounding vasogenic edema.  There is a 5 mm focus of increased attenuation at the level of the third ventricle.  This finding may indicate a small focus of hemorrhage.  There is no other evidence of mass or hemorrhage.  There is no extra-axial fluid or midline shift.  Elsewhere gray-white compartments are normal.  Bony calvarium appears intact.  The mastoid air cells are clear.  IMPRESSION: Mass with vasogenic edema that appears to contain a degree of hemorrhage in the left occipital lobe.  A neoplastic lesion is suspected.  MR correlation advised.  There is also an area of questionable hemorrhage along the superior aspect of the third ventricle.  Particular attention this area on MR also advised.   Original Report Authenticated By: Bretta Bang, M.D.   Ct Chest W Contrast  04/21/2013   *RADIOLOGY REPORT*  Clinical Data: Abnormal chest x-ray, history of throat cancer  CT CHEST WITH CONTRAST  Technique:  Multidetector CT imaging of the chest was performed following the standard protocol during bolus administration of intravenous contrast.  Contrast: 80mL OMNIPAQUE IOHEXOL 300 MG/ML  SOLN  Comparison: Chest x-ray same day  Findings: Sagittal images of the spine shows degenerative changes mid and lower thoracic spine.  No destructive bony lesions are noted.  Images of the thoracic inlet are unremarkable.  Central pulmonary artery is unremarkable.  No  aortic aneurysm.  Atherosclerotic calcifications of coronary arteries are noted.  Heart size within normal limits.  There is irregular low density lesion within right hepatic lobe centrally measures 7.4 cm.  Low density lesion in the right hepatic lobe anterior inferiorly chest inferior to gallbladder measures 4.6 cm.  This are highly suspicious for metastatic disease.  There is low density lesion anterior aspect of the spleen measures at least 4 cm.  This is highly suspicious for metastatic disease.  No adrenal gland mass is noted.  Layering small gallstones are noted within gallbladder.  As noted on chest x-ray there is a nodular consolidation in the left upper lobe with some central air bronchogram.  Measures at least 3.3 x 3 cm.  This is highly suspicious for primary or secondary malignancy.  Further evaluation with bronchoscopy and/or biopsy is recommended.  There are postsurgical changes in the right upper lobe.  There is spiculated central scarring in the right upper lobe measures about 2 x 1.8 cm.  No hilar or mediastinal adenopathy.  Central airways are patent.  IMPRESSION:  1.  There is  nodular consolidation with some central air bronchogram in the left upper lobe measures at least 3.3 x 3 cm. This is highly suspicious for primary or secondary malignancy. Further evaluation with bronchoscopy and/or biopsy is recommended. 2.  Postsurgical changes are noted in the right upper lobe. Spiculated scarring in the right upper lobe measures 2 x 1.8 cm. 3.  No adenopathy. 4.  There are low density lesions within liver the largest in right hepatic lobe centrally measures 7.4 cm.  Low density lesion with anterior aspect of the spleen measures 4 cm.  This are highly suspicious for metastatic disease.  Further evaluation is recommended. 5.  Small layering gallstones are noted within gallbladder.  No adrenal gland mass is noted.  Degenerative changes mid and lower thoracic spine.  Critical findings discussed with  Dr.Pickering.   Original Report Authenticated By: Natasha Mead, M.D.   Mr Laqueta Jean UJ Contrast  04/21/2013   *RADIOLOGY REPORT*  Clinical Data:  Eye pain.  Brain mass.  Visual problems.  Syncope.  MRI HEAD AND ORBITS WITHOUT AND WITH CONTRAST  Technique:  Multiplanar, multiecho pulse sequences of the brain and surrounding structures were obtained without and with intravenous contrast. Multiplanar, multiecho pulse sequences of the orbits and surrounding structures were obtained including fat saturation techniques, before and after intravenous contrast administration.  Contrast: MultiHance 17 ml.  Comparison:  CT head 04/21/2013.  MRI HEAD  Findings:  A left occipital mass is redemonstrated, with enhancing cross-sectional measurements of 46 x 32 x 37 mm ( (R-L x A-P x C- C)).  The lesion appears extra-axial.  The lesion has intermediate signal on T1 and intermediate to low signal on T2-weighted images. Mild restricted diffusion.  Inward displacement of cortical vessels.  Dural tail on postcontrast imaging.  Slight central necrosis.  No adjacent hyperostosis of the calvarium.  No invasion of the adjacent venous sinuses.  Overall constellation of findings consistent with a left occipital meningioma.  Moderate associated vasogenic edema.  No intrinsic white matter disease. No acute stroke or hemorrhage.  No hydrocephalus or extra- axial fluid.  In the anterior third ventricle, near the foramen of Monro, there is a 4 mm solid lesion corresponding with the hyperdense abnormality noted on CT.  Mild post contrast enhancement. Considerations would include a second meningioma (intraventricular),  a small colloid cyst, or subependymoma.  There is no associated edema or incipient ventricular obstruction.  Flow voids are maintained in the major intracranial vessels. Calvarium appears intact. Sinus or mastoid disease.  IMPRESSION: 46 x 32 x 37 mm left occipital extra-axial mass with associated edema but no midline shift.  Findings  consistent with a left occipital meningioma.  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 See discussion above.  MRI ORBITS  Findings: The globes are symmetric and within normal limits. Normal appearing optic nerves.  Extraocular muscles intact.  No preseptal or post-septal mass.  No significant paranasal sinus disease.  Normal orbital fat.  No abnormal post contrast enhancement on fat saturated images.  IMPRESSION: Negative MRI of the orbits without and with contrast.   Original Report Authenticated By: Davonna Belling, M.D.   Mr Orbits Wo/w Cm  04/21/2013   *RADIOLOGY REPORT*  Clinical Data:  Eye pain.  Brain mass.  Visual problems.  Syncope.  MRI HEAD AND ORBITS WITHOUT AND WITH CONTRAST  Technique:  Multiplanar, multiecho pulse sequences of the brain and surrounding structures were obtained without and with intravenous contrast. Multiplanar, multiecho pulse sequences of the orbits and surrounding structures were obtained including fat saturation techniques, before and after intravenous contrast administration.  Contrast: MultiHance 17 ml.  Comparison:  CT head 04/21/2013.  MRI HEAD  Findings:  A left occipital mass is redemonstrated, with enhancing cross-sectional measurements of 46 x 32 x 37 mm ( (R-L x A-P x C- C)).  The lesion appears extra-axial.  The lesion has intermediate signal on T1 and intermediate to low signal on T2-weighted images. Mild restricted diffusion.  Inward displacement of cortical vessels.  Dural tail on postcontrast imaging.  Slight central necrosis.  No adjacent hyperostosis of the calvarium.  No invasion of the adjacent venous sinuses.  Overall constellation of findings consistent with a left occipital meningioma.  Moderate associated vasogenic edema.  No intrinsic white matter disease. No acute stroke or hemorrhage.  No hydrocephalus or extra- axial fluid.  In the anterior third ventricle, near the foramen of Monro, there is a 4 mm solid lesion  corresponding with the hyperdense abnormality noted on CT.  Mild post contrast enhancement. Considerations would include a second meningioma (intraventricular),  a small colloid cyst, or subependymoma.  There is no associated edema or incipient ventricular obstruction.  Flow voids are maintained in the major intracranial vessels. Calvarium appears intact. Sinus or mastoid disease.  IMPRESSION: 46 x 32 x 37 mm left occipital extra-axial mass with associated edema but no midline shift.  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 See discussion above.  MRI ORBITS  Findings: The globes are symmetric and within normal limits. Normal appearing optic nerves.  Extraocular muscles intact.  No preseptal or post-septal mass.  No significant paranasal sinus disease.  Normal orbital fat.  No abnormal post contrast enhancement on fat saturated images.  IMPRESSION: Negative MRI of the orbits without and with contrast.   Original Report Authenticated By: Davonna Belling, M.D.    Review of Systems  Constitutional: Negative.   HENT: Positive for sore throat.   Eyes: Positive for blurred vision and double vision.  Respiratory: Negative.        Cancer lung and throat  Gastrointestinal: Positive for abdominal pain.       Liver disease  Genitourinary: Negative.   Musculoskeletal: Negative.   Skin: Negative.   Neurological: Positive for headaches.  Endo/Heme/Allergies:       DM  Psychiatric/Behavioral: Negative.    Blood pressure 114/67, pulse 74, temperature 97.2 F (36.2 C), temperature source Oral, resp. rate 18, height 6\' 1"  (1.854 m), weight 79.334 kg (174 lb 14.4 oz), SpO2 96.00%. Physical Exam hent, nl. Neck, nl. Lungs, scar from lobectomy. ronchii bilaterally. Cv, nl. Abdomen, no masses. Extremities, nl. NEURO ORIENTED X 3. Cn normal except for right side file cuts. Pupils err. Strength, nl, dtr, nl. Mri, shows a large left  occipital tumor most likely a meningioma. The dural  sinuses seem to be open. There is a second lesion in the third ventricle which is independent and could be a subependymoma. No hydrocephalus is present   Assessment/Plan: i did talk with the patient about the findings and he agrees with surgery to resect the occipital tumor . He needs to have his DM under better control before surgery. The 3rd ventricle mass needs to be f/u . On decadron to control the vasogenic edema  Skyanne Welle M 04/21/2013, 11:21 PM

## 2013-04-21 NOTE — ED Notes (Signed)
Pt returned from xray. Placed back on monitor. VSS.

## 2013-04-21 NOTE — ED Notes (Signed)
Pt remain in CT. Unable to give meds.

## 2013-04-22 DIAGNOSIS — I10 Essential (primary) hypertension: Secondary | ICD-10-CM

## 2013-04-22 DIAGNOSIS — Z8673 Personal history of transient ischemic attack (TIA), and cerebral infarction without residual deficits: Secondary | ICD-10-CM

## 2013-04-22 DIAGNOSIS — F1011 Alcohol abuse, in remission: Secondary | ICD-10-CM

## 2013-04-22 DIAGNOSIS — E119 Type 2 diabetes mellitus without complications: Secondary | ICD-10-CM

## 2013-04-22 LAB — GLUCOSE, CAPILLARY
Glucose-Capillary: 406 mg/dL — ABNORMAL HIGH (ref 70–99)
Glucose-Capillary: 302 mg/dL — ABNORMAL HIGH (ref 70–99)

## 2013-04-22 LAB — HEMOGLOBIN A1C
Hgb A1c MFr Bld: 8.7 % — ABNORMAL HIGH (ref <5.7)
Mean Plasma Glucose: 203 mg/dL — ABNORMAL HIGH (ref <117)

## 2013-04-22 MED ORDER — NICOTINE 21 MG/24HR TD PT24: 21.0000 mg | MEDICATED_PATCH | Freq: Every day | TRANSDERMAL | Status: DC

## 2013-04-22 MED ORDER — NICOTINE 21 MG/24HR TD PT24
21.0000 mg | MEDICATED_PATCH | Freq: Every day | TRANSDERMAL | Status: DC
  Administered 2013-04-22 – 2013-05-01 (×9): 21 mg via TRANSDERMAL
  Filled 2013-04-22 (×10): qty 1

## 2013-04-22 MED ORDER — INSULIN ASPART 100 UNIT/ML ~~LOC~~ SOLN: 0.0000 [IU] | Freq: Every day | SUBCUTANEOUS | Status: DC

## 2013-04-22 MED ORDER — INSULIN ASPART 100 UNIT/ML ~~LOC~~ SOLN
0.0000 [IU] | Freq: Three times a day (TID) | SUBCUTANEOUS | Status: DC
  Administered 2013-04-22: 15 [IU] via SUBCUTANEOUS
  Administered 2013-04-22: 20 [IU] via SUBCUTANEOUS
  Administered 2013-04-23: 7 [IU] via SUBCUTANEOUS
  Administered 2013-04-23: 11 [IU] via SUBCUTANEOUS
  Administered 2013-04-23: 20 [IU] via SUBCUTANEOUS
  Administered 2013-04-24: 4 [IU] via SUBCUTANEOUS
  Administered 2013-04-24: 15 [IU] via SUBCUTANEOUS
  Administered 2013-04-24: 20 [IU] via SUBCUTANEOUS
  Administered 2013-04-25 (×2): 11 [IU] via SUBCUTANEOUS
  Administered 2013-04-25: 20 [IU] via SUBCUTANEOUS
  Administered 2013-04-26: 19:00:00 via SUBCUTANEOUS
  Administered 2013-04-26: 15 [IU] via SUBCUTANEOUS
  Administered 2013-04-26: 11 [IU] via SUBCUTANEOUS
  Administered 2013-04-27: 20 [IU] via SUBCUTANEOUS
  Administered 2013-04-27: 3 [IU] via SUBCUTANEOUS
  Administered 2013-04-28: 7 [IU] via SUBCUTANEOUS
  Administered 2013-04-28: 15 [IU] via SUBCUTANEOUS
  Administered 2013-04-28: 11 [IU] via SUBCUTANEOUS
  Administered 2013-04-29: 7 [IU] via SUBCUTANEOUS
  Administered 2013-04-29 – 2013-05-01 (×5): 4 [IU] via SUBCUTANEOUS

## 2013-04-22 MED ORDER — INSULIN DETEMIR 100 UNIT/ML ~~LOC~~ SOLN
10.0000 [IU] | Freq: Every day | SUBCUTANEOUS | Status: DC
  Administered 2013-04-22: 10 [IU] via SUBCUTANEOUS
  Filled 2013-04-22 (×2): qty 0.1

## 2013-04-22 NOTE — Progress Notes (Signed)
Family Medicine Teaching Service Daily Progress Note Intern Pager: [phone]  Patient name: William Townsend Medical record number: [id number] Date of birth: 1960/11/14 Age: 52 y.o. Gender: male  Primary Care Provider: Clare Gandy, MD Consultants: Neurology, Neurosurgery Code Status: DNR  Pt Overview and Major Events to Date:  8/14 - Neurosurgery plans to surgically resect occipital tumor pending improved DM control  Assessment and Plan:  [name] is a 52 y.o. year old male presenting with right sided visual changes the last two days and headaches for past 2 months. PMH is significant for throat and lung cancer, HTN and diabetes. CT of head with occipital mass and xray with lung mass.  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 Consistent with occipital meningioma MRI Orbits - Negative MRI of the orbits without and with contrast. - Dexamethasone 10mg  q 8 hours - indicated for focal vasogenic brain parenchymal edema [ ]  Neurology: signed off [ ]  Neurosurgery:   - discussed findings, risks, and pt agrees for surgical resection of occipital tumor.   - recommends to improve DM control prior to surgery  # Headaches: Headaches for 2 months. Apparent mass posterior segment left upper lobe of lung. CT of head with Mass with vasogenic edema that appears to contain a degree of hemorrhage in the left occipital lobe. A neoplastic lesion is suspected. There is also an area of questionable hemorrhage along the superior aspect of the third ventricle. Patient currently without chest symptoms and P02 stable on RA  - Pain: 1 mg morphine Q3h, Norco 10 Q6PRN   # Metastatic Cancer - recurrence of primary throat vs lung cancer? - plan to refer to [facility] for further evaluation and determination of biopsy and surgical needs of metastatic tumors in lung, liver, and spleen  # HTN: Patient has been  prescribed hypertensive medication s in the past and then removed from them d/t hypotension.  - Will continue to monitor. Currently BP is high. Ordered Hydralazine with parameters. BP had been normal in office a few weeks ago, concern these readings are d/t to pain and anxiety over hospitalization.  - Will monitor and order daily medication if warranted after pain control  # Diabetes: Has had a history of diabetes. Has been without medications d/t financial reasons.  - Carb modified diet  - A1c 8.7 - started Levemir 10u today - Resistant SSI, with bedtime coverage [ ]  f/u CBGs - 200s to 400s  H/o of arrhythmia: Uncertain of history of arrhythmia, amiodarone had been listed in his medications of the past and he reports having had an "irregular heart rate" when asked.  - EKG - NSR, with PVCs - on telemetry - Avoid anticoagulation for now (pending neurosurgery)  Constipation: Patient reports this has been an issue since he was in the shelter. May need image study to rule out metastasis.  - Senakot and Miralax ordered  FEN/GI: Carb modified diet  Prophylaxis: No Hep d/t potential brain surgery. SCD's ordered for now.   Disposition: Pending clinical course  Subjective: Patient is pleasant and reports complaint of persistent headache. He states that his headaches started about 2 months ago, and have been getting progressively worse. He describes a detailed history of cancer with previous treatments. Patient also discussed recent hospitalization 1 year ago with difficult to control blood sugars. He reports that he is willing to undergo surgery to remove the brain tumor, which he was told  was benign.  ROS - +Admits headaches. +visual disturbances - Denies chest pain, shortness of breath, lightheadedness, weakness, confusion, nausea / vomiting, diarrhea /constipation.   Objective: Temp:  [97.2 F (36.2 C)-98.3 F (36.8 C)] 97.8 F (36.6 C) (08/14 1400) Pulse Rate:  [64-81] 81 (08/14  1400) Resp:  [16-20] 16 (08/14 1400) BP: (101-143)/(48-99) 110/65 mmHg (08/14 1400) SpO2:  [96 %-100 %] 97 % (08/14 1400) Physical Exam: General: NAD. Concerned, pleasant male.  HEENT: PERRLA, EOMI, MMM Neck: Supple, non-tender, no LAD Cardiovascular: RRR, no murmurs Respiratory: CTAB. No wheeze, rhonchi heard Abdomen: Soft. NTND. BS+. No masses noted Extremities: No erythema or edema.  Skin: No rashes present.  Neuro: Alert. Oriented. Visual deficit right lateral/superior visual field. Otherwise CN 2-12 grossly intact. Negative pronator drift. Romberg with mild dizziness. Finger to nose and shin to shin test normal.    Laboratory:  Recent Labs Lab 04/21/13 0815 04/21/13 0829  WBC 4.2  --   HGB 16.6 16.0  HCT 44.1 47.0  PLT 139*  --     Recent Labs Lab 04/21/13 0829 04/21/13 1041 04/21/13 1815  NA 137 137 128*  K 4.0 3.5 5.0  CL 100 100 91*  CO2  --  27 27  BUN 13 11 12   CREATININE 1.00 0.89 0.96  CALCIUM  --  9.6 9.5  PROT  --  6.7  --   BILITOT  --  0.7  --   ALKPHOS  --  122*  --   ALT  --  28  --   AST  --  29  --   GLUCOSE 305* 247* 549*    Imaging/Diagnostic Tests:  04/21/13 - MRI Brain w w/o contrast IMPRESSION:  46 x 32 x 37 mm left occipital extra-axial mass with associated  edema but no midline shift. Findings consistent with a left  occipital meningioma.  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 See discussion above.  04/21/13 - MRI Orbits - negative  04/21/13 Chest CT w contrast 1. There is nodular consolidation with some central air  bronchogram in the left upper lobe measures at least 3.3 x 3 cm.  This is highly suspicious for primary or secondary malignancy.  Further evaluation with bronchoscopy and/or biopsy is recommended.  2. Postsurgical changes are noted in the right upper lobe.  Spiculated scarring in the right upper lobe measures 2 x 1.8 cm.  3. No adenopathy.  4. There are low density lesions  within liver the largest in right  hepatic lobe centrally measures 7.4 cm. Low density lesion with  anterior aspect of the spleen measures 4 cm. This are highly  suspicious for metastatic disease. Further evaluation is  recommended.  5. Small layering gallstones are noted within gallbladder. No  adrenal gland mass is noted. Degenerative changes mid and lower  thoracic spine.   Saralyn Pilar, DO 04/22/2013, 5:16 PM PGY-1, [facility] FPTS Intern pager: [phone], text pages welcome

## 2013-04-22 NOTE — Progress Notes (Signed)
Utilization review completed. Karlissa Aron, RN, BSN. 

## 2013-04-22 NOTE — Progress Notes (Signed)
Inpatient Diabetes Program Recommendations  AACE/ADA: New Consensus Statement on Inpatient Glycemic Control (2013)  Target Ranges:  Prepandial:   less than 140 mg/dL      Peak postprandial:   less than 180 mg/dL (1-2 hours)      Critically ill patients:  140 - 180 mg/dL   Results for LAQUON, EMEL (MRN [id number]) as of 04/22/2013 08:56  Ref. Range 04/21/2013 12:57 04/21/2013 16:48 04/21/2013 17:10 04/21/2013 19:00 04/21/2013 22:38 04/22/2013 06:53  Glucose-Capillary Latest Range: 70-99 mg/dL 782 (H) 956 (H) 213 (H) 402 (H) 200 (H) 267 (H)   Inpatient Diabetes Program Recommendations Insulin - Basal: Please consider ordering low dose basal insulin; recommend starting with Levemir 10 units daily. Correction (SSI): Please consider increasing Novolog correction to resistant scale and adding bedtime correction.  Note: Patient has a history of diabetes but was not taking any medications for diabetes management due to financial reasons (noted in H&P on 04/21/13 by Dr. Claiborne Billings).  Currently, patient is ordered to receive Novolog 0-9 units AC for inpatient glycemic control.  Patient is also ordered Decadron 10 mg IV TID which is contributing to hyperglycemia.  Please consider order low dose basal insulin; recommend starting with Levemir 10 units daily.  Also, please consider increasing Novolog correction to resistant scale and adding bedtime correction.  Will continue to follow.  Thanks, Orlando Penner, RN, MSN, CCRN Diabetes Coordinator Inpatient Diabetes Program [phone]

## 2013-04-22 NOTE — Progress Notes (Signed)
Patient ID: William Townsend, male   DOB: 06-08-1961, 52 y.o.   MRN: [id number] Stable, decrease of headache. Still visual findings. To go ahead with resection of tumor once he is ready medically

## 2013-04-23 LAB — BASIC METABOLIC PANEL
BUN: 21 mg/dL (ref 6–23)
CO2: 26 mEq/L (ref 19–32)
Calcium: 9.5 mg/dL (ref 8.4–10.5)
GFR calc non Af Amer: >90 mL/min (ref >90)
Glucose, Bld: 379 mg/dL — ABNORMAL HIGH (ref 70–99)
Potassium: 4.7 mEq/L (ref 3.5–5.1)

## 2013-04-23 LAB — GLUCOSE, CAPILLARY
Glucose-Capillary: 359 mg/dL — ABNORMAL HIGH (ref 70–99)
Glucose-Capillary: 238 mg/dL — ABNORMAL HIGH (ref 70–99)

## 2013-04-23 MED ORDER — INSULIN DETEMIR 100 UNIT/ML ~~LOC~~ SOLN
20.0000 [IU] | Freq: Every day | SUBCUTANEOUS | Status: DC
  Administered 2013-04-23: 20 [IU] via SUBCUTANEOUS
  Filled 2013-04-23: qty 0.2

## 2013-04-23 MED ORDER — INSULIN DETEMIR 100 UNIT/ML ~~LOC~~ SOLN
25.0000 [IU] | Freq: Every day | SUBCUTANEOUS | Status: DC
  Administered 2013-04-24: 25 [IU] via SUBCUTANEOUS
  Filled 2013-04-23: qty 0.25

## 2013-04-23 NOTE — Progress Notes (Signed)
Family Medicine Teaching Service Daily Progress Note Intern Pager: [phone]  Patient name: William Townsend Medical record number: [id number] Date of birth: 17-Jun-1961 Age: 52 y.o. Gender: male  Primary Care Provider: Clare Gandy, MD Consultants: Neurology, Neurosurgery Code Status: DNR  Pt Overview and Major Events to Date:  8/14 - Neurosurgery plans to surgically resect occipital tumor pending improved DM control 8/15 - Increased Levemir 20u BID / CBG improved 238 (360-380s)  Assessment and Plan:  William Townsend is a 52 y.o. year old male presenting with right sided visual changes the last two days and headaches for past 2 months. PMH is significant for throat and lung cancer, HTN and diabetes. CT of head with occipital mass and xray with lung mass.  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 Consistent with occipital meningioma MRI Orbits - Negative MRI of the orbits without and with contrast. (8/15) Patient reports persistent headache, with some improvement today. Visual symptoms persistent [ ]  Neurology: signed off [ ]  Neurosurgery:   - discussed findings, risks, and pt agrees for surgical resection of occipital tumor.   - recommends to improve DM control prior to surgery (8/15) Continue Decadron 10mg  q 8 hours - indicated for focal vasogenic brain parenchymal edema Pre-Op Clearance: per ACC/AHA Guidelines Head surgery is considered Intermediate Risk, with incidence of 1-5% risk of cardiac death or non-fatal MI. Hx of DM is a risk factor for this patient.  # Headaches: Headaches for 2 months. Apparent mass posterior segment left upper lobe of lung. CT of head with Mass with vasogenic edema that appears to contain a degree of hemorrhage in the left occipital lobe. A neoplastic lesion is suspected. There is also an area of questionable hemorrhage along the superior aspect of the third  ventricle. Patient currently without chest symptoms and P02 stable on RA  - Pain: 2 mg morphine Q3h PRN, Norco 10 Q6PRN   # Metastatic Cancer - recurrence of primary throat vs lung cancer? - plan to refer to [facility] for further evaluation and determination of biopsy and surgical needs of metastatic tumors in lung, liver, and spleen. Will discuss this with patient and provide with information. Our plan is to have [facility]up with [facility] upon completion of brain surgery during this hospitalization.  # HTN: Patient has been prescribed hypertensive medication s in the past and then removed from them d/t hypotension.  - Will continue to monitor. Previously elevated BP, had been normal in office a few weeks ago, concern these readings are d/t to pain and anxiety over hospitalization. - Hydralazine 5mg  IV q 4 hr PRN for BP >180/100 (8/15) Stable BP 110-20s / 70-80s  # Diabetes: Has had a history of diabetes. Has been without medications d/t financial reasons.  - Carb modified diet  - A1c 8.7 [ ]  f/u CBGs - improvement today with last CBG 238 (previously >350s) - Resistant SSI, with bedtime coverage - increased Levemir 20u today - continue to follow sugars over weekend  H/o of arrhythmia: Uncertain of history of arrhythmia, amiodarone had been listed in his medications of the past and he reports having had an "irregular heart rate" when asked.  - EKG - NSR, with PVCs - on telemetry - Avoid anticoagulation for now (pending neurosurgery) (8/15) Plan to repeat EKG prior to planned surgical resection of brain tumor - to confirm patient is NSR vs AFib or other arrhythmia. Also, EKG is  only indicated pre-op evaluation in this patient.  Constipation: Patient reports this has been an issue since he was in the shelter. May need image study to rule out metastasis.  - Senakot and Miralax ordered  FEN/GI: Carb modified diet  Prophylaxis: No Hep d/t potential brain surgery. SCD's ordered for now.    Disposition: Pending clinical course  Subjective: Patient was up ambulating and returned to room. He says that his headache is still persisting, although may feel slightly better today. He says that he still has cravings for nicotine, but thinks that the higher dose patch is working better. He expresses concern for the overall plan for his upcoming brain surgery and is interested to know our intended course. No new visual field deficits.  ROS - +Admits headaches. +visual disturbances - Denies chest pain, shortness of breath, lightheadedness, weakness, confusion, nausea / vomiting, diarrhea /constipation.   Objective: Temp:  [97.6 F (36.4 C)-97.9 F (36.6 C)] 97.9 F (36.6 C) (08/15 1133) Pulse Rate:  [66-85] 69 (08/15 1133) Resp:  [16-18] 16 (08/15 1133) BP: (117-122)/(70-82) 119/71 mmHg (08/15 1133) SpO2:  [97 %-100 %] 100 % (08/15 1133) Physical Exam: General: pleasant, NAD HEENT: PERRLA, EOMI, MMM Neck: Supple, non-tender, no LAD Cardiovascular: RRR, no murmurs Respiratory: CTAB. Abdomen: Soft. NTND. BS+. Extremities: No erythema or edema. +2 peripheral pusles Skin: No rashes present.  Neuro: Alert. Oriented. Visual deficit right lateral/superior visual field (unchanged today). Otherwise CN 2-12 grossly intact.   Laboratory:  Recent Labs Lab 04/21/13 0815 04/21/13 0829  WBC 4.2  --   HGB 16.6 16.0  HCT 44.1 47.0  PLT 139*  --     Recent Labs Lab 04/21/13 1041 04/21/13 1815 04/23/13 0625  NA 137 128* 137  K 3.5 5.0 4.7  CL 100 91* 100  CO2 27 27 26   BUN 11 12 21   CREATININE 0.89 0.96 0.93  CALCIUM 9.6 9.5 9.5  PROT 6.7  --   --   BILITOT 0.7  --   --   ALKPHOS 122*  --   --   ALT 28  --   --   AST 29  --   --   GLUCOSE 247* 549* 379*    Imaging/Diagnostic Tests:  04/21/13 - MRI Brain w w/o contrast IMPRESSION:  46 x 32 x 37 mm left occipital extra-axial mass with associated  edema but no midline shift. Findings consistent with a left  occipital  meningioma.  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 See discussion above.  04/21/13 - MRI Orbits - negative  04/21/13 Chest CT w contrast 1. There is nodular consolidation with some central air  bronchogram in the left upper lobe measures at least 3.3 x 3 cm.  This is highly suspicious for primary or secondary malignancy.  Further evaluation with bronchoscopy and/or biopsy is recommended.  2. Postsurgical changes are noted in the right upper lobe.  Spiculated scarring in the right upper lobe measures 2 x 1.8 cm.  3. No adenopathy.  4. There are low density lesions within liver the largest in right  hepatic lobe centrally measures 7.4 cm. Low density lesion with  anterior aspect of the spleen measures 4 cm. This are highly  suspicious for metastatic disease. Further evaluation is  recommended.  5. Small layering gallstones are noted within gallbladder. No  adrenal gland mass is noted. Degenerative changes mid and lower  thoracic spine.   Saralyn Pilar, DO 04/23/2013, 3:51 PM PGY-1, [facility] FPTS  Intern pager: [phone], text pages welcome

## 2013-04-23 NOTE — Progress Notes (Signed)
Continue on decadron, less headache but still with same visual changes. The plan is to take him to surgery early next week once we get the ok by his doctor

## 2013-04-23 NOTE — Progress Notes (Signed)
I discussed with  Dr Karamalegos.  I agree with their plans documented in their progress note for today.  

## 2013-04-23 NOTE — Progress Notes (Signed)
I discussed with  Dr Althea Charon.  I agree with their plans documented in their progress note for today. Working on getting patient's average CBG below 200.  Pt is medically stable for neurosurgical intervention on probable symptomatic meningioma.

## 2013-04-24 DIAGNOSIS — R51 Headache: Secondary | ICD-10-CM

## 2013-04-24 DIAGNOSIS — G8929 Other chronic pain: Secondary | ICD-10-CM

## 2013-04-24 DIAGNOSIS — M2550 Pain in unspecified joint: Secondary | ICD-10-CM

## 2013-04-24 LAB — BASIC METABOLIC PANEL
BUN: 25 mg/dL — ABNORMAL HIGH (ref 6–23)
CO2: 25 mEq/L (ref 19–32)
Chloride: 97 mEq/L (ref 96–112)
GFR calc non Af Amer: >90 mL/min (ref >90)
Glucose, Bld: 341 mg/dL — ABNORMAL HIGH (ref 70–99)
Potassium: 4.1 mEq/L (ref 3.5–5.1)
Sodium: 134 mEq/L — ABNORMAL LOW (ref 135–145)

## 2013-04-24 LAB — GLUCOSE, CAPILLARY
Glucose-Capillary: 317 mg/dL — ABNORMAL HIGH (ref 70–99)
Glucose-Capillary: 405 mg/dL — ABNORMAL HIGH (ref 70–99)

## 2013-04-24 MED ORDER — ZOLPIDEM TARTRATE 5 MG PO TABS
5.0000 mg | ORAL_TABLET | Freq: Every evening | ORAL | Status: DC | PRN
  Administered 2013-04-24 – 2013-04-30 (×7): 5 mg via ORAL
  Filled 2013-04-24 (×7): qty 1

## 2013-04-24 MED ORDER — LORAZEPAM 2 MG/ML IJ SOLN: 1.0000 mg | Freq: Four times a day (QID) | INTRAMUSCULAR | Status: DC | PRN

## 2013-04-24 MED ORDER — LORAZEPAM 1 MG PO TABS: 1.0000 mg | ORAL_TABLET | Freq: Four times a day (QID) | ORAL | Status: DC | PRN

## 2013-04-24 MED ORDER — INSULIN ASPART 100 UNIT/ML ~~LOC~~ SOLN
5.0000 [IU] | Freq: Once | SUBCUTANEOUS | Status: AC
  Administered 2013-04-24: 5 [IU] via SUBCUTANEOUS

## 2013-04-24 MED ORDER — INSULIN DETEMIR 100 UNIT/ML ~~LOC~~ SOLN: 30.0000 [IU] | Freq: Every day | SUBCUTANEOUS | Status: DC

## 2013-04-24 MED ORDER — DEXAMETHASONE SODIUM PHOSPHATE 10 MG/ML IJ SOLN
5.0000 mg | Freq: Four times a day (QID) | INTRAMUSCULAR | Status: DC
  Administered 2013-04-24 – 2013-04-27 (×12): 5 mg via INTRAVENOUS
  Filled 2013-04-24 (×16): qty 0.5

## 2013-04-24 MED ORDER — INSULIN DETEMIR 100 UNIT/ML ~~LOC~~ SOLN
5.0000 [IU] | Freq: Once | SUBCUTANEOUS | Status: AC
  Administered 2013-04-24: 5 [IU] via SUBCUTANEOUS
  Filled 2013-04-24: qty 0.05

## 2013-04-24 NOTE — Progress Notes (Signed)
Family Medicine Teaching Service Daily Progress Note Intern Pager: [phone]  Patient name: William Townsend Medical record number: [id number] Date of birth: March 14, 1961 Age: 52 y.o. Gender: male  Primary Care Provider: Clare Gandy, MD Consultants: neurosurgery, neurology Code Status: DNR  Pt Overview and Major Events to Date:  8/14 - Neurosurgery plans to surgically resect occipital tumor pending improved DM control  8/15 - Increased Levemir 20u BID / CBG improved 238 (360-380s)  Assessment and Plan:  William Townsend is a 52 y.o. year old male presenting with right sided visual changes the last two days and headaches for past 2 months. PMH is significant for throat and lung cancer, HTN and diabetes. CT of head with occipital mass and xray with lung mass.   # Occipital Meningioma, Left  (04/21/13) MRI w w/o contrast - showed left occipital extra-axial mass with associated edema but no midline shift. Consistent with occipital meningioma  MRI Orbits - Negative MRI of the orbits without and with contrast.   Visual symptoms persistent  [ ]  Neurology: signed off  [ ]  Neurosurgery:  - discussed findings, risks, and pt agrees for surgical resection of occipital tumor.  Pre-Op Clearance: per ACC/AHA Guidelines Head surgery is considered Intermediate Risk, with incidence of 1-5% risk of cardiac death or non-fatal MI. Hx of DM is a risk factor for this patient.  - Continue Decadron 10mg  q 8 hours - indicated for focal vasogenic brain parenchymal edema  - control diabetes prior to surgery - per neurosurgery: surgery scheduled for 04/27/13  # Headaches: Headaches for 2 months. Apparent mass posterior segment left upper lobe of lung. CT of head with Mass with vasogenic edema that appears to contain a degree of hemorrhage in the left occipital lobe. A neoplastic lesion is suspected. There is also an area of questionable hemorrhage along the superior aspect of the third ventricle.   - Pain: taking  regular doses of 2 mg morphine Q3h PRN. Norco 10 Q6PRN also available  # Metastatic Cancer  - recurrence of primary throat vs lung cancer?  - plan to refer to [facility] for further evaluation and determination of biopsy and surgical needs of metastatic tumors in lung, liver, and spleen. Will discuss this with patient and provide with information. Our plan is to have [facility] with [facility] upon completion of brain surgery during this hospitalization.   # Diabetes: Has had a history of diabetes. Has been without medications d/t financial reasons. A1C: 8.7. Increased CBG's with dexamethasone.  - Carb modified diet   - CBG's: 238-317 on 8/16, requiring 58 units of novolog - increase levemir from 20 units to 30 units. - continue Resistant SSI, with bedtime coverage   # HTN: Patient has been prescribed hypertensive medications in the past and then removed from them d/t hypotension.  - Will continue to monitor. Previously elevated BP, had been normal in office a few weeks ago, concern these readings are d/t to pain and anxiety over hospitalization.  - Hydralazine 5mg  IV q 4 hr PRN for BP >180/100  - BP stable 119/71- 133/72   # H/o of arrhythmia: Uncertain of history of arrhythmia, amiodarone had been listed in his medications of the past and he reports having had an "irregular heart rate" when asked.  - EKG - NSR, with PVCs  - on telemetry  - Avoid anticoagulation for now (pending neurosurgery)  (8/15) Plan to repeat EKG prior to planned surgical resection of brain tumor - to confirm patient is NSR vs  AFib or other arrhythmia. Also, EKG is only indicated pre-op evaluation in this patient.   # Constipation: Patient reports this has been an issue since he was in the shelter. May need image study to rule out metastasis.  - continue Senakot and Miralax  FEN/GI: Carb modified diet  Prophylaxis: No Hep d/t potential brain surgery. SCD's ordered for now.   Disposition: Pending clinical  course   Subjective:  Headache controled with IV morphine, needing to take it every 4hrs.  Worsening blurred vision in right eye as well as double vision.   Objective: Temp:  [97.8 F (36.6 C)-98.3 F (36.8 C)] 97.9 F (36.6 C) (08/16 1020) Pulse Rate:  [56-80] 60 (08/16 1020) Resp:  [16-20] 18 (08/16 1020) BP: (119-133)/(71-86) 124/74 mmHg (08/16 1020) SpO2:  [98 %-100 %] 99 % (08/16 1020) Physical Exam: General: no acute distress, pleasant Cardiovascular: S1S2, RRR, no murmur  Respiratory: normal work of breathing, clear to auscultation bilaterally Abdomen: soft, non tender Extremities: no edema Neuro: decreased peripheral vision in right lateral superior field, blurred vision in left eye, right pupil slightly smaller than left, but reactive to light and accomodation. otherwise CN2-12 grossly intact.   Laboratory:  Recent Labs Lab 04/21/13 0815 04/21/13 0829  WBC 4.2  --   HGB 16.6 16.0  HCT 44.1 47.0  PLT 139*  --     Recent Labs Lab 04/21/13 1041 04/21/13 1815 04/23/13 0625  NA 137 128* 137  K 3.5 5.0 4.7  CL 100 91* 100  CO2 27 27 26   BUN 11 12 21   CREATININE 0.89 0.96 0.93  CALCIUM 9.6 9.5 9.5  PROT 6.7  --   --   BILITOT 0.7  --   --   ALKPHOS 122*  --   --   ALT 28  --   --   AST 29  --   --   GLUCOSE 247* 549* 379*     Imaging/Diagnostic Tests: 04/21/13 - MRI Brain w w/o contrast  IMPRESSION:  46 x 32 x 37 mm left occipital extra-axial mass with associated  edema but no midline shift. Findings consistent with a left  occipital meningioma.  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 See discussion above.  04/21/13 - MRI Orbits - negative  04/21/13 Chest CT w contrast  1. There is nodular consolidation with some central air  bronchogram in the left upper lobe measures at least 3.3 x 3 cm.  This is highly suspicious for primary or secondary malignancy.  Further evaluation with bronchoscopy and/or biopsy is  recommended.  2. Postsurgical changes are noted in the right upper lobe.  Spiculated scarring in the right upper lobe measures 2 x 1.8 cm.  3. No adenopathy.  4. There are low density lesions within liver the largest in right  hepatic lobe centrally measures 7.4 cm. Low density lesion with  anterior aspect of the spleen measures 4 cm. This are highly  suspicious for metastatic disease. Further evaluation is  recommended.  5. Small layering gallstones are noted within gallbladder. No  adrenal gland mass is noted. Degenerative changes mid and lower  thoracic spine.   Lonia Skinner, MD 04/24/2013, 10:43 AM PGY-3, [facility] FPTS Intern pager: [phone], text pages welcome

## 2013-04-24 NOTE — Progress Notes (Signed)
Neuro stable, plan to go ahead with craniotomy for resection of meningioma  August 19

## 2013-04-24 NOTE — Progress Notes (Signed)
I discussed with  Dr Losq.  I agree with their plans documented in their progress note for today.  

## 2013-04-25 LAB — BASIC METABOLIC PANEL
BUN: 27 mg/dL — ABNORMAL HIGH (ref 6–23)
CO2: 25 mEq/L (ref 19–32)
Calcium: 9.3 mg/dL (ref 8.4–10.5)
GFR calc non Af Amer: >90 mL/min (ref >90)
Glucose, Bld: 316 mg/dL — ABNORMAL HIGH (ref 70–99)
Sodium: 133 mEq/L — ABNORMAL LOW (ref 135–145)

## 2013-04-25 LAB — GLUCOSE, CAPILLARY
Glucose-Capillary: 359 mg/dL — ABNORMAL HIGH (ref 70–99)
Glucose-Capillary: 353 mg/dL — ABNORMAL HIGH (ref 70–99)

## 2013-04-25 MED ORDER — LORAZEPAM 2 MG/ML IJ SOLN: 1.0000 mg | Freq: Four times a day (QID) | INTRAMUSCULAR | Status: DC | PRN

## 2013-04-25 MED ORDER — INSULIN ASPART 100 UNIT/ML ~~LOC~~ SOLN
0.0000 [IU] | Freq: Every day | SUBCUTANEOUS | Status: DC
  Administered 2013-04-25: 5 [IU] via SUBCUTANEOUS
  Administered 2013-04-26: 3 [IU] via SUBCUTANEOUS
  Administered 2013-04-27: 4 [IU] via SUBCUTANEOUS
  Administered 2013-04-28: 3 [IU] via SUBCUTANEOUS

## 2013-04-25 MED ORDER — INSULIN DETEMIR 100 UNIT/ML ~~LOC~~ SOLN
35.0000 [IU] | Freq: Every day | SUBCUTANEOUS | Status: DC
  Administered 2013-04-25: 35 [IU] via SUBCUTANEOUS
  Filled 2013-04-25: qty 0.35

## 2013-04-25 MED ORDER — LORAZEPAM 1 MG PO TABS: 1.0000 mg | ORAL_TABLET | Freq: Four times a day (QID) | ORAL | Status: DC | PRN

## 2013-04-25 NOTE — Progress Notes (Signed)
PCP note:  I appreciate the inpatient excellent care. Thank you.   Clare Gandy, MD  PGY-1

## 2013-04-25 NOTE — Progress Notes (Signed)
Patient ID: William Townsend, male   DOB: 28-Jun-1961, 52 y.o.   MRN: [id number] To or for left occipital craniotomy on 04/26/13. Talked with him and showed the mri with the 2 lesions. Agrees with the surgery. Risks and benefits were fully explained

## 2013-04-25 NOTE — Progress Notes (Signed)
Family Medicine Teaching Service Daily Progress Note Intern Pager: [phone]  Patient name: William Townsend Medical record number: [id number] Date of birth: [birth year] Age: 52 y.o. Gender: male  Primary Care Provider: Clare Gandy, MD Consultants: neurosurgery, neurology Code Status: DNR  Pt Overview and Major Events to Date:  8/14 - Neurosurgery plans to surgically resect occipital tumor pending improved DM control  8/15 - Increased Levemir 20 units daily (not BID as documented 8/16)  8/16 - increased Levemir to 30 units, decreased Decadron to 5 mg q6  Assessment and Plan:  William Townsend is a 52 y.o. year old male presenting with right sided visual changes the last two days and headaches for past 2 months. PMH is significant for throat and lung cancer, HTN and diabetes. CT of head with occipital mass and xray with lung mass.   # Occipital Meningioma, Left  (04/21/13) MRI w w/o contrast - showed left occipital extra-axial mass with associated edema but no midline shift. Consistent with occipital meningioma  MRI Orbits - Negative MRI of the orbits without and with contrast. Visual symptoms persistent  - Neurology: signed off  - Neurosurgery: planning left occipital craniotomy 8/19 Pre-Op Clearance: per ACC/AHA Guidelines Head surgery is considered Intermediate Risk, with incidence of 1-5% risk of cardiac death or non-fatal MI. Hx of DM is a risk factor for this patient.  - Continue Decadron 5 mg q 8 hours - indicated for focal vasogenic brain parenchymal edema  - attempting to control diabetes more consistently prior to surgery  # Headaches: Headaches for 2 months. Apparent mass posterior segment left upper lobe of lung. CT of head with Mass with vasogenic edema that appears to contain a degree of hemorrhage in the left occipital lobe. A neoplastic lesion is suspected. There is also an area of questionable hemorrhage along the superior aspect of the third ventricle.  - Pain: taking  regular doses of 2 mg morphine Q3h PRN. Norco 10 Q6PRN also available  # Metastatic Cancer  - recurrence of primary throat vs lung cancer?  - plan to refer to [facility] for further evaluation and determination of biopsy and surgical needs of metastatic tumors in lung, liver, and spleen. Will discuss this with patient and provide with information. Our plan is to have [facility] with [facility] upon completion of brain surgery during this hospitalization.   # Diabetes: Has had a history of diabetes. Has been without medications d/t financial reasons. A1C: 8.7. Increased CBG's with dexamethasone.  - Carb modified diet   - CBG's 8/16 with some highs in 300's-400's - increasing Levemir from 30 units to 35 units. - continue Resistant SSI, with bedtime coverage added 8/17  # HTN: Patient has been prescribed hypertensive medications in the past and then removed from them d/t hypotension.  - Will continue to monitor. Previously elevated BP, had been normal in office a few weeks ago, concern these readings are d/t to pain and anxiety over hospitalization.  - Hydralazine 5mg  IV q 4 hr PRN for BP >180/100  - BP stable 120's-few 140's systolic  # H/o of arrhythmia: Uncertain of history of arrhythmia, amiodarone had been listed in his medications of the past and he reports having had an "irregular heart rate" when asked.  - EKG - NSR, with PVCs  - on telemetry  - Avoid anticoagulation for now (pending neurosurgery)  (8/15) Plan to repeat EKG prior to planned surgical resection of brain tumor - to confirm patient is NSR vs AFib or other  arrhythmia. Also, EKG is only indicated pre-op evaluation in this patient.   # Constipation: Patient reports this has been an issue since he was in the shelter. May need image study to rule out metastasis.  - continue Senakot and Miralax   FEN/GI: Carb modified diet  Prophylaxis: No Hep d/t potential brain surgery. SCD's ordered for now.   Disposition: Management as  above. Discharge planning pending surgical intervention and any further work-up  Subjective: Headaches and blurred/changed vision remain, similar to previous. Otherwise pain well controlled with PRN medications. No other complaints this AM.  Objective: Temp:  [97.3 F (36.3 C)-98.1 F (36.7 C)] 97.9 F (36.6 C) (08/17 0600) Pulse Rate:  [56-66] 59 (08/17 0600) Resp:  [16-18] 16 (08/17 0600) BP: (122-147)/(64-85) 129/76 mmHg (08/17 0600) SpO2:  [98 %-100 %] 100 % (08/17 0600) Physical Exam: General: adult male in no acute distress, very pleasant, alert/oriented Cardiovascular: S1S2, RRR, no murmur  Respiratory: normal work of breathing, clear to auscultation bilaterally Abdomen: soft, non tender Extremities: no edema Neuro: decreased peripheral vision in right lateral superior field, blurred vision in left eye  right pupil remains slightly smaller than left, but equally reactive to light and accomodation.  Laboratory:  Recent Labs Lab 04/21/13 0815 04/21/13 0829  WBC 4.2  --   HGB 16.6 16.0  HCT 44.1 47.0  PLT 139*  --     Recent Labs Lab 04/21/13 1041  04/23/13 0625 04/24/13 1249 04/25/13 0528  NA 137  < > 137 134* 133*  K 3.5  < > 4.7 4.1 3.9  CL 100  < > 100 97 94*  CO2 27  < > 26 25 25   BUN 11  < > 21 25* 27*  CREATININE 0.89  < > 0.93 0.95 0.88  CALCIUM 9.6  < > 9.5 9.3 9.3  PROT 6.7  --   --   --   --   BILITOT 0.7  --   --   --   --   ALKPHOS 122*  --   --   --   --   ALT 28  --   --   --   --   AST 29  --   --   --   --   GLUCOSE 247*  < > 379* 341* 316*  < > = values in this interval not displayed.   Imaging/Diagnostic Tests: 04/21/13 - MRI Brain w w/o contrast  IMPRESSION:  46 x 32 x 37 mm left occipital extra-axial mass with associated  edema but no midline shift. Findings consistent with a left  occipital meningioma.  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 See discussion above.  04/21/13 - MRI Orbits -  negative  04/21/13 Chest CT w contrast  1. There is nodular consolidation with some central air  bronchogram in the left upper lobe measures at least 3.3 x 3 cm.  This is highly suspicious for primary or secondary malignancy.  Further evaluation with bronchoscopy and/or biopsy is recommended.  2. Postsurgical changes are noted in the right upper lobe.  Spiculated scarring in the right upper lobe measures 2 x 1.8 cm.  3. No adenopathy.  4. There are low density lesions within liver the largest in right  hepatic lobe centrally measures 7.4 cm. Low density lesion with  anterior aspect of the spleen measures 4 cm. This are highly  suspicious for metastatic disease. Further evaluation is  recommended.  5. Small layering gallstones are noted  within gallbladder. No  adrenal gland mass is noted. Degenerative changes mid and lower  thoracic spine.   Stephanie Coup Meggin Ola, MD 04/25/2013, 9:04 AM PGY-3, [facility] FPTS Intern pager: [phone], text pages welcome

## 2013-04-26 ENCOUNTER — Other Ambulatory Visit: Payer: Self-pay | Admitting: Neurosurgery

## 2013-04-26 LAB — GLUCOSE, CAPILLARY
Glucose-Capillary: 232 mg/dL — ABNORMAL HIGH (ref 70–99)
Glucose-Capillary: 335 mg/dL — ABNORMAL HIGH (ref 70–99)
Glucose-Capillary: 328 mg/dL — ABNORMAL HIGH (ref 70–99)
Glucose-Capillary: 306 mg/dL — ABNORMAL HIGH (ref 70–99)

## 2013-04-26 LAB — BASIC METABOLIC PANEL
GFR calc Af Amer: >90 mL/min (ref >90)
GFR calc non Af Amer: >90 mL/min (ref >90)
Potassium: 4.2 mEq/L (ref 3.5–5.1)
Sodium: 134 mEq/L — ABNORMAL LOW (ref 135–145)

## 2013-04-26 LAB — CBC
Hemoglobin: 18.2 g/dL — ABNORMAL HIGH (ref 13.0–17.0)
RBC: 5.43 MIL/uL (ref 4.22–5.81)
WBC: 9.1 10*3/uL (ref 4.0–10.5)

## 2013-04-26 MED ORDER — INSULIN DETEMIR 100 UNIT/ML ~~LOC~~ SOLN: 30.0000 [IU] | Freq: Two times a day (BID) | SUBCUTANEOUS | Status: DC

## 2013-04-26 MED ORDER — INSULIN DETEMIR 100 UNIT/ML ~~LOC~~ SOLN
30.0000 [IU] | Freq: Two times a day (BID) | SUBCUTANEOUS | Status: DC
  Administered 2013-04-26 – 2013-04-29 (×6): 30 [IU] via SUBCUTANEOUS
  Filled 2013-04-26 (×7): qty 0.3

## 2013-04-26 MED ORDER — INSULIN DETEMIR 100 UNIT/ML ~~LOC~~ SOLN: 40.0000 [IU] | Freq: Every day | SUBCUTANEOUS | Status: DC

## 2013-04-26 MED ORDER — INSULIN DETEMIR 100 UNIT/ML ~~LOC~~ SOLN
10.0000 [IU] | Freq: Once | SUBCUTANEOUS | Status: AC
  Administered 2013-04-26: 10 [IU] via SUBCUTANEOUS
  Filled 2013-04-26: qty 0.1

## 2013-04-26 MED ORDER — INSULIN DETEMIR 100 UNIT/ML ~~LOC~~ SOLN
25.0000 [IU] | Freq: Two times a day (BID) | SUBCUTANEOUS | Status: DC
  Filled 2013-04-26 (×2): qty 0.25

## 2013-04-26 MED ORDER — INSULIN DETEMIR 100 UNIT/ML ~~LOC~~ SOLN: 20.0000 [IU] | Freq: Two times a day (BID) | SUBCUTANEOUS | Status: DC

## 2013-04-26 MED ORDER — CEFAZOLIN SODIUM-DEXTROSE 2-3 GM-% IV SOLR
2.0000 g | INTRAVENOUS | Status: AC
  Administered 2013-04-27: 2 g via INTRAVENOUS
  Filled 2013-04-26: qty 50

## 2013-04-26 NOTE — Progress Notes (Signed)
Family Medicine Teaching Service Daily Progress Note Intern Pager: [phone]  Patient name: William Townsend Medical record number: [id number] Date of birth: [birth year] Age: 52 y.o. Gender: male  Primary Care Provider: Clare Gandy, MD Consultants: neurosurgery, neurology Code Status: DNR  Pt Overview and Major Events to Date:  8/14 - Neurosurgery plans to surgically resect occipital tumor pending improved DM control  8/15 - Increased Levemir 20 units daily (not BID as documented 8/16)  8/16 - increased Levemir to 30 units, decreased Decadron to 5 mg q6 8/17 - increased Levemir to 35u 8/18 - Increased Levemir to 25u BID - (50 total) / per report Neurosurgery plans for surg 8/19 8/18 - MEDICALLY CLEARED FOR SURGERY / EKG NSR / CBGs improved  Assessment and Plan:  William Townsend is a 52 y.o. year old male presenting with right sided visual changes the last two days and headaches for past 2 months. PMH is significant for throat and lung cancer, HTN and diabetes. CT of head with occipital mass and xray with lung mass.   # Occipital Meningioma, Left  (04/21/13) MRI w w/o contrast - showed left occipital extra-axial mass with associated edema but no midline shift. Consistent with occipital meningioma  MRI Orbits - Negative MRI of the orbits without and with contrast. Visual symptoms persistent  - Neurology: signed off  - Neurosurgery: planning left occipital craniotomy 8/19 Pre-Op Clearance: per ACC/AHA Guidelines Head surgery is considered Intermediate Risk, with incidence of 1-5% risk of cardiac death or non-fatal MI. Hx of DM is a risk factor for this patient.  - Continue Decadron 5 mg q 8 hours - indicated for focal vasogenic brain parenchymal edema  - Continue attempts to control DM control prior to surgery, (neurosurgery recs CBGs <150s)  # Headaches:  Headaches for 2 months. Apparent mass posterior segment left upper lobe of lung. CT of head with Mass with vasogenic edema that appears  to contain a degree of hemorrhage in the left occipital lobe. A neoplastic lesion is suspected. There is also an area of questionable hemorrhage along the superior aspect of the third ventricle.  - Pain: Morphine 2mg  Q3 PRN (has been receiving regular doses). Norco 10 Q6PRN also available (8/18) still +HA, little worse over weekend. Needing morphine more frequently  # Metastatic Cancer  - recurrence of primary throat vs lung cancer?  - plan to refer to [facility] for further evaluation and determination of biopsy and surgical needs of metastatic tumors in lung, liver, and spleen. Will discuss this with patient and provide with information. Our plan is to have [facility] upon completion of brain surgery during this hospitalization. [ ]  contact [facility], discuss case and plans to follow-up s/p meningioma resection  # Diabetes: - Has had a history of diabetes, without medications d/t financial reasons (>76yr). A1C: 8.7. Increased CBG's with dexamethasone. Neurosurgery recommends to get CBGs < 150s prior to surgery - Carb modified diet - continue Resistant SSI, with bedtime coverage (8/17) 24hr CBGs range 230-360 (8/18) increasing Levemir from 35u daily to 25u BID  # HTN: Patient has been prescribed hypertensive medications in the past and then removed from them d/t hypotension.  - Will continue to monitor. Previously elevated BP, had been normal in office a few weeks ago, concern these readings are d/t to pain and anxiety over hospitalization. - currently not on any BP meds - Hydralazine 5mg  IV q 4 hr PRN for BP >180/100 - BP stable 120-130s / 70-80s (high systolic 160),  HR stable 60s, other VSS  # H/o of arrhythmia: Uncertain of history of arrhythmia, amiodarone had been listed in his medications of the past and he reports having had an "irregular heart rate" when asked, believes to be Paroxysmal AFib - EKG (in ED) - NSR, with PVCs - on telemetry  - Avoid anticoagulation for now  (pending neurosurgery) (8/18) Repeat EKG - NSR, HR 61, no abnormalities, (no AFib or arrhythmia) - medically cleared for surgery  # Insomnia - Zolpidem 5mg  PRN  # Constipation: Patient reports this has been an issue since he was in the shelter. May need image study to rule out metastasis.  - continue Senakot and Miralax  FEN/GI: NS 100cc/hr / Carb modified diet  Prophylaxis: No Hep d/t potential brain surgery. SCD's ordered for now.   Disposition: Management as above. Discharge planning pending surgical intervention and any further work-up  Subjective: Patient reports persistent HA (possible slight worsening over weekend), as he has required morphine more frequently, but it is still helping his pain. Blurred vision persistent w/o change. No other concerns at this time. Neurosurgery has discussed surgery with him over weekend. Patient has been walking a lot and exercising in place to help control his blood sugars (especially after eating).  Objective: Temp:  [97.6 F (36.4 C)-98.5 F (36.9 C)] 98 F (36.7 C) (08/18 0538) Pulse Rate:  [56-80] 57 (08/18 0538) Resp:  [18-20] 20 (08/18 0538) BP: (133-160)/(71-88) 140/85 mmHg (08/18 0538) SpO2:  [95 %-100 %] 100 % (08/18 0538) Physical Exam: General: adult male in no acute distress, very pleasant, alert/oriented HEENT: PERRLA, EOMI, some conjunctival injection (increased bilateral) Cardiovascular: S1S2, RRR, no murmur  Respiratory: CTAB, normal work of breathing Abdomen: soft, non tender Extremities: no edema, non-tender, moves all ext Neuro: grossly non-focal, decreased peripheral vision in right lateral superior field, blurred vision in left eye  Laboratory:  Recent Labs Lab 04/21/13 0815 04/21/13 0829 04/26/13 0615  WBC 4.2  --  9.1  HGB 16.6 16.0 18.2*  HCT 44.1 47.0 48.2  PLT 139*  --  174    Recent Labs Lab 04/21/13 1041  04/24/13 1249 04/25/13 0528 04/26/13 0615  NA 137  < > 134* 133* 134*  K 3.5  < > 4.1 3.9  4.2  CL 100  < > 97 94* 97  CO2 27  < > 25 25 28   BUN 11  < > 25* 27* 27*  CREATININE 0.89  < > 0.95 0.88 0.89  CALCIUM 9.6  < > 9.3 9.3 9.4  PROT 6.7  --   --   --   --   BILITOT 0.7  --   --   --   --   ALKPHOS 122*  --   --   --   --   ALT 28  --   --   --   --   AST 29  --   --   --   --   GLUCOSE 247*  < > 341* 316* 254*  < > = values in this interval not displayed.   Imaging/Diagnostic Tests:  04/21/13 - MRI Brain w w/o contrast  IMPRESSION:  46 x 32 x 37 mm left occipital extra-axial mass with associated  edema but no midline shift. Findings consistent with a left  occipital meningioma.  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 See discussion above.  04/21/13 - MRI Orbits - negative  04/21/13 Chest CT w contrast  1. There is nodular  consolidation with some central air  bronchogram in the left upper lobe measures at least 3.3 x 3 cm.  This is highly suspicious for primary or secondary malignancy.  Further evaluation with bronchoscopy and/or biopsy is recommended.  2. Postsurgical changes are noted in the right upper lobe.  Spiculated scarring in the right upper lobe measures 2 x 1.8 cm.  3. No adenopathy.  4. There are low density lesions within liver the largest in right  hepatic lobe centrally measures 7.4 cm. Low density lesion with  anterior aspect of the spleen measures 4 cm. This are highly  suspicious for metastatic disease. Further evaluation is  recommended.  5. Small layering gallstones are noted within gallbladder. No  adrenal gland mass is noted. Degenerative changes mid and lower  thoracic spine.  8/18 EKG NSR, HR 61, no acute ST-T-wave changes, no abnormalities, unchanged from prior  Saralyn Pilar, DO 04/26/2013, 10:52 AM PGY-1, [facility] Family Medicine FPTS Intern pager: [phone], text pages William

## 2013-04-26 NOTE — Progress Notes (Signed)
FMTS Attending Admission Note: [name],MD I  have seen and examined this patient, reviewed their chart. I have discussed this patient with the resident. I agree with the resident's findings, assessment and care plan.  

## 2013-04-26 NOTE — Progress Notes (Signed)
Patient ID: William Townsend, male   DOB: 06-07-1961, 52 y.o.   MRN: [id number] To or in am for resection of occipital tumor

## 2013-04-26 NOTE — Progress Notes (Signed)
Patient ID: William Townsend, male   DOB: 19-Oct-1960, 52 y.o.   MRN: [id number] To surgery in am . No family members around to talk to. Patient aware of risks such as blindness, paralysis, hematoma , csf leak, infection, stroke and the possibility of being unable to tremove the tumor

## 2013-04-26 NOTE — Progress Notes (Signed)
Noted patient for surgery tomorrow. Recommend using IV insulin to control glucose rather than subcutaneous insulin.  This is the safest and most efficient method. Could start this evening or early am. Thank you, Lenor Coffin, RN, CNS, Diabetes Coordinator [phone])

## 2013-04-27 ENCOUNTER — Encounter (HOSPITAL_COMMUNITY): Payer: Self-pay | Admitting: *Deleted

## 2013-04-27 ENCOUNTER — Inpatient Hospital Stay (HOSPITAL_COMMUNITY): Payer: Self-pay

## 2013-04-27 ENCOUNTER — Inpatient Hospital Stay (HOSPITAL_COMMUNITY): Payer: Self-pay | Admitting: Anesthesiology

## 2013-04-27 ENCOUNTER — Encounter (HOSPITAL_COMMUNITY)
Admission: EM | Disposition: A | Discharge status: Home / Self Care | Payer: Self-pay | Source: Home / Self Care | Attending: Family Medicine

## 2013-04-27 ENCOUNTER — Encounter (HOSPITAL_COMMUNITY): Payer: Self-pay | Admitting: Anesthesiology

## 2013-04-27 HISTORY — PX: CRANIOTOMY: SHX93

## 2013-04-27 LAB — CBC
Hemoglobin: 14.6 g/dL (ref 13.0–17.0)
MCH: 32.7 pg (ref 26.0–34.0)
MCHC: 37.1 g/dL — ABNORMAL HIGH (ref 30.0–36.0)
Platelets: 140 10*3/uL — ABNORMAL LOW (ref 150–400)

## 2013-04-27 LAB — BASIC METABOLIC PANEL
BUN: 25 mg/dL — ABNORMAL HIGH (ref 6–23)
Calcium: 8 mg/dL — ABNORMAL LOW (ref 8.4–10.5)
Chloride: 100 mEq/L (ref 96–112)
Creatinine, Ser: 0.85 mg/dL (ref 0.50–1.35)
GFR calc Af Amer: >90 mL/min (ref >90)
GFR calc non Af Amer: >90 mL/min (ref >90)
Glucose, Bld: 151 mg/dL — ABNORMAL HIGH (ref 70–99)
Potassium: 4.2 mEq/L (ref 3.5–5.1)

## 2013-04-27 LAB — GLUCOSE, CAPILLARY
Glucose-Capillary: 197 mg/dL — ABNORMAL HIGH (ref 70–99)
Glucose-Capillary: 131 mg/dL — ABNORMAL HIGH (ref 70–99)
Glucose-Capillary: 355 mg/dL — ABNORMAL HIGH (ref 70–99)

## 2013-04-27 LAB — ABO/RH: ABO/RH(D): A POS

## 2013-04-27 LAB — PREPARE RBC (CROSSMATCH)

## 2013-04-27 SURGERY — CRANIOTOMY TUMOR EXCISION
Anesthesia: General | Site: Head | Laterality: Left | Wound class: Clean

## 2013-04-27 MED ORDER — PROMETHAZINE HCL 25 MG PO TABS: 12.5000 mg | ORAL_TABLET | ORAL | Status: DC | PRN

## 2013-04-27 MED ORDER — PROMETHAZINE HCL 25 MG/ML IJ SOLN
6.2500 mg | INTRAMUSCULAR | Status: DC | PRN
  Filled 2013-04-27: qty 1

## 2013-04-27 MED ORDER — GLYCOPYRROLATE 0.2 MG/ML IJ SOLN
INTRAMUSCULAR | Status: DC | PRN
  Administered 2013-04-27: 0.6 mg via INTRAVENOUS

## 2013-04-27 MED ORDER — SODIUM CHLORIDE 0.9 % IR SOLN
Status: DC | PRN
  Administered 2013-04-27 (×2): 1000 mL

## 2013-04-27 MED ORDER — SODIUM CHLORIDE 0.9 % IV SOLN
INTRAVENOUS | Status: DC | PRN
  Administered 2013-04-27: 07:00:00 via INTRAVENOUS

## 2013-04-27 MED ORDER — THROMBIN 20000 UNITS EX SOLR
CUTANEOUS | Status: DC | PRN
  Administered 2013-04-27: 09:00:00 via TOPICAL

## 2013-04-27 MED ORDER — VECURONIUM BROMIDE 10 MG IV SOLR
INTRAVENOUS | Status: DC | PRN
  Administered 2013-04-27 (×2): 1 mg via INTRAVENOUS
  Administered 2013-04-27: 2 mg via INTRAVENOUS
  Administered 2013-04-27: 1 mg via INTRAVENOUS

## 2013-04-27 MED ORDER — MIDAZOLAM HCL 2 MG/2ML IJ SOLN: 1.0000 mg | INTRAMUSCULAR | Status: DC | PRN

## 2013-04-27 MED ORDER — MORPHINE SULFATE 4 MG/ML IJ SOLN
4.0000 mg | INTRAMUSCULAR | Status: DC | PRN
  Administered 2013-04-27 – 2013-05-01 (×10): 4 mg via INTRAVENOUS
  Filled 2013-04-27 (×12): qty 1

## 2013-04-27 MED ORDER — SODIUM CHLORIDE 0.9 % IV SOLN
INTRAVENOUS | Status: DC
  Administered 2013-04-27 – 2013-04-28 (×2): via INTRAVENOUS

## 2013-04-27 MED ORDER — SODIUM CHLORIDE 0.9 % IV SOLN
500.0000 mg | Freq: Two times a day (BID) | INTRAVENOUS | Status: DC
  Administered 2013-04-27 – 2013-04-28 (×2): 500 mg via INTRAVENOUS
  Filled 2013-04-27 (×3): qty 5

## 2013-04-27 MED ORDER — HYDROCODONE-ACETAMINOPHEN 5-325 MG PO TABS
1.0000 | ORAL_TABLET | ORAL | Status: DC | PRN
  Administered 2013-04-27 – 2013-04-30 (×16): 1 via ORAL
  Filled 2013-04-27 (×17): qty 1

## 2013-04-27 MED ORDER — DEXAMETHASONE SODIUM PHOSPHATE 10 MG/ML IJ SOLN
6.0000 mg | Freq: Four times a day (QID) | INTRAMUSCULAR | Status: AC
  Administered 2013-04-27: 13:00:00 via INTRAVENOUS
  Administered 2013-04-27 – 2013-04-28 (×3): 6 mg via INTRAVENOUS
  Filled 2013-04-27 (×4): qty 0.6

## 2013-04-27 MED ORDER — ONDANSETRON HCL 4 MG/2ML IJ SOLN: 4.0000 mg | INTRAMUSCULAR | Status: DC | PRN

## 2013-04-27 MED ORDER — OXYCODONE HCL 5 MG PO TABS
5.0000 mg | ORAL_TABLET | Freq: Once | ORAL | Status: AC | PRN
  Administered 2013-04-27: 5 mg via ORAL
  Filled 2013-04-27: qty 1

## 2013-04-27 MED ORDER — HEMOSTATIC AGENTS (NO CHARGE) OPTIME
TOPICAL | Status: DC | PRN
  Administered 2013-04-27: 1 via TOPICAL

## 2013-04-27 MED ORDER — BACITRACIN ZINC 500 UNIT/GM EX OINT
TOPICAL_OINTMENT | CUTANEOUS | Status: DC | PRN
  Administered 2013-04-27: 1 via TOPICAL

## 2013-04-27 MED ORDER — BUPIVACAINE-EPINEPHRINE 0.5% -1:200000 IJ SOLN
INTRAMUSCULAR | Status: DC | PRN
  Administered 2013-04-27: 10 mL

## 2013-04-27 MED ORDER — FENTANYL CITRATE 0.05 MG/ML IJ SOLN: 25.0000 ug | INTRAMUSCULAR | Status: DC | PRN

## 2013-04-27 MED ORDER — FENTANYL CITRATE 0.05 MG/ML IJ SOLN: 50.0000 ug | Freq: Once | INTRAMUSCULAR | Status: DC

## 2013-04-27 MED ORDER — ROCURONIUM BROMIDE 100 MG/10ML IV SOLN
INTRAVENOUS | Status: DC | PRN
  Administered 2013-04-27: 50 mg via INTRAVENOUS

## 2013-04-27 MED ORDER — CEFAZOLIN SODIUM 1-5 GM-% IV SOLN
1.0000 g | Freq: Three times a day (TID) | INTRAVENOUS | Status: AC
  Administered 2013-04-27 (×2): 1 g via INTRAVENOUS
  Filled 2013-04-27 (×2): qty 50

## 2013-04-27 MED ORDER — DEXAMETHASONE SODIUM PHOSPHATE 4 MG/ML IJ SOLN: 4.0000 mg | Freq: Three times a day (TID) | INTRAMUSCULAR | Status: DC

## 2013-04-27 MED ORDER — THROMBIN 5000 UNITS EX SOLR
OROMUCOSAL | Status: DC | PRN
  Administered 2013-04-27 (×2): via TOPICAL

## 2013-04-27 MED ORDER — OXYCODONE HCL 5 MG/5ML PO SOLN: 5.0000 mg | Freq: Once | ORAL | Status: AC | PRN

## 2013-04-27 MED ORDER — LIDOCAINE HCL (CARDIAC) 20 MG/ML IV SOLN
INTRAVENOUS | Status: DC | PRN
  Administered 2013-04-27: 30 mg via INTRAVENOUS
  Administered 2013-04-27: 100 mg via INTRAVENOUS

## 2013-04-27 MED ORDER — EPHEDRINE SULFATE 50 MG/ML IJ SOLN
INTRAMUSCULAR | Status: DC | PRN
  Administered 2013-04-27: 2.5 mg via INTRAVENOUS
  Administered 2013-04-27: 5 mg via INTRAVENOUS
  Administered 2013-04-27: 2.5 mg via INTRAVENOUS

## 2013-04-27 MED ORDER — PROPOFOL 10 MG/ML IV BOLUS
INTRAVENOUS | Status: DC | PRN
  Administered 2013-04-27: 200 mg via INTRAVENOUS
  Administered 2013-04-27: 10 mg via INTRAVENOUS
  Administered 2013-04-27: 100 mg via INTRAVENOUS

## 2013-04-27 MED ORDER — ONDANSETRON HCL 4 MG PO TABS: 4.0000 mg | ORAL_TABLET | ORAL | Status: DC | PRN

## 2013-04-27 MED ORDER — POVIDONE-IODINE 10 % EX OINT
TOPICAL_OINTMENT | CUTANEOUS | Status: DC | PRN
  Administered 2013-04-27: 1 via TOPICAL

## 2013-04-27 MED ORDER — MORPHINE SULFATE 2 MG/ML IJ SOLN
1.0000 mg | INTRAMUSCULAR | Status: DC | PRN
  Administered 2013-04-27 (×2): 2 mg via INTRAVENOUS
  Filled 2013-04-27: qty 1

## 2013-04-27 MED ORDER — NEOSTIGMINE METHYLSULFATE 1 MG/ML IJ SOLN
INTRAMUSCULAR | Status: DC | PRN
  Administered 2013-04-27: 4 mg via INTRAVENOUS

## 2013-04-27 MED ORDER — PANTOPRAZOLE SODIUM 40 MG IV SOLR
40.0000 mg | Freq: Every day | INTRAVENOUS | Status: DC
  Administered 2013-04-27: 40 mg via INTRAVENOUS
  Filled 2013-04-27 (×2): qty 40

## 2013-04-27 MED ORDER — DEXAMETHASONE SODIUM PHOSPHATE 4 MG/ML IJ SOLN
4.0000 mg | Freq: Four times a day (QID) | INTRAMUSCULAR | Status: DC
  Administered 2013-04-28 (×2): 4 mg via INTRAVENOUS
  Filled 2013-04-27 (×4): qty 1

## 2013-04-27 MED ORDER — LABETALOL HCL 5 MG/ML IV SOLN
INTRAVENOUS | Status: DC | PRN
  Administered 2013-04-27 (×4): 5 mg via INTRAVENOUS

## 2013-04-27 MED ORDER — FENTANYL CITRATE 0.05 MG/ML IJ SOLN
INTRAMUSCULAR | Status: DC | PRN
  Administered 2013-04-27: 25 ug via INTRAVENOUS
  Administered 2013-04-27: 100 ug via INTRAVENOUS
  Administered 2013-04-27: 25 ug via INTRAVENOUS
  Administered 2013-04-27: 50 ug via INTRAVENOUS
  Administered 2013-04-27: 25 ug via INTRAVENOUS
  Administered 2013-04-27 (×4): 50 ug via INTRAVENOUS

## 2013-04-27 MED ORDER — ONDANSETRON HCL 4 MG/2ML IJ SOLN
INTRAMUSCULAR | Status: DC | PRN
  Administered 2013-04-27: 4 mg via INTRAVENOUS

## 2013-04-27 MED ORDER — LIDOCAINE HCL 4 % MT SOLN
OROMUCOSAL | Status: DC | PRN
  Administered 2013-04-27: 4 mL via TOPICAL

## 2013-04-27 MED ORDER — LABETALOL HCL 5 MG/ML IV SOLN: 10.0000 mg | INTRAVENOUS | Status: DC | PRN

## 2013-04-27 MED ORDER — MIDAZOLAM HCL 5 MG/5ML IJ SOLN
INTRAMUSCULAR | Status: DC | PRN
  Administered 2013-04-27 (×2): 1 mg via INTRAVENOUS

## 2013-04-27 SURGICAL SUPPLY — 87 items
APL SKNCLS STERI-STRIP NONHPOA (GAUZE/BANDAGES/DRESSINGS) ×1
BANDAGE GAUZE ELAST BULKY 4 IN (GAUZE/BANDAGES/DRESSINGS) IMPLANT
BENZOIN TINCTURE PRP APPL 2/3 (GAUZE/BANDAGES/DRESSINGS) ×1 IMPLANT
BIT DRILL WIRE PASS 1.3MM (BIT) IMPLANT
BLADE ULTRA TIP 2M (BLADE) ×2 IMPLANT
BRUSH SCRUB EZ 1% IODOPHOR (MISCELLANEOUS) ×1 IMPLANT
BUR ACORN 6.0 PRECISION (BURR) ×2 IMPLANT
BUR ADDG 1.1 (BURR) IMPLANT
BUR MATCHSTICK NEURO 3.0 LAGG (BURR) ×1 IMPLANT
BUR ROUTER D-58 CRANI (BURR) ×1 IMPLANT
CANISTER SUCTION 2500CC (MISCELLANEOUS) ×2 IMPLANT
CLIP TI MEDIUM 6 (CLIP) ×1 IMPLANT
CLOTH BEACON ORANGE TIMEOUT ST (SAFETY) ×2 IMPLANT
CONT SPEC 4OZ CLIKSEAL STRL BL (MISCELLANEOUS) ×5 IMPLANT
CORDS BIPOLAR (ELECTRODE) ×2 IMPLANT
COVER TABLE BACK 60X90 (DRAPES) ×1 IMPLANT
DECANTER SPIKE VIAL GLASS SM (MISCELLANEOUS) ×1 IMPLANT
DRAIN SNY WOU 7FLT (WOUND CARE) IMPLANT
DRAIN SUBARACHNOID (WOUND CARE) IMPLANT
DRAPE CAMERA VIDEO/LASER (DRAPES) IMPLANT
DRAPE LONG LASER MIC (DRAPES) IMPLANT
DRAPE MICROSCOPE LEICA (MISCELLANEOUS) IMPLANT
DRAPE NEUROLOGICAL W/INCISE (DRAPES) ×2 IMPLANT
DRAPE SURG IRRIG POUCH 19X23 (DRAPES) IMPLANT
DRAPE WARM FLUID 44X44 (DRAPE) ×2 IMPLANT
DRILL WIRE PASS 1.3MM (BIT)
DURAGUARD 06CMX08CM ×1 IMPLANT
DURAPREP 6ML APPLICATOR 50/CS (WOUND CARE) IMPLANT
ELECT CAUTERY BLADE 6.4 (BLADE) ×2 IMPLANT
ELECT REM PT RETURN 9FT ADLT (ELECTROSURGICAL) ×2
ELECTRODE REM PT RTRN 9FT ADLT (ELECTROSURGICAL) ×1 IMPLANT
EVACUATOR 1/8 PVC DRAIN (DRAIN) ×1 IMPLANT
EVACUATOR SILICONE 100CC (DRAIN) IMPLANT
GAUZE SPONGE 4X4 16PLY XRAY LF (GAUZE/BANDAGES/DRESSINGS) ×2 IMPLANT
GLOVE BIO SURGEON STRL SZ7 (GLOVE) ×2 IMPLANT
GLOVE BIOGEL M 8.0 STRL (GLOVE) ×2 IMPLANT
GLOVE BIOGEL PI IND STRL 7.5 (GLOVE) IMPLANT
GLOVE BIOGEL PI IND STRL 8 (GLOVE) IMPLANT
GLOVE BIOGEL PI INDICATOR 7.5 (GLOVE) ×2
GLOVE BIOGEL PI INDICATOR 8 (GLOVE) ×2
GLOVE ECLIPSE 8.5 STRL (GLOVE) ×1 IMPLANT
GLOVE EXAM NITRILE LRG STRL (GLOVE) ×1 IMPLANT
GLOVE EXAM NITRILE MD LF STRL (GLOVE) IMPLANT
GLOVE EXAM NITRILE XL STR (GLOVE) IMPLANT
GLOVE EXAM NITRILE XS STR PU (GLOVE) IMPLANT
GOWN BRE IMP SLV AUR LG STRL (GOWN DISPOSABLE) ×4 IMPLANT
GOWN BRE IMP SLV AUR XL STRL (GOWN DISPOSABLE) ×1 IMPLANT
GOWN STRL REIN 2XL LVL4 (GOWN DISPOSABLE) IMPLANT
HEMOSTAT POWDER SURGIFOAM 1G (HEMOSTASIS) ×2 IMPLANT
HEMOSTAT SURGICEL 2X14 (HEMOSTASIS) ×1 IMPLANT
HOOK DURA (MISCELLANEOUS) ×2 IMPLANT
KIT BASIN OR (CUSTOM PROCEDURE TRAY) ×2 IMPLANT
KIT ROOM TURNOVER OR (KITS) ×2 IMPLANT
MARKER SKIN DUAL TIP RULER LAB (MISCELLANEOUS) ×1 IMPLANT
NDL HYPO 25X1 1.5 SAFETY (NEEDLE) ×1 IMPLANT
NEEDLE HYPO 25X1 1.5 SAFETY (NEEDLE) ×2 IMPLANT
NS IRRIG 1000ML POUR BTL (IV SOLUTION) ×3 IMPLANT
PACK CRANIOTOMY (CUSTOM PROCEDURE TRAY) ×2 IMPLANT
PAD EYE OVAL STERILE LF (GAUZE/BANDAGES/DRESSINGS) IMPLANT
PATTIES SURGICAL .25X.25 (GAUZE/BANDAGES/DRESSINGS) IMPLANT
PATTIES SURGICAL .5 X.5 (GAUZE/BANDAGES/DRESSINGS) IMPLANT
PATTIES SURGICAL .5 X3 (DISPOSABLE) ×1 IMPLANT
PATTIES SURGICAL 1/4 X 3 (GAUZE/BANDAGES/DRESSINGS) IMPLANT
PATTIES SURGICAL 1X1 (DISPOSABLE) ×1 IMPLANT
PIN MAYFIELD SKULL DISP (PIN) ×1 IMPLANT
PLATE 1.5/0.5 13MM BURR HOLE (Plate) ×3 IMPLANT
RUBBERBAND STERILE (MISCELLANEOUS) ×2 IMPLANT
SCREW SELF DRILL HT 1.5/4MM (Screw) ×8 IMPLANT
SET TUBING W/EXT DISP (INSTRUMENTS) ×1 IMPLANT
SPONGE GAUZE 4X4 12PLY (GAUZE/BANDAGES/DRESSINGS) ×2 IMPLANT
SPONGE NEURO XRAY DETECT 1X3 (DISPOSABLE) ×1 IMPLANT
SPONGE SURGIFOAM ABS GEL 100 (HEMOSTASIS) ×1 IMPLANT
STAPLER SKIN PROX WIDE 3.9 (STAPLE) ×3 IMPLANT
SUT BONE WAX W31G (SUTURE) ×1 IMPLANT
SUT NURALON 4 0 TR CR/8 (SUTURE) ×5 IMPLANT
SUT SILK 0 TIES 10X30 (SUTURE) IMPLANT
SUT VIC AB 2-0 CP2 18 (SUTURE) ×3 IMPLANT
SYR 20ML ECCENTRIC (SYRINGE) ×2 IMPLANT
TAPE CLOTH SURG 4X10 WHT LF (GAUZE/BANDAGES/DRESSINGS) ×1 IMPLANT
TIP SONASTAR STD MISONIX 1.9 (TRAY / TRAY PROCEDURE) IMPLANT
TIP STRAIGHT 25KHZ (INSTRUMENTS) ×1 IMPLANT
TOWEL OR 17X24 6PK STRL BLUE (TOWEL DISPOSABLE) ×2 IMPLANT
TOWEL OR 17X26 10 PK STRL BLUE (TOWEL DISPOSABLE) ×2 IMPLANT
TRAY FOLEY CATH 16FRSI W/METER (SET/KITS/TRAYS/PACK) ×1 IMPLANT
TUBE CONNECTING 12X1/4 (SUCTIONS) ×2 IMPLANT
UNDERPAD 30X30 INCONTINENT (UNDERPADS AND DIAPERS) ×2 IMPLANT
WATER STERILE IRR 1000ML POUR (IV SOLUTION) ×2 IMPLANT

## 2013-04-27 NOTE — Preoperative (Signed)
Beta Blockers   Reason not to administer Beta Blockers:Not Applicable 

## 2013-04-27 NOTE — Progress Notes (Signed)
Family Medicine Teaching Service Daily Progress Note Intern Pager: [phone]  Patient name: William Townsend Medical record number: [id number] Date of birth: 21-Jul-1961 Age: 52 y.o. Gender: male  Primary Care Provider: Clare Gandy, MD Consultants: neurosurgery, neurology Code Status: DNR  Pt Overview and Major Events to Date:  8/14 - Neurosurgery plans to surgically resect occipital tumor pending improved DM control  8/15 - Increased Levemir 20 units daily (not BID as documented 8/16)  8/16 - increased Levemir to 30 units, decreased Decadron to 5 mg q6 8/17 - increased Levemir to 35u 8/18 - Increased Levemir to 30u BID - (60 total) / per report Neurosurgery plans for surg 8/19 8/18 - MEDICALLY CLEARED FOR SURGERY / EKG NSR / CBGs improved 8/19 - CBG 197 / pre-op - pending Left Occipital Craniotomy (resection of meningioma)  Assessment and Plan:  William Townsend is a 52 y.o. year old male presenting with right sided visual changes the last two days and headaches for past 2 months. PMH is significant for throat and lung cancer, HTN and diabetes. CT of head with occipital mass and xray with lung mass.   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 Consistent with occipital meningioma MRI Orbits - Negative MRI of the orbits without and with contrast. Visual symptoms persistent  - Neurology: signed off  - Neurosurgery: planning left occipital craniotomy 8/19 Pre-Op Clearance: per ACC/AHA Guidelines Head surgery is considered Intermediate Risk, with incidence of 1-5% risk of cardiac death or non-fatal MI. Hx of DM is a risk factor for this patient. (8/18) repeat EKG (pre-op clearance) - NSR, 61, no acute findings w/o arrhythmia - Continue Decadron 5 mg q 8 hours - indicated for focal vasogenic brain parenchymal edema  - Continue attempts to control DM control prior to surgery, (neurosurgery recs  CBGs <150s) (8/19) Per Neurosurg: NPO after midnight, ordered Ancef in AM (pre-op abx)  # Headaches, likely secondary to occipital meningioma Hx worsening headaches for past 2 months, with new recent R-visual changes. - plan for neurosurgical resection of occipital meningioma on (8/19) - will follow-up headaches for improvement post-op  - Pain: Morphine 2mg  Q3 PRN (received x6 doses 8/18). Norco 10 Q6PRN also available (8/19) +persistent HA during hospitalization, improved with morphine. Pending surgery in AM, will likely require new pain regimen.  # Metastatic Cancer, suspected primary throat vs lung CA (mets in LUL-Lung, Liver, Spleen)  - plan to refer to [facility] for further evaluation and determination of diagnostic and therapeutic approach to multiple metastatic tumors identified during hospitalization. Referral pending completion/recovery s/p meningioma resection  - discussed plan with patient, he is agreeable to this course, pleased with combining care in one location [ ]  contact [facility] to discuss case, acceptance, and coordination plans to follow-up s/p meningioma resection  # Diabetes: - Has had a history of diabetes, without medications d/t financial reasons (>72yr). A1C: 8.7. Increased CBG's with dexamethasone. Neurosurgery recommends to get CBGs < 150s prior to surgery - Carb modified diet - continue Resistant SSI, with bedtime coverage (8/18) 24hr CBGs range 280-330 (8/19) continue increased Levemir 30u BID, continue resistant SSI, QHS coverage  - considered IV insulin per DM coord recs, planned to continue subQ in AM  # HTN: Patient has been prescribed hypertensive medications in the past and then removed from them d/t hypotension.  - Will continue to monitor. Previously elevated BP, had been normal in office a few weeks  ago, concern these readings are d/t to pain and anxiety over hospitalization. - currently not on any BP meds - Hydralazine 5mg  IV q 4 hr PRN for BP >180/100 - 24 hr  BP stable 140s/80s w/ high systolic (175), HR stable 60s, other VSS (8/19) BP 116/75  # H/o of arrhythmia: Uncertain of history of arrhythmia, amiodarone had been listed in his medications of the past and he reports having had an "irregular heart rate" when asked, believes to be Paroxysmal AFib - EKG (in ED) - NSR, with PVCs - on telemetry  - Avoid anticoagulation for now (pending neurosurgery) (8/18) Repeat EKG - NSR, HR 61, no abnormalities, (no AFib or arrhythmia) - medically cleared for surgery  # Insomnia - Zolpidem 5mg  PRN  # Constipation: Patient reports this has been an issue since he was in the shelter. May need image study to rule out metastasis.  - continue Senakot and Miralax  FEN/GI: NS 100cc/hr / NPO-after midnight (previously Carb-modified diet) Prophylaxis: No Hep d/t potential brain surgery. SCD's ordered for now.   Disposition: Management as above. Discharge planning pending surgical intervention and any further work-up  Subjective: Patient is awaiting surgical transport this morning. He is sitting in chair at bedside with his son Fayrene Fearing at his side, who will wait in the hospital during the surgery and be present to receive updates post-op. He admits to persistent headache, relatively unchanged today, morphine is still helping pain but feels pain comes back sooner between doses now. Ambulating well, NPO since midnight, ready for surgery.  Objective: Temp:  [97.6 F (36.4 C)-98.2 F (36.8 C)] 98 F (36.7 C) (08/19 0452) Pulse Rate:  [56-64] 60 (08/19 0452) Resp:  [18-20] 18 (08/19 0452) BP: (116-175)/(75-89) 116/75 mmHg (08/19 0452) SpO2:  [100 %] 100 % (08/19 0452) Physical Exam: General: pleasant, sitting in chair at bedside awaiting transport, NAD HEENT: PERRLA, EOMI, conjunctival injection bilateral (unchanged today) Cardiovascular: S1S2, RRR, no murmur  Respiratory: CTAB Abdomen: soft, non tender, +BS Extremities: no edema, non-tender, moves all  ext Neuro: grossly non-focal, decreased peripheral vision in right lateral superior field, blurred vision persistent  Laboratory:  Recent Labs Lab 04/21/13 0815 04/21/13 0829 04/26/13 0615  WBC 4.2  --  9.1  HGB 16.6 16.0 18.2*  HCT 44.1 47.0 48.2  PLT 139*  --  174    Recent Labs Lab 04/21/13 1041  04/25/13 0528 04/26/13 0615 04/27/13 0345  NA 137  < > 133* 134* 135  K 3.5  < > 3.9 4.2 4.2  CL 100  < > 94* 97 100  CO2 27  < > 25 28 28   BUN 11  < > 27* 27* 27*  CREATININE 0.89  < > 0.88 0.89 0.85  CALCIUM 9.6  < > 9.3 9.4 9.1  PROT 6.7  --   --   --   --   BILITOT 0.7  --   --   --   --   ALKPHOS 122*  --   --   --   --   ALT 28  --   --   --   --   AST 29  --   --   --   --   GLUCOSE 247*  < > 316* 254* 249*  < > = values in this interval not displayed.  Imaging/Diagnostic Tests:  04/21/13 - MRI Brain w w/o contrast  IMPRESSION:  46 x 32 x 37 mm left occipital extra-axial mass with associated  edema but  no midline shift. Findings consistent with a left  occipital meningioma.  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 See discussion above.  04/21/13 - MRI Orbits - negative  04/21/13 Chest CT w contrast  1. There is nodular consolidation with some central air  bronchogram in the left upper lobe measures at least 3.3 x 3 cm.  This is highly suspicious for primary or secondary malignancy.  Further evaluation with bronchoscopy and/or biopsy is recommended.  2. Postsurgical changes are noted in the right upper lobe.  Spiculated scarring in the right upper lobe measures 2 x 1.8 cm.  3. No adenopathy.  4. There are low density lesions within liver the largest in right  hepatic lobe centrally measures 7.4 cm. Low density lesion with  anterior aspect of the spleen measures 4 cm. This are highly  suspicious for metastatic disease. Further evaluation is  recommended.  5. Small layering gallstones are noted within gallbladder. No   adrenal gland mass is noted. Degenerative changes mid and lower  thoracic spine.  8/18 EKG NSR, HR 61, no acute ST-T-wave changes, no abnormalities, unchanged from prior  Saralyn Pilar, DO 04/27/2013, 7:59 AM PGY-1, [facility] Family Medicine FPTS Intern pager: [phone], text pages welcome

## 2013-04-27 NOTE — Progress Notes (Signed)
Family Medicine Teaching Service Daily Progress Note Intern Pager: [phone]  Patient name: William Townsend Medical record number: [id number] Date of birth: 12-02-1960 Age: 52 y.o. Gender: male  Primary Care Provider: Clare Gandy, MD Consultants: neurosurgery, neurology Code Status: DNR  Pt Overview and Major Events to Date:  8/14 - Neurosurgery plans to surgically resect occipital tumor pending improved DM control  8/15 - Increased Levemir 20 units daily (not BID as documented 8/16)  8/16 - increased Levemir to 30 units, decreased Decadron to 5 mg q6 8/17 - increased Levemir to 35u 8/18 - Increased Levemir to 30u BID - (60 total) / per report Neurosurgery plans for surg 8/19 8/18 - MEDICALLY CLEARED FOR SURGERY / EKG NSR / CBGs improved 8/19 - CBG 197 / pre-op - pending Left Occipital Craniotomy (resection of meningioma) 8/20 - s/p Left Occipital Craniotomy (POD #1) / likely transfer from Neuro-ICU to Floor today  Assessment and Plan:  William Townsend is a 52 y.o. year old male presenting with right sided visual changes the last two days and headaches for past 2 months. PMH is significant for throat and lung cancer, HTN and diabetes. CT of head with occipital mass and xray with lung mass.   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 Consistent with occipital meningioma MRI Orbits - Negative MRI of the orbits without and with contrast. Visual symptoms persistent - repeat EKG (pre-op clearance) - NSR, 61, no acute findings w/o arrhythmia [ ]  f/u post-op Neurosurgery recs - (POD # 1) - likely transfer to floor today (possible discharge 1-2 days)  - (8/20) Decadron taper 4mg  q 6 --> (8/21) 4mg  q 8  - Keppra 500mg  q 12 - Pain: Morphine 4mg  q 3 PRN  # Headaches, likely secondary to occipital meningioma Hx worsening headaches for past 2 months, with new recent R-visual changes. - plan for  neurosurgical resection of occipital meningioma on (8/19) - will follow-up headaches for improvement post-op  (8/19) +persistent HA during hospitalization, improved with morphine. Pending surgery in AM, will likely require new pain regimen. 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 Referral pending completion/recovery s/p meningioma resection  - discussed plan with patient, he is agreeable to this course, pleased with combining care in one location [ ]  contact [facility] to discuss case, acceptance, and coordination plans to follow-up s/p meningioma resection  - called [facility] - provided patient information, they will follow pt's inpatient course and on discharge contact patient to schedule apt in their clinic to initiate course of therapy [ ]  contact SW re: arranging follow-up with [facility]  # Diabetes: - Has had a history of diabetes, without medications d/t financial reasons (>29yr). A1C: 8.7. Increased CBG's with dexamethasone. Neurosurgery recommends to get CBGs < 150s prior to surgery - Carb modified diet - continue Resistant SSI, with bedtime coverage - Last CBG 233, improving since taper of Decadron - continue to monitor - continue Levemir 30u BID, continue resistant SSI, QHS coverage (likely will start to decrease and assess insulin needs once Decadron reduced and DC'd)  # HTN: Patient has been prescribed hypertensive medications in the past and then removed  from them d/t hypotension.  - Will continue to monitor. Previously elevated BP, had been normal in office a few weeks ago, concern these readings are d/t to pain and anxiety over hospitalization. - currently not on any BP meds - Hydralazine 5mg  IV q 4 hr PRN for  BP >180/100 - 24 hr BP stable 100-120s / 60s, HR stable 50-60s, other VSS(8/19) BP 116/75  # H/o of arrhythmia: Uncertain of history of arrhythmia, amiodarone had been listed in his medications of the past and he reports having had an "irregular heart rate" when asked, believes to be Paroxysmal AFib - EKG (in ED) - NSR, with PVCs - on telemetry  - Avoid anticoagulation for now (pending neurosurgery) (8/18) Repeat EKG - NSR, HR 61, no abnormalities, (no AFib or arrhythmia) - medically cleared for surgery  # Insomnia - Zolpidem 5mg  PRN  # Constipation: Patient reports this has been an issue since he was in the shelter. May need image study to rule out metastasis.  - continue Senakot and Miralax  FEN/GI: NS 75cc/hr / Carb modified diet Prophylaxis: SCDs  Disposition: Management as above. Discharge planning pending recovery from surgery, plan to arrange follow-up with [facility] for further therapy plan regarding metastatic disease  Subjective: Patient sitting up in bed comfortably in Neuro-ICU unit this AM. He reports that overnight he accidentally removed his Hemovac drainage tube, without any drainage or problems. He was told there were no immediate concerns or plans to replace it overnight. Wound site has dry bandages w/o significant bleeding, leakage. Overall, he reports difficulty sleeping last night, still has headache but believes it is slightly improved (some worsening frontal headache). Otherwise, no complaints, feels good for day after surgery.  Denies - fevers/chills, CP, SoB, weakness, numbness, tingling, worsened vision changes.  Objective: Temp:  [96.8 F (36 C)-98.2 F (36.8 C)] 98.1 F (36.7 C) (08/20 0800) Pulse Rate:  [25-80] 56 (08/20 1000) Resp:  [0-23] 13 (08/20 1000) BP: (81-146)/(55-107) 123/67 mmHg (08/20 1000) SpO2:  [93 %-100 %] 98 % (08/20 1000) Arterial Line BP: (80-171)/(48-101) 82/61 mmHg (08/20 0900) Physical Exam: General: pleasant, sitting up in bed,  NAD HEENT: Clean, dry bandages taped in place L-posterior occiput (site of left craniotomy), PERRLA, EOMI Cardiovascular: S1S2, RRR, no murmur  Respiratory: CTAB, no crackles or wheezes Abdomen: soft, non tender, +BS Extremities: no edema, non-tender, moves all ext Neuro: grossly non-focal, mild improvement in vision (less blurred), muscle str 5/5 bilateral, no sensation loss, distal light touch intact  Laboratory:  Recent Labs Lab 04/26/13 0615 04/27/13 1114 04/28/13 0428  WBC 9.1 6.8 12.4*  HGB 18.2* 14.6 14.3  HCT 48.2 39.4 37.0*  PLT 174 140* 147*    Recent Labs Lab 04/21/13 1041  04/27/13 0345 04/27/13 1114 04/28/13 0428  NA 137  < > 135 136 133*  K 3.5  < > 4.2 3.9 3.9  CL 100  < > 100 105 99  CO2 27  < > 28 22 22   BUN 11  < > 27* 25* 21  CREATININE 0.89  < > 0.85 0.83 0.85  CALCIUM 9.6  < > 9.1 8.0* 8.0*  PROT 6.7  --   --   --   --   BILITOT 0.7  --   --   --   --   ALKPHOS 122*  --   --   --   --   ALT 28  --   --   --   --   AST 29  --   --   --   --  GLUCOSE 247*  < > 249* 151* 323*  < > = values in this interval not displayed.  8/19 MRSA PCR - negative  Imaging/Diagnostic Tests:  04/21/13 - MRI Brain w w/o contrast  IMPRESSION:  46 x 32 x 37 mm left occipital extra-axial mass with associated  edema but no midline shift. Findings consistent with a left  occipital meningioma.  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 See discussion above.  04/21/13 - MRI Orbits - negative  04/21/13 Chest CT w contrast  1. There is nodular consolidation with some central air  bronchogram in the left upper lobe measures at least 3.3 x 3 cm.  This is highly suspicious for primary or secondary malignancy.  Further evaluation with bronchoscopy and/or biopsy is recommended.  2. Postsurgical changes are noted in the right upper lobe.  Spiculated scarring in the right upper lobe measures 2 x 1.8 cm.  3. No adenopathy.  4. There are  low density lesions within liver the largest in right  hepatic lobe centrally measures 7.4 cm. Low density lesion with  anterior aspect of the spleen measures 4 cm. This are highly  suspicious for metastatic disease. Further evaluation is  recommended.  5. Small layering gallstones are noted within gallbladder. No  adrenal gland mass is noted. Degenerative changes mid and lower  thoracic spine.  8/18 EKG NSR, HR 61, no acute ST-T-wave changes, no abnormalities, unchanged from prior  8/19 Portable CXR IMPRESSION:  1. Right subclavian central line placement without pneumothorax.  2. Mass-like opacity in the left upper lobe.   Saralyn Pilar, DO 04/28/2013, 10:31 AM PGY-1, [facility] Family Medicine FPTS Intern pager: [phone], text pages welcome

## 2013-04-27 NOTE — Discharge Summary (Signed)
Family Medicine Teaching [facility] Discharge Summary  Patient name: William Townsend Medical record number: [id number] Date of birth: 04-03-1961 Age: 52 y.o. 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 year old male presenting with right sided visual changes the last two days and headaches for past 2 months. PMH is significant for throat and lung cancer, HTN and diabetes. CT of head with occipital mass, xray with lung mass, CT chest with lung, liver, spleen masses.  # Brain Tumor, left occipital - s/p Left craniotomy removal - (04/27/13) Presented with worsening headaches for past 2 months with new R-sided visual deficits. Initial work-up including CT Head showed significantly sized occipital brain mass. Started on dexamethasone to reduce edema, which made blood sugar difficult to treat prior to surgery. 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 Persistent HA post-op with some improvement, off Decadron, no complications, follow-up Neurosurg before 05/19/13 for staple removal.  # Metastatic Cancer, (mets in LUL-Lung, Liver, Spleen,  Brain) Suspected primary throat vs lung CA. Identified on initial imaging work-up with CT chest and head, in patient with hx of cancer. Primary focus of hospitalization was to surgically remove brain tumor, likely contributing to patient's only presenting symptoms. Decided not to go route of inpatient consult to oncology during hospitalization. Coordinated referral to [facility] [place]) for further evaluation and determination of diagnostic and therapeutic approach to multiple metastatic tumors identified during hospitalization.  # Headaches: Likely secondary to occipital brain mass. Reported hx of worsening for past 2 months. Mostly Left frontal, behind L-eye, extending around head like a "band". During hospitalization, controlled with morphine, and then transitioned to PO analgesia, with Percocet 5-325 on discharge.  # Diabetes: History of diabetes without medical management >53yr d/t financial reasons. HgbA1c 8.7. During hospitalization, dexamethasone made controlling blood sugar difficult, and patient required dramatic increase in insulin, ultimately controlled on Levemir 30u BID, resistant SSI, + bedtime coverage. On discontinuing steroid, insulin requirement reduced and CBGs 150s on 20u Levemir with minimal correction. Plan to discharge on Metformin 500mg  BID, Lantus 10-20u, and close follow-up to manage DM outpatient.  # HTN: Patient has been prescribed hypertensive medications in the past, but hx of not taking any medications >51yr. During hospitalization, blood pressure overall stable w/o significant need for daily or PRN anti-HTN meds. No meds added on discharge, plan to follow-up BP as outpatient.  # H/o of arrhythmia: Uncertain of history of arrhythmia, amiodarone had been listed in his medications of the past and he reports having had an "irregular heart rate" when asked, believes to be Paroxysmal AFib. EKG x2 nml, which cleared patient for  surgery. No events on telemetry. Unremarkable hospital course.  # Insomnia:  Given Zolpidem 5mg  PRN during hospitalization.   # Constipation: Patient reports this has been an issue since he  was in the shelter. May need image study to rule out metastasis. Improved with Senakot and Miralax.  Issues for Follow Up:  1. Metastatic Cancer - Will require follow-up and coordination with [facility]) for developing diagnostic and treatment plan of multiple metastatic tumors (lung, liver, spleen)  identified during hospitalization.  2. 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 Will need to be included in metastatic CA treatment plan.  3. Diabetes Management - Prior to hospitalization, pt not on any diabetic medications >52yr. History of tolerating Metformin well. CBGs difficult to control during hospitalization due to dexamethasone. On discharge given Metformin 500mg  BID, Lantus sample pen, Glucometer / test strip starter kit. Advised to take Metformin, and check CBGs to determine Lantus dose starting at 10u daily. Will need re-assessment of DM med requirements at outpt f/u  4. Pain Management - Re-assess pain from headaches (pre and post-op) and determine analgesic requirement. Given Percocet 5-325 1-2 tab q 6 PRN on discharge.  5. Social Situation - Currently residing with friends in [place], no permanent residence. Hx of med non-compliance due to unable to afford. Did not utilize [facility] program this admission, due to 1x use per year, and advised to save for potentially expensive chemotherapy.  6. Neurosurgery Post-op - Scheduled to follow-up with Dr. Jeral Fruit (Neurosurgery) by 05/19/13 for staple removal and post-op evaluation.  Significant Procedures:  8/19 - Left occipital craniotomy for resection of posterior brain tumor  Significant Labs and Imaging:    Recent Labs Lab 04/27/13 1114 04/28/13 0428 04/29/13 0930  WBC 6.8 12.4* 10.3  HGB 14.6 14.3 16.7  HCT 39.4 37.0* 44.6  PLT 140* 147* 181    Recent Labs Lab 04/26/13 0615 04/27/13 0345 04/27/13 1114 04/28/13 0428 04/29/13 0930  NA 134* 135 136 133* 132*  K 4.2 4.2 3.9 3.9 4.8  CL 97 100 105 99 94*  CO2 28 28 22 22 26   GLUCOSE 254* 249* 151* 323* 291*  BUN 27* 27* 25* 21 26*  CREATININE 0.89 0.85 0.83 0.85 0.97  CALCIUM 9.4 9.1 8.0* 8.0* 9.5   8/19 MRSA PCR - negative  Imaging/Diagnostic Tests:  04/21/13 - MRI Brain w w/o contrast  IMPRESSION:  46 x 32 x 37 mm left occipital extra-axial mass with associated  edema but no midline shift. Findings consistent with a left  occipital meningioma.  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 See discussion above.  04/21/13 - MRI Orbits - negative  04/21/13 Chest CT w contrast  1. There is nodular consolidation with some central air  bronchogram in the left upper lobe measures at least 3.3 x 3 cm.  This is highly suspicious for primary or secondary malignancy.  Further evaluation with bronchoscopy and/or biopsy is recommended.  2. Postsurgical changes are noted in the right upper lobe.  Spiculated scarring in the right upper lobe measures 2 x 1.8 cm.  3. No adenopathy.  4. There are low density lesions within liver the largest in right  hepatic lobe centrally measures 7.4 cm. Low density lesion with  anterior aspect of the spleen measures 4 cm. This are highly  suspicious for metastatic disease. Further evaluation is  recommended.  5. Small layering gallstones are noted within gallbladder. No  adrenal gland mass is noted. Degenerative changes mid and lower  thoracic spine.  8/18 EKG  NSR, HR 61, no acute ST-T-wave changes, no abnormalities, unchanged from prior  8/19 Portable CXR  IMPRESSION:  1. Right subclavian central line placement without pneumothorax.  2. Mass-like opacity  in the left upper lobe.  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  8/21 MRI w w/o contrast  IMPRESSION:  1. Status post resection of left occipital enhancing mass.  Preliminary pathology suggests malignancy rather than meningioma.  Minimal residual enhancement along the anterolateral resection  cavity (series 12, image 26) with attention directed on follow-up.  2. Decreased left posterior hemisphere edema. No significant mass  effect.  3. No new intracranial abnormality. 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  Outstanding Results: None  Discharge Medications:    Medication List    STOP taking these medications       HYDROcodone-acetaminophen 5-325 MG per tablet  Commonly known as:  NORCO/VICODIN      TAKE these medications       metFORMIN 500 MG tablet  Commonly known as:  GLUCOPHAGE  Take 1 tablet (500 mg total) by mouth 2 (two) times daily with a meal.     oxyCODONE-acetaminophen 5-325 MG per tablet  Commonly known as:  PERCOCET/ROXICET  Take 1-2 tablets by mouth every 6 (six) hours as needed.        Discharge Instructions: Please refer to Patient Instructions section of EMR for full details.  Patient was counseled important signs and symptoms that should prompt return to medical care, changes in medications, dietary instructions, activity restrictions, and follow up appointments.   Follow-Up Appointments: Follow-up Information   Follow up with Tana Conch, MD On 05/07/2013. (scheduled for 05/07/13 at 3:30pm)    Specialty:  Family Medicine   Contact information:   [address] [place] [address] [phone]       Follow up with Karn Cassis, MD. Schedule an appointment as soon as possible for a visit on 05/19/2013. (Please call to schedule  apt for 05/19/13 for Staple Removal)    Specialty:  Neurosurgery   Contact information:   [address] [place] [address] [phone]       Schedule an appointment as soon as possible for a visit with [facility]. (([phone] - Call on Monday if you have not been called about your appointment.)       Saralyn Pilar, DO 05/02/2013, 3:08 PM PGY-1, [facility]

## 2013-04-27 NOTE — OR Nursing (Signed)
Sent down a manual requisition down with the specimen due to printer not working. Req. # [id number]

## 2013-04-27 NOTE — Anesthesia Preprocedure Evaluation (Signed)
Anesthesia Evaluation  Patient identified by MRN, date of birth, ID band Patient awake    Reviewed: Allergy & Precautions, H&P , NPO status , Patient's Chart, lab work & pertinent test results  Airway Mallampati: I TM Distance: >3 FB Neck ROM: Full    Dental   Pulmonary COPDCurrent Smoker,  Lung ca + rhonchi   + decreased breath sounds      Cardiovascular hypertension, + dysrhythmias Atrial Fibrillation Rhythm:Irregular Rate:Normal     Neuro/Psych  Headaches, Depression CVA    GI/Hepatic (+) Cirrhosis -    substance abuse  alcohol use and marijuana use,   Endo/Other  diabetes, Poorly Controlled  Renal/GU      Musculoskeletal   Abdominal   Peds  Hematology   Anesthesia Other Findings   Reproductive/Obstetrics                           Anesthesia Physical Anesthesia Plan  ASA: IV  Anesthesia Plan: General   Post-op Pain Management:    Induction: Intravenous  Airway Management Planned: Oral ETT  Additional Equipment: Arterial line and CVP  Intra-op Plan:   Post-operative Plan: Possible Post-op intubation/ventilation  Informed Consent: I have reviewed the patients History and Physical, chart, labs and discussed the procedure including the risks, benefits and alternatives for the proposed anesthesia with the patient or authorized representative who has indicated his/her understanding and acceptance.     Plan Discussed with: CRNA and Surgeon  Anesthesia Plan Comments:         Anesthesia Quick Evaluation

## 2013-04-27 NOTE — Anesthesia Postprocedure Evaluation (Signed)
  Anesthesia Post-op Note  Patient: William Townsend  Procedure(s) Performed: Procedure(s): Left Occipital Craniotomy for resection of tumor with microscope (Left)  Patient Location: PACU  Anesthesia Type:General  Level of Consciousness: awake  Airway and Oxygen Therapy: Patient Spontanous Breathing  Post-op Pain: mild  Post-op Assessment: Post-op Vital signs reviewed, Patient's Cardiovascular Status Stable, Respiratory Function Stable, Patent Airway, No signs of Nausea or vomiting and Pain level controlled  Post-op Vital Signs: stable  Complications: No apparent anesthesia complications

## 2013-04-27 NOTE — Progress Notes (Signed)
Or note [id number]

## 2013-04-27 NOTE — Transfer of Care (Signed)
Immediate Anesthesia Transfer of Care Note  Patient: William Townsend  Procedure(s) Performed: Procedure(s): Left Occipital Craniotomy for resection of tumor with microscope (Left)  Patient Location: PACU  Anesthesia Type:General  Level of Consciousness: patient cooperative and responds to stimulation  Airway & Oxygen Therapy: Patient Spontanous Breathing and Patient connected to nasal cannula oxygen  Post-op Assessment: Report given to PACU RN, Post -op Vital signs reviewed and stable and Patient moving all extremities X 4  Post vital signs: Reviewed and stable  Complications: No apparent anesthesia complications

## 2013-04-27 NOTE — Progress Notes (Signed)
FMTS Attending Admission Note: William Misener,MD I  have seen and examined this patient, reviewed their chart. I have discussed this patient with the resident. I agree with the resident's findings, assessment and care plan.  

## 2013-04-27 NOTE — OR Nursing (Signed)
Sent a second manual requisition down for the second specimen. Req.# [id number]

## 2013-04-27 NOTE — Progress Notes (Signed)
Blood glucose elevations reported to FPTS intern at pager [phone]. Continue to monitor.

## 2013-04-28 ENCOUNTER — Encounter (HOSPITAL_COMMUNITY): Payer: Self-pay | Admitting: Neurosurgery

## 2013-04-28 LAB — GLUCOSE, CAPILLARY
Glucose-Capillary: 233 mg/dL — ABNORMAL HIGH (ref 70–99)
Glucose-Capillary: 294 mg/dL — ABNORMAL HIGH (ref 70–99)

## 2013-04-28 LAB — CBC
MCH: 33.6 pg (ref 26.0–34.0)
MCHC: 38.6 g/dL — ABNORMAL HIGH (ref 30.0–36.0)
MCV: 87.1 fL (ref 78.0–100.0)
Platelets: 147 10*3/uL — ABNORMAL LOW (ref 150–400)
RBC: 4.25 MIL/uL (ref 4.22–5.81)
RDW: 12.4 % (ref 11.5–15.5)

## 2013-04-28 LAB — BASIC METABOLIC PANEL
BUN: 21 mg/dL (ref 6–23)
CO2: 22 mEq/L (ref 19–32)
Calcium: 8 mg/dL — ABNORMAL LOW (ref 8.4–10.5)
Creatinine, Ser: 0.85 mg/dL (ref 0.50–1.35)
Glucose, Bld: 323 mg/dL — ABNORMAL HIGH (ref 70–99)

## 2013-04-28 MED ORDER — PANTOPRAZOLE SODIUM 40 MG PO TBEC
40.0000 mg | DELAYED_RELEASE_TABLET | Freq: Every day | ORAL | Status: DC
  Administered 2013-04-28 – 2013-05-01 (×3): 40 mg via ORAL
  Filled 2013-04-28 (×3): qty 1

## 2013-04-28 MED ORDER — LEVETIRACETAM 500 MG PO TABS
500.0000 mg | ORAL_TABLET | Freq: Two times a day (BID) | ORAL | Status: DC
  Administered 2013-04-28 – 2013-05-01 (×7): 500 mg via ORAL
  Filled 2013-04-28 (×8): qty 1

## 2013-04-28 NOTE — Progress Notes (Signed)
UR completed 

## 2013-04-28 NOTE — Progress Notes (Addendum)
Noticed patient had inadvertantly pulled his Hemovac drain.  Dressing still intact and clean.  Paged Dr. Jordan Likes to report findings.  Output of drain was 60ml for the shift. [name]  Dr. Jordan Likes returned call at 0130.  He was made aware of drain status but not concerned. William Townsend C

## 2013-04-28 NOTE — Progress Notes (Signed)
Patient ID: William Townsend, male   DOB: 04-19-1961, 52 y.o.   MRN: [id number] Patient doing well post-op. He will be going back to [facility], to the care of his MD(DR MCDIARMID). HE WILL NEED FURTHER W/U INCLUDING CHEST CT, ONCOLOGY CONSULT.

## 2013-04-28 NOTE — Progress Notes (Signed)
FMTS Attending Admission Note: Elisea Khader,MD I  have seen and examined this patient, reviewed their chart. I have discussed this patient with the resident. I agree with the resident's findings, assessment and care plan.  Day 1 po-op,patient tolerated surgery well,with high spirit this morning,he still has mild headache and pain from his surgery site. He denies any further vision change,no limb weakness, doing well in general.  Filed Vitals:   04/28/13 1100 04/28/13 1200 04/28/13 1216 04/28/13 1300  BP: 132/78 117/84    Pulse: 71 53  79  Temp:   97.6 F (36.4 C)   TempSrc:   Oral   Resp: 30 15  17   Height:      Weight:      SpO2: 98% 97%  100%   Exam:  Gen: Awake and alert,not in distress. Heent: PERRLA,EOMI.[place],occiput incision well dressed. Resp: Air entry equal b/l. Heart: S1 S2 normal,no murmurs. Abd: Benign. Ext: No edema.  A/P: Meningioma of left occipital region: S/P resection. Patient doing well, continue pain control. F/U Neuro recommendation. Still on Decadron and Keppra.  DM 2: Elevated serum glucose due to steroid. Consider going up on insulin dose once he start eating post op.  HTn: BP stable.

## 2013-04-28 NOTE — Op Note (Signed)
NAMELORAN, FLEET NO.:  [id number]  MEDICAL RECORD NO.:  [id number]  LOCATION:  OTFC                         FACILITY:  [facility]  PHYSICIAN:  Hilda Lias, M.D.   DATE OF BIRTH:  October 05, 1960  DATE OF PROCEDURE:  04/26/2013 DATE OF DISCHARGE:                              OPERATIVE REPORT   PREOPERATIVE DIAGNOSES: 1. 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 2. History of carcinoma of the throat and lung.  POSTOPERATIVE DIAGNOSES: 1. 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 2. History of carcinoma of the throat and lung.  PROCEDURE:  Left occipital craniotomy, resection of the tumor with the microscope and Cavitron.  Review of pathology report, malignant tumor, no meningioma.  SURGEON:  Hilda Lias, M.D.  ASSISTANT:  Dr. Jordan Likes.  CLINICAL HISTORY:  The patient was admitted to the medical service because of headache and difficulty seeing with the right side of the eye.  Workup showed that he has a large tumor in the left occipital area with _A SMALL ONE_________ in the 3rd ventricle.  This gentleman has a meningioma, but there was the possibility of metastasis because of the previous history.  Surgery was advised.  He knew the risk with the surgery.  PROCEDURE IN DETAIL:  The patient was taken to the OR and after intubation, 3 pins head holder was applied to the head and he was positioned in a prone manner.  The head was shaved and prepped with Betadine and DuraPrep.  Then drapes were applied.  Horseshoe type incision with a lower base was made from the right side of the occipital bone all the way up to the inion curve to the left, all the way down to the mastoid.  Raney clips were applied and the bone flap was positioned down with the lower base.  Then, we found the landmarks yet to prevent getting into the venous sinuses of the midline  all the transverse sinus. _burr holes_________ were made close to the sinuses and they were connected  with a craniotome.  The bone flap was elevated.  The dura mater was opened also and immediately when we were resecting, the tumor was attached to the dura mater and the dura mater surrounded the tumor need to be resected.  At the end, we were able to pinpoint the tumor.  The tumor was mostly going into the torcula with the help of the microscope of the cavity without dissecting the tumor away from the brain and the dissection, although was bloody, nevertheless was easy.  The specimen was sent to the lab.  ___gross _____findings that this was a meningioma, attached to the dura mater easily to dissect, but nevertheless the tumor came back as a malignant most likely coming from the lung.  At the end, we had good closed decompression.  Hemostasis was done with bipolar.  The area was irrigated.  We waited 10 minutes before we were able to close the brain.  DuraGen was used as replaced dura mater was attached to the edges of the peripheral dura using 4-0 silk.  The bone flap was pulled back in place using some screws.  Hemovac was left on the _subgaleal space_________ and the  skin was closed with Vicryl and staples.  The patient is going to go to the ICU at least overnight.          ______________________________ Hilda Lias, M.D.     EB/MEDQ  D:  04/27/2013  T:  04/27/2013  Job:  [id number]

## 2013-04-28 NOTE — Progress Notes (Signed)
Patient ID: William Townsend, male   DOB: [birth year], 52 y.o.   MRN: [id number] Doing well. C/o incisional pain. No weakness. Still with visual cahnges

## 2013-04-29 ENCOUNTER — Inpatient Hospital Stay (HOSPITAL_COMMUNITY): Payer: Self-pay

## 2013-04-29 LAB — GLUCOSE, CAPILLARY
Glucose-Capillary: 235 mg/dL — ABNORMAL HIGH (ref 70–99)
Glucose-Capillary: 315 mg/dL — ABNORMAL HIGH (ref 70–99)
Glucose-Capillary: 238 mg/dL — ABNORMAL HIGH (ref 70–99)
Glucose-Capillary: 208 mg/dL — ABNORMAL HIGH (ref 70–99)

## 2013-04-29 LAB — BASIC METABOLIC PANEL
BUN: 26 mg/dL — ABNORMAL HIGH (ref 6–23)
Chloride: 94 mEq/L — ABNORMAL LOW (ref 96–112)
Creatinine, Ser: 0.97 mg/dL (ref 0.50–1.35)
GFR calc non Af Amer: >90 mL/min (ref >90)
Glucose, Bld: 291 mg/dL — ABNORMAL HIGH (ref 70–99)
Potassium: 4.8 mEq/L (ref 3.5–5.1)

## 2013-04-29 LAB — TYPE AND SCREEN
Unit division: 0
Unit division: 0

## 2013-04-29 LAB — CBC WITH DIFFERENTIAL/PLATELET
Basophils Relative: 0 % (ref 0–1)
Eosinophils Absolute: 0 10*3/uL (ref 0.0–0.7)
HCT: 44.6 % (ref 39.0–52.0)
Hemoglobin: 16.7 g/dL (ref 13.0–17.0)
MCH: 33.2 pg (ref 26.0–34.0)
MCHC: 37.4 g/dL — ABNORMAL HIGH (ref 30.0–36.0)
Monocytes Absolute: 1.5 10*3/uL — ABNORMAL HIGH (ref 0.1–1.0)
Monocytes Relative: 14 % — ABNORMAL HIGH (ref 3–12)
RDW: 12.5 % (ref 11.5–15.5)

## 2013-04-29 MED ORDER — GADOBENATE DIMEGLUMINE 529 MG/ML IV SOLN
15.0000 mL | Freq: Once | INTRAVENOUS | Status: AC
  Administered 2013-04-29: 15 mL via INTRAVENOUS

## 2013-04-29 NOTE — Care Management Note (Signed)
    Page 1 of 2   04/30/2013     5:16:52 PM   CARE MANAGEMENT NOTE 04/30/2013  Patient:  William Townsend, William Townsend   Account Number:  [id number]  Date Initiated:  04/27/2013  Documentation initiated by:  Jiles Crocker  Subjective/Objective Assessment:   ADMITTED WITH Left occipital Brain mass with reactive vasogenic edema     Action/Plan:   Primary Care Tonita Bills: Clare Gandy, MD  CM FOLLOWING FOR DCP   Anticipated DC Date:  05/04/2013   Anticipated DC Plan:  HOME/SELF CARE      DC Planning Services  CM consult      Choice offered to / List presented to:             Status of service:  In process, will continue to follow Medicare Important Message given?  NA - LOS <3 / Initial given by admissions (If response is "NO", the following Medicare IM given date fields will be blank) Date Medicare IM given:   Date Additional Medicare IM given:    Discharge Disposition:    Per UR Regulation:  Reviewed for med. necessity/level of care/duration of stay  If discussed at Long Length of Stay Meetings, dates discussed:    Comments:  04/30/13 Elmer Bales RN, MSN, CM- Spoke with physician regarding medication assistance.  Patient will be going home on Metformin, which he can get for free at [employer].  He will be going home on Lantus, of which he has already recieved a free sample.  Patient will be going home with a Vicodin script, which patient states he can get assistance with since it is a temporary medication.  CM informed physician and patient that with a script, a blood glucose meter started kit can be obtained from the [facility].  CM verified with outpatient pharmacy that they have some currently in stock.  Patient will be following up at the [facility] as well as the [facility].  [facility] CSW is aware of patient.  04/29/13 1330 Elmer Bales RN, MSN, CM- Met with patient to discuss medication assistance needs.  CM left a voicemail message for the  CSW at [facility] Kathrin Penner) regarding the specifics of their medication assistance program.  Assuming the two programs do not overlap, CM will provide a [facility] letter for 34 days worth of his medications for home.  [facility] can only cover specific narcotics for post-op patients.  CM will place narcotic list on patient's shadow chart.  Patient will need individual scripts for a 34 day supply of each medication, including insulin needles/syringes.  Diabetic testing supplies are not covered, but CM can contact [facility] at discharge to see if they have any donated testing supplies/meters available.  Voicemail was left for financial counselor to discuss eligiabilty for Medicaid etc.  04/28/2013 Carlyle Lipa, RN BSN [facility] CCM [id number]--Pt to OR yesterday for excisiion of brain tumor. Arterial line in place and pt in neuro ICU.  04/27/2013- B CHANDLER RN,BSN,MHA

## 2013-04-29 NOTE — Clinical Social Work Note (Signed)
Clinical Social Worker received referral to address patient confirmation with follow up at [facility] at discharge.  CSW spoke with [facility] CSW who will check on patient status and follow up with him once treatments begin.  Clinical Social Worker will sign off for now as social work intervention is no longer needed during hospitalization. Please consult [place] again if new need arises.  Macario Golds, [place] [phone]

## 2013-04-29 NOTE — Progress Notes (Signed)
Doing well, decrease of headache.mri done. Oncology to see

## 2013-04-29 NOTE — Progress Notes (Signed)
Family Medicine Teaching Service Daily Progress Note Intern Pager: [phone]  Patient name: William Townsend Medical record number: [id number] Date of birth: 21-Jul-1961 Age: 52 y.o. Gender: male  Primary Care Provider: Clare Gandy, MD Consultants: neurosurgery, neurology Code Status: DNR  Pt Overview and Major Events to Date:  8/14 - Neurosurgery plans to surgically resect occipital tumor pending improved DM control  8/15 - Increased Levemir 20 units daily (not BID as documented 8/16)  8/16 - increased Levemir to 30 units, decreased Decadron to 5 mg q6 8/17 - increased Levemir to 35u 8/18 - Increased Levemir to 30u BID - (60 total) / per report Neurosurgery plans for surg 8/19 8/18 - MEDICALLY CLEARED FOR SURGERY / EKG NSR / CBGs improved 8/19 - CBG 197 / pre-op - pending Left Occipital Craniotomy (resection of meningioma) 8/20 - s/p Left Occipital Craniotomy (POD #1) / likely transfer from Neuro-ICU to Floor today 8/21 - POD #2 / SW - contact with [facility] / CM - med assistance  Assessment and Plan:  William Townsend is a 52 y.o. year old male presenting with right sided visual changes the last two days and headaches for past 2 months. PMH is significant for throat and lung cancer, HTN and diabetes. CT of head with occipital mass and xray with lung mass.   # Meningioma, left occipital - s/p Left craniotomy removal - (POD # 2) (04/21/13) MRI w w/o contrast - showed left occipital extra-axial mass with associated edema but no midline shift. Consistent with occipital meningioma MRI Orbits - Negative MRI of the orbits without and with contrast. Visual symptoms persistent - repeat EKG (pre-op clearance) - NSR, 61, no acute findings w/o arrhythmia [ ]  f/u post-op Neurosurgery recs - (POD # 2) - back on floor, f/u post-op recs   - DC'd Decadron (8/21)   - Keppra 500mg  q 12   - Pain: Morphine 4mg  q 3 PRN (last 8/20) - no doses today [ ]  f/u surgical pathology specimen    #  Headaches, likely secondary to occipital meningioma - s/p removal via L-craniotomy Hx worsening headaches for past 2 months, with new recent R-visual changes. (8/21) HA improved (with intermittent worsening in AM), HA no longer posterior location (pre-op), now more frontal with incisional tenderness. 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 Referral pending completion/recovery s/p meningioma resection  - discussed plan with patient, he is agreeable to this course, pleased with combining care in one location [ ]  contact [facility] to discuss case, acceptance, and coordination plans to follow-up s/p meningioma resection  - called [facility] - provided patient information, they will follow pt's inpatient course and on discharge contact patient to schedule apt in their clinic to initiate course of therapy [ ]  contact SW re: arranging follow-up with [facility]  # Diabetes: - Has had a history of diabetes, without medications d/t financial reasons (>75yr). A1C: 8.7. Increased CBG's with dexamethasone. Neurosurgery recommends to get CBGs < 150s prior to surgery - Carb modified diet - continue Resistant SSI, with bedtime coverage - Last CBG 238 - 291, improving s/p DC Decadron - continue Levemir 30u BID, continue resistant SSI, QHS coverage (8/20 total insulin - Levemir 60u + 36 Novolog) - trend for  24 hours off of Decadron to determine insulin requirement, likely discharge on Metformin with Lantus [ ]  Case Management - working on medical assistance program on discharge  # HTN: Patient has been prescribed hypertensive medications in the past and then removed from them d/t hypotension.  - Will continue to monitor.  Previously elevated BP, had been normal in office a few weeks ago, concern these readings are d/t to pain and anxiety over hospitalization. - currently not on any BP meds - Hydralazine 5mg  IV q 4 hr PRN for BP >180/100 - 24 hr BP stable 120s / 70-100s, HR stable 60-80s - consider need for anti-HTN med on discharge  # H/o of arrhythmia: Uncertain of history of arrhythmia, amiodarone had been listed in his medications of the past and he reports having had an "irregular heart rate" when asked, believes to be Paroxysmal AFib - EKG (in ED) - NSR, with PVCs - on telemetry  - Avoid anticoagulation for now (pending neurosurgery) (8/18) Repeat EKG - NSR, HR 61, no abnormalities, (no AFib or arrhythmia)  # Insomnia - Zolpidem 5mg  PRN  # Constipation: Patient reports this has been an issue since he was in the shelter. May need image study to rule out metastasis.  - continue Senakot and Miralax  FEN/GI: Saline Lock / Carb modified diet Prophylaxis: ambulation  Disposition: Management as above. Discharge planning pending post-op recovery, plan to arrange follow-up with [facility] for further therapy plan regarding metastatic disease  Subjective: Patient standing and ambulating in room. He states that overall he feels good today. His headache has improved, although he did have a period of worse HA yesterday. He has not received morphine since yesterday, and pain is controlled on Norco. He is awaiting further recommendations for post-op care. Still some incisional tenderness. Expresses interest in receiving aid for medication assistance and follow-up at [facility] on discharge. Confirms he has residence with friends in [place]. Vision has slightly improved  Denies - fevers/chills, CP, SoB, weakness, numbness, tingling, worsened vision changes.  Objective: Temp:  [97.4 F (36.3 C)-98.4 F (36.9 C)] 97.4 F (36.3 C) (08/21 1100) Pulse Rate:  [56-87] 78 (08/21 1100) Resp:  [14-20] 20  (08/21 1100) BP: (114-155)/(69-112) 129/112 mmHg (08/21 1100) SpO2:  [97 %-100 %] 100 % (08/21 1100) Physical Exam: General: pleasant, standing / ambulating, NAD HEENT: Clean, dry bandages taped in place L-posterior occiput (site of left craniotomy), PERRLA, EOMI  Cardiovascular: S1S2, RRR, no murmur  Respiratory: CTAB, no crackles or wheezes Abdomen: soft, non tender, +BS Extremities: no edema, non-tender, moves all ext Neuro: grossly non-focal, continued improvement in vision (less blurred), muscle str 5/5 bilateral, no sensation loss, distal light touch intact  Laboratory:  Recent Labs Lab 04/27/13 1114 04/28/13 0428 04/29/13 0930  WBC 6.8 12.4* 10.3  HGB 14.6 14.3 16.7  HCT 39.4 37.0* 44.6  PLT 140* 147* 181    Recent Labs Lab 04/27/13 1114 04/28/13 0428 04/29/13 0930  NA 136 133* 132*  K 3.9 3.9 4.8  CL 105 99 94*  CO2 22 22 26   BUN 25* 21 26*  CREATININE 0.83 0.85 0.97  CALCIUM 8.0* 8.0* 9.5  GLUCOSE 151* 323* 291*    8/19 MRSA PCR - negative  Imaging/Diagnostic Tests:  04/21/13 - MRI Brain w w/o contrast  IMPRESSION:  46 x 32 x 37 mm left occipital extra-axial mass with associated  edema but no midline shift. Findings consistent with a left  occipital meningioma.  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 See discussion above.  04/21/13 - MRI Orbits - negative  04/21/13 Chest CT w contrast  1. There is nodular consolidation with some central air  bronchogram in the left upper lobe measures at least 3.3 x 3 cm.  This is highly suspicious for primary or secondary malignancy.  Further evaluation with bronchoscopy and/or biopsy is recommended.  2. Postsurgical changes are noted in the right upper lobe.  Spiculated scarring in the right upper lobe measures 2 x 1.8 cm.  3. No adenopathy.  4. There are low density lesions within liver the largest in right  hepatic lobe centrally measures 7.4 cm. Low density lesion with   anterior aspect of the spleen measures 4 cm. This are highly  suspicious for metastatic disease. Further evaluation is  recommended.  5. Small layering gallstones are noted within gallbladder. No  adrenal gland mass is noted. Degenerative changes mid and lower  thoracic spine.  8/18 EKG NSR, HR 61, no acute ST-T-wave changes, no abnormalities, unchanged from prior  8/19 Portable CXR IMPRESSION:  1. Right subclavian central line placement without pneumothorax.  2. Mass-like opacity in the left upper lobe.  8/21 MRI w w/o contrast IMPRESSION:  1. Status post resection of left occipital enhancing mass.  Preliminary pathology suggests malignancy rather than meningioma.  Minimal residual enhancement along the anterolateral resection  cavity (series 12, image 26) with attention directed on follow-up.  2. Decreased left posterior hemisphere edema. No significant mass  effect.  3. No new intracranial abnormality. 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   Saralyn Pilar, DO 04/29/2013, 12:35 PM PGY-1, [facility] Family Medicine FPTS Intern pager: [phone], text pages welcome

## 2013-04-29 NOTE — Progress Notes (Signed)
FMTS Attending Admission Note: Dayanira Giovannetti,MD I  have seen and examined this patient, reviewed their chart. I have discussed this patient with the resident. I agree with the resident's findings, assessment and care plan.  

## 2013-04-30 LAB — GLUCOSE, CAPILLARY: Glucose-Capillary: 164 mg/dL — ABNORMAL HIGH (ref 70–99)

## 2013-04-30 MED ORDER — HYDROCODONE-ACETAMINOPHEN 10-325 MG PO TABS: 1.0000 | ORAL_TABLET | Freq: Four times a day (QID) | ORAL | Status: DC | PRN

## 2013-04-30 MED ORDER — HYDROCODONE-ACETAMINOPHEN 10-325 MG PO TABS
1.0000 | ORAL_TABLET | Freq: Four times a day (QID) | ORAL | Status: DC | PRN
  Administered 2013-04-30 – 2013-05-01 (×2): 1 via ORAL
  Filled 2013-04-30 (×2): qty 1

## 2013-04-30 MED ORDER — INSULIN DETEMIR 100 UNIT/ML ~~LOC~~ SOLN
20.0000 [IU] | Freq: Every day | SUBCUTANEOUS | Status: DC
  Administered 2013-04-30 – 2013-05-01 (×2): 20 [IU] via SUBCUTANEOUS
  Filled 2013-04-30 (×2): qty 0.2

## 2013-04-30 MED ORDER — INSULIN DETEMIR 100 UNIT/ML ~~LOC~~ SOLN: 25.0000 [IU] | Freq: Every day | SUBCUTANEOUS | Status: DC

## 2013-04-30 MED ORDER — INSULIN DETEMIR 100 UNIT/ML ~~LOC~~ SOLN
30.0000 [IU] | Freq: Every day | SUBCUTANEOUS | Status: DC
  Filled 2013-04-30: qty 0.3

## 2013-04-30 MED ORDER — METFORMIN HCL 500 MG PO TABS: 500.0000 mg | ORAL_TABLET | Freq: Two times a day (BID) | ORAL | Status: DC

## 2013-04-30 NOTE — Progress Notes (Signed)
Patient ID: William Townsend, male   DOB: Jul 11, 1961, 52 y.o.   MRN: [id number] Doing well. No headache. Wound dry. Can be discharge from my point of view . He needs to see me in 19 days to remove his staples

## 2013-04-30 NOTE — Progress Notes (Signed)
Family Medicine Teaching Service Daily Progress Note Intern Pager: [phone]  Patient name: William Townsend Medical record number: [id number] Date of birth: 01/23/1961 Age: 52 y.o. Gender: male  Primary Care Provider: Clare Gandy, MD Consultants: neurosurgery, neurology Code Status: DNR  Pt Overview and Major Events to Date:  8/14 - Neurosurgery plans to surgically resect occipital tumor pending improved DM control  8/15 - Increased Levemir 20 units daily (not BID as documented 8/16)  8/16 - increased Levemir to 30 units, decreased Decadron to 5 mg q6 8/17 - increased Levemir to 35u 8/18 - Increased Levemir to 30u BID - (60 total) / per report Neurosurgery plans for surg 8/19 8/18 - MEDICALLY CLEARED FOR SURGERY / EKG NSR / CBGs improved 8/19 - CBG 197 / pre-op - pending Left Occipital Craniotomy (resection of meningioma) 8/20 - s/p Left Occipital Craniotomy (POD #1) / likely transfer from Neuro-ICU to Floor today 8/21 - POD #2 / SW - contact with [facility] / CM - med assistance 8/22 - POD #3 / CBGs 80 - 200s, last CBG 155 / pending likely discharge today  Assessment and Plan:  William Townsend is a 52 y.o. year old male presenting with right sided visual changes the last two days and headaches for past 2 months. PMH is significant for throat and lung cancer, HTN and diabetes. CT of head with occipital mass and xray with lung mass.   # Brain Tumor, left occipital - s/p Left craniotomy removal - (POD # 3) (04/21/13) MRI w w/o contrast - showed left occipital extra-axial mass with associated edema but no midline shift. 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 (8/22 HA worsened today (reported to be worse than prior to when presented to hospital (intermittent worsening episodes) similar distribution of pain, Left frontal (behind L-eye) extending in band-like pattern across front, Left lateral, posterior. Noted tenderness over incision. 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 Referral pending completion/recovery s/p meningioma resection  - discussed plan with patient, he is agreeable to this course, pleased with combining care in one location [ ]  contact [facility] to discuss case, acceptance, and coordination plans to follow-up s/p meningioma resection  - called [facility] - provided patient information, they will follow pt's inpatient course and on discharge contact patient to schedule  apt in their clinic to initiate course of therapy [ ]  contact SW re: arranging follow-up with [facility]  - SW has connected with CSW at [facility], arrangements made  # Diabetes: - Has had a history of diabetes, without medications d/t financial reasons (>58yr). A1C: 8.7.  Increased CBG's with dexamethasone. Neurosurgery recommends to get CBGs < 150s prior to surgery - Carb modified diet - continue Resistant SSI, with bedtime coverage - Last CBG 80-150s, significant improvement s/p DC Decadron - continue Levemir 30u BID, continue resistant SSI, QHS coverage (8/21) total insulin - Levemir 60u + 11 Novolog) (8/22) Decreased Levemir to 20u in AM only - follow-up CBGs - Plan to discharge on Metformin + Lantus [ ]  Case Management - working on medical assistance program on discharge  # HTN: Patient has been prescribed hypertensive medications in the past and then removed from them d/t hypotension.  - Will continue to monitor. Previously elevated BP, had been normal in office a few weeks ago, concern these readings are d/t to pain and anxiety over hospitalization. - currently not on any BP meds - Hydralazine 5mg  IV q 4 hr PRN for BP >180/100 - 24 hr BP stable 120s / 70-100s, HR stable 60-80s - consider need for anti-HTN med on discharge  # H/o of arrhythmia: Uncertain of history of arrhythmia, amiodarone had been listed in his medications of the past and he reports having had an "irregular heart rate" when asked, believes to be Paroxysmal AFib - EKG (in ED) - NSR, with PVCs - on telemetry  - Avoid anticoagulation for now (pending neurosurgery) (8/18) Repeat EKG - NSR, HR 61, no abnormalities, (no AFib or arrhythmia)  # Insomnia - Zolpidem 5mg  PRN  # Constipation: Patient reports this has been an issue since he was in the shelter. May need image study to rule out metastasis.  - continue Senakot and Miralax  FEN/GI: Saline Lock / Carb modified diet Prophylaxis: ambulation  Disposition: Management as above. Discharge planning pending post-op recovery, plan to arrange follow-up with [facility] for further therapy plan regarding metastatic disease  Subjective: Patient reports significant worsening of headaches in past 24 hours. Distribution is similar to  previous HAs, left-eye frontal, extending in band-like across forehand and Left lateral to back of head. Notes that vision had worsened acutely yesterday when severe headache gradually occurred. Decreased response to Morphine and Norco. Currently while in room, his headaches are improved (recent dose of morphine + Norco). Vision has improved since yesterday, still blurry and double vision. He expresses concern about headaches, and that this is worse than when he presented to hospital. He states that the dressing has not been changed, but there has been no leakage. It is more tender when he slept on it overnight.  Admits +blurry and double vision, +HA - Denies - fevers/chills, CP, SoB, weakness, numbness, tingling, worsened vision changes.  Objective: Temp:  [97.9 F (36.6 C)-98.1 F (36.7 C)] 98.1 F (36.7 C) (08/22 1055) Pulse Rate:  [68-82] 70 (08/22 1055) Resp:  [18-20] 18 (08/22 1055) BP: (118-122)/(77-86) 122/77 mmHg (08/22 1055) SpO2:  [100 %] 100 % (08/22 1055) Physical Exam: General: pleasant, sitting in chair at bedside, NAD HEENT: Clean, dry bandages taped in place L-posterior occiput (site of left craniotomy), PERRLA, EOMI  Cardiovascular: S1S2, RRR, no murmur  Respiratory: CTAB, no crackles or wheezes Abdomen: soft, non tender, +BS Extremities: no edema, non-tender, moves all ext Neuro: grossly non-focal, continued improvement in vision (less blurred), muscle str 5/5 bilateral, no  sensation loss, distal light touch intact  Laboratory:  Recent Labs Lab 04/27/13 1114 04/28/13 0428 04/29/13 0930  WBC 6.8 12.4* 10.3  HGB 14.6 14.3 16.7  HCT 39.4 37.0* 44.6  PLT 140* 147* 181    Recent Labs Lab 04/27/13 1114 04/28/13 0428 04/29/13 0930  NA 136 133* 132*  K 3.9 3.9 4.8  CL 105 99 94*  CO2 22 22 26   BUN 25* 21 26*  CREATININE 0.83 0.85 0.97  CALCIUM 8.0* 8.0* 9.5  GLUCOSE 151* 323* 291*    8/19 MRSA PCR - negative  Imaging/Diagnostic Tests:  04/21/13 - MRI  Brain w w/o contrast  IMPRESSION:  46 x 32 x 37 mm left occipital extra-axial mass with associated  edema but no midline shift. Findings consistent with a left  occipital meningioma.  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 See discussion above.  04/21/13 - MRI Orbits - negative  04/21/13 Chest CT w contrast  1. There is nodular consolidation with some central air  bronchogram in the left upper lobe measures at least 3.3 x 3 cm.  This is highly suspicious for primary or secondary malignancy.  Further evaluation with bronchoscopy and/or biopsy is recommended.  2. Postsurgical changes are noted in the right upper lobe.  Spiculated scarring in the right upper lobe measures 2 x 1.8 cm.  3. No adenopathy.  4. There are low density lesions within liver the largest in right  hepatic lobe centrally measures 7.4 cm. Low density lesion with  anterior aspect of the spleen measures 4 cm. This are highly  suspicious for metastatic disease. Further evaluation is  recommended.  5. Small layering gallstones are noted within gallbladder. No  adrenal gland mass is noted. Degenerative changes mid and lower  thoracic spine.  8/18 EKG NSR, HR 61, no acute ST-T-wave changes, no abnormalities, unchanged from prior  8/19 Portable CXR IMPRESSION:  1. Right subclavian central line placement without pneumothorax.  2. Mass-like opacity in the left upper lobe.  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  8/21 MRI w w/o contrast IMPRESSION:  1. Status post resection of left occipital enhancing mass.  Preliminary pathology suggests malignancy rather than meningioma.  Minimal residual enhancement along the anterolateral resection  cavity (series 12, image 26) with  attention directed on follow-up.  2. Decreased left posterior hemisphere edema. No significant mass  effect.  3. No new intracranial abnormality. 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   Saralyn Pilar, DO 04/30/2013, 12:40 PM PGY-1, [facility] Family Medicine FPTS Intern pager: [phone], text pages welcome

## 2013-04-30 NOTE — Progress Notes (Signed)
Subjective: Patient reports no pain post op  Objective: Vital signs in last 24 hours: Temp:  [97.9 F (36.6 C)-98.4 F (36.9 C)] 98.4 F (36.9 C) (08/22 1407) Pulse Rate:  [68-82] 78 (08/22 1407) Resp:  [18-20] 18 (08/22 1407) BP: (118-139)/(77-86) 139/79 mmHg (08/22 1407) SpO2:  [98 %-100 %] 98 % (08/22 1407)  Intake/Output from previous day: 08/21 0701 - 08/22 0700 In: 243 [P.O.:240; I.V.:3] Out: -  Intake/Output this shift:    Vf cut in right side. No weakness  Lab Results:  Recent Labs  04/28/13 0428 04/29/13 0930  WBC 12.4* 10.3  HGB 14.3 16.7  HCT 37.0* 44.6  PLT 147* 181   BMET  Recent Labs  04/28/13 0428 04/29/13 0930  NA 133* 132*  K 3.9 4.8  CL 99 94*  CO2 22 26  GLUCOSE 323* 291*  BUN 21 26*  CREATININE 0.85 0.97  CALCIUM 8.0* 9.5    Studies/Results: Mr Lodema Pilot Contrast  04/29/2013   CLINICAL DATA:  52 year old male with history of left occipital brain mass. History of throat and lung carcinoma. Status post left occipital craniotomy and resection of tumor. Preliminary pathology "malignant tumor NOT meningioma" .  EXAM: MRI HEAD WITHOUT AND WITH CONTRAST  TECHNIQUE: Multiplanar, multiecho pulse sequences of the brain and surrounding structures were obtained according to standard protocol without and with intravenous contrast  CONTRAST:  15 mL MultiHance.  COMPARISON:  Preoperative brain MRI 04/21/2013.  FINDINGS: Sequelae of left posterior convexity craniotomy. Scalp ice pack incidentally noted. Heterogeneous but mostly T2 hyperintense resection cavity in the left occipital lobe corresponding to the site of the enhancing tumor on the preoperative study. Small volume postoperative blood products, some with intrinsic T1 hyperintensity. Following contrast, there is a small area of curvilinear enhancement along the antral lateral aspect of the resection cavity (series 12, image 26).  Overlying small postoperative extra-axial collection. Decreased posterior  left hemisphere edema surrounding the resection site. No significant mass effect.  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 This is not as well visualized on today's exam (series 12, image 29).  No new or additional enhancing brain lesion. No new signal abnormality. Stable nonenhancing T2 hyperintensity in the lower pons centrally. Ventricular size and configuration are within normal limits. Negative pituitary, cervicomedullary junction and visualized cervical spine. Punctate increased trace diffusion signal in the left lentiform nuclei on series 4, images 14 and 15 is not correlated on the ADC map or other sequences and is felt to reflect artifact. Overall, No restricted diffusion or evidence of acute infarction.  Visualized bone marrow signal is within normal limits. Visualized orbit soft tissues are within normal limits. Visualized paranasal sinuses and mastoids are clear. No unexpected scalp soft tissue findings.  IMPRESSION: 1. Status post resection of left occipital enhancing mass. Preliminary pathology suggests malignancy rather than meningioma. Minimal residual enhancement along the anterolateral resection cavity (series 12, image 26) with attention directed on follow-up.  2. Decreased left posterior hemisphere edema. No significant mass effect.  3. No new intracranial abnormality. 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   Electronically Signed   By: Augusto Gamble   On: 04/29/2013 08:39    Assessment/Plan:  to been by oncology and radiaton oncology LOS: 9 days     Shaynna Husby M 04/30/2013, 2:53 PM

## 2013-04-30 NOTE — Progress Notes (Signed)
FMTS Attending Admission Note: William Altemose,MD I  have seen and examined this patient, reviewed their chart. I have discussed this patient with the resident. I agree with the resident's findings, assessment and care plan.  Patient stated he still has headache which he feels has been worse since he had surgery but controlled on medication,he denies N/V,vision still remains blurry,no limb weakness or gait abnormality.  Filed Vitals:   04/29/13 1100 04/29/13 2203 04/30/13 0547 04/30/13 1055  BP: 129/112 118/86 119/79 122/77  Pulse: 78 68 82 70  Temp: 97.4 F (36.3 C) 97.9 F (36.6 C) 98 F (36.7 C) 98.1 F (36.7 C)  TempSrc: Oral Oral Oral Oral  Resp: 20 18 20 18   Height:      Weight:      SpO2: 100% 100% 100% 100%    Exam: Gen: Awake and alert,not in distress,oriented X 3. HEENT: Dressing over his occiput intact,no surrounding bleeding.PERRLA,EOMI. Resp: Air entry equal B/L Heart: S1 S2 no murmurs. Ext: No edema> Neuro: CN Intact,power 5/5 across all joint,reflexes intact,gait normal. No signs of meningeal irritation.  A/P; Brain mass: S/P resection, possible metastatic brain mass.                     F/U Pathology report.                     Neurology follow up post up.  Headache; No major improvement since surgery.                   This might be related to the surgery itself,intracranial bleed unlikely.                   MRI checked yesterday does not show any acute pathology.                   If headache worsens,to consider repeating imaging.                   Recommend contacting neurology for follow up.  DM: Glucose seem to be improving since d/c prednisone.         Currently on Lantus 30 unit BID and SSI,continue for now with close CBG monitoring.

## 2013-05-01 LAB — GLUCOSE, CAPILLARY
Glucose-Capillary: 190 mg/dL — ABNORMAL HIGH (ref 70–99)
Glucose-Capillary: 272 mg/dL — ABNORMAL HIGH (ref 70–99)

## 2013-05-01 MED ORDER — OXYCODONE-ACETAMINOPHEN 5-325 MG PO TABS
1.0000 | ORAL_TABLET | Freq: Four times a day (QID) | ORAL | Status: DC | PRN
  Administered 2013-05-01: 2 via ORAL
  Filled 2013-05-01: qty 2

## 2013-05-01 MED ORDER — OXYCODONE-ACETAMINOPHEN 5-325 MG PO TABS: 1.0000 | ORAL_TABLET | Freq: Four times a day (QID) | ORAL | Status: DC | PRN

## 2013-05-01 NOTE — Progress Notes (Signed)
Pt d/c to home by car with family. Assessment stable. Prescriptions given. Pt verbalizes understanding of d/c instructions. 

## 2013-05-01 NOTE — Progress Notes (Signed)
FMTS Attending Admission Note: Kaveon Blatz,MD I  have seen and examined this patient, reviewed their chart. I have discussed this patient with the resident. I agree with the resident's findings, assessment and care plan.  

## 2013-05-01 NOTE — Progress Notes (Signed)
Family Medicine Teaching Service Daily Progress Note Intern Pager: [phone]  Patient name: William Townsend Medical record number: [id number] Date of birth: [birth year] Age: 52 y.o. Gender: male  Primary Care Provider: Clare Gandy, MD Consultants: neurosurgery, neurology Code Status: DNR  Pt Overview and Major Events to Date:  8/14 - Neurosurgery plans to surgically resect occipital tumor pending improved DM control  8/15 - Increased Levemir 20 units daily (not BID as documented 8/16)  8/16 - increased Levemir to 30 units, decreased Decadron to 5 mg q6 8/17 - increased Levemir to 35u 8/18 - Increased Levemir to 30u BID - (60 total) / per report Neurosurgery plans for surg 8/19 8/18 - MEDICALLY CLEARED FOR SURGERY / EKG NSR / CBGs improved 8/19 - CBG 197 / pre-op - pending Left Occipital Craniotomy (resection of meningioma) 8/20 - s/p Left Occipital Craniotomy (POD #1) / likely transfer from Neuro-ICU to Floor today 8/21 - POD #2 / SW - contact with [facility] / CM - med assistance 8/22 - POD #3 / CBGs 80 - 200s, last CBG 155 / pending likely discharge today 8/23 - DC  Assessment and Plan: William Townsend is a 52 y.o. year old male presenting with right sided visual changes the last two days and headaches for past 2 months. PMH is significant for throat and lung cancer, HTN and diabetes. CT of head with occipital mass and xray with lung mass.   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 Consistent with possible occipital meningioma. MRI Orbits - Negative MRI of the orbits without and with contrast. Visual symptoms persistent. Neurosurgery has given the all clear for DC for f/u as outpt. Pt now off Decadron  - Continue Keppra 500mg  q  12    # Headaches: likely secondary to occipital meningioma - s/p removal via L-craniotomy. Ongoing for 33mo. No vision change today. Morphine and hydrocodone w/ relief.  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 Plan to refer to [facility] for further evaluation and determination of diagnostic and therapeutic approach to multiple metastatic tumors identified during hospitalization. [facility] - provided patient information, they will follow up w/ pt once discharged to continue workup/therapy - f/u w/ cancer center as outpt  # Diabetes: Has had a history of diabetes, without medications d/t financial reasons (>80yr). A1C: 8.7. Increased CBG's with dexamethasone.  - Carb modified diet - continue levemir and resistant SSI, with bedtime coverage - DC on home insulin  # HTN: Patient has been prescribed hypertensive medications in the past and then removed from them d/t hypotension.  - Will continue to monitor. Previously elevated BP, had been normal in office a few weeks ago, concern these readings are d/t to pain and anxiety over hospitalization. BP stable in past 24 hrs.  - f/u BP w/ PCP after discharge  # H/o of arrhythmia: Uncertain of history of arrhythmia, amiodarone had been listed in his medications of the past and he reports having had an "irregular heart rate" when asked, believes to be Paroxysmal AFib. EKG x2 nml. No events on telemetry  # Insomnia: - Zolpidem 5mg  PRN  # Constipation: Patient reports this has been an issue  since he was in the shelter. May need image study to rule out metastasis.  - continue Senakot and Miralax  FEN/GI: Saline Lock / Carb modified diet Prophylaxis: ambulation  Disposition: DC today  Subjective: Feeling well today. Continues to have HA. Ambulating w/o difficulty. HA controlled w/ vicodin and morphine. Feels ready to go home.   Objective: Temp:   [97.6 F (36.4 C)-98.4 F (36.9 C)] 98.3 F (36.8 C) (08/23 0947) Pulse Rate:  [70-104] 74 (08/23 0947) Resp:  [18-20] 20 (08/23 0947) BP: (107-177)/(72-102) 107/72 mmHg (08/23 0947) SpO2:  [98 %-100 %] 99 % (08/23 0947) Physical Exam: General: pleasant, sitting in chair at bedside, NAD HEENT: shaved from surgery, PERRLA, EOMI  Cardiovascular: S1S2, RRR, II/VI murmur  Respiratory: CTAB, no crackles or wheezes Abdomen: soft, non tender, +BS Extremities: no edema, non-tender, moves all ext Neuro: grossly non-focal, continued improvement in vision (less blurred), muscle str 5/5 bilateral, no sensation loss, distal light touch intact  Laboratory:  Recent Labs Lab 04/27/13 1114 04/28/13 0428 04/29/13 0930  WBC 6.8 12.4* 10.3  HGB 14.6 14.3 16.7  HCT 39.4 37.0* 44.6  PLT 140* 147* 181    Recent Labs Lab 04/27/13 1114 04/28/13 0428 04/29/13 0930  NA 136 133* 132*  K 3.9 3.9 4.8  CL 105 99 94*  CO2 22 22 26   BUN 25* 21 26*  CREATININE 0.83 0.85 0.97  CALCIUM 8.0* 8.0* 9.5  GLUCOSE 151* 323* 291*    8/19 MRSA PCR - negative  Imaging/Diagnostic Tests:  04/21/13 - MRI Brain w w/o contrast  IMPRESSION:  46 x 32 x 37 mm left occipital extra-axial mass with associated  edema but no midline shift. Findings consistent with a left  occipital meningioma.  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 See discussion above.  04/21/13 - MRI Orbits - negative  04/21/13 Chest CT w contrast  1. There is nodular consolidation with some central air  bronchogram in the left upper lobe measures at least 3.3 x 3 cm.  This is highly suspicious for primary or secondary malignancy.  Further evaluation with bronchoscopy and/or biopsy is recommended.  2. Postsurgical changes are noted in the right upper lobe.  Spiculated scarring in the right upper lobe measures 2 x 1.8 cm.  3. No adenopathy.  4. There are low density lesions within liver the largest in  right  hepatic lobe centrally measures 7.4 cm. Low density lesion with  anterior aspect of the spleen measures 4 cm. This are highly  suspicious for metastatic disease. Further evaluation is  recommended.  5. Small layering gallstones are noted within gallbladder. No  adrenal gland mass is noted. Degenerative changes mid and lower  thoracic spine.  8/18 EKG NSR, HR 61, no acute ST-T-wave changes, no abnormalities, unchanged from prior  8/19 Portable CXR IMPRESSION:  1. Right subclavian central line placement without pneumothorax.  2. Mass-like opacity in the left upper lobe.  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  8/21 MRI w w/o contrast IMPRESSION:  1. Status post resection of left occipital enhancing mass.  Preliminary pathology suggests malignancy rather than meningioma.  Minimal residual enhancement along the anterolateral resection  cavity (series 12, image 26) with attention directed on follow-up.  2. Decreased left posterior hemisphere edema. No significant mass  effect.  3. No new intracranial  abnormality. 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   Ozella Rocks, MD 05/01/2013, 10:30 AM PGY-3, [facility] Family Medicine FPTS Intern pager: [phone], text pages welcome

## 2013-05-02 NOTE — Discharge Summary (Signed)
FMTS Attending Admission Note: William Bowdish,MD I  have seen and examined this patient, reviewed their chart. I have discussed this patient with the resident. I agree with the resident's findings, assessment and care plan.  

## 2013-05-03 ENCOUNTER — Encounter: Payer: Self-pay | Admitting: Pharmacist

## 2013-05-03 ENCOUNTER — Telehealth: Payer: Self-pay | Admitting: *Deleted

## 2013-05-03 DIAGNOSIS — E119 Type 2 diabetes mellitus without complications: Secondary | ICD-10-CM

## 2013-05-03 NOTE — Telephone Encounter (Signed)
Called left vm message regarding appt for [facility] 05/06/13 at 4:00 arrive at 3:45

## 2013-05-03 NOTE — Assessment & Plan Note (Signed)
Medication Samples have been provided to the patient.  Drug name: Lantus Solostar  Qty: 1   LOT: [id number] Exp.Date: 09/09/2015  The patient has been instructed regarding the correct time, dose, and frequency of taking this medication, including desired effects and most common side effects.   Patient discharged with sample from hospital on 04/30/2013 with this sample. Plan follow-up at [facility] with Dr. Durene Cal 05/07/2013

## 2013-05-03 NOTE — Progress Notes (Signed)
Patient ID: William Townsend, male   DOB: 1961-06-03, 52 y.o.   MRN: [id number]     Medication Samples have been provided to the patient.  Drug name: Lantus Solostar  Qty: 1   LOT: [id number] Exp.Date: 09/09/2015  The patient has been instructed regarding the correct time, dose, and frequency of taking this medication, including desired effects and most common side effects.   Patient discharged with sample from hospital on 04/30/2013 with this sample. Plan follow-up at [facility] with Dr. Durene Cal 05/07/2013    Kathrin Ruddy 1:35 PM 05/03/2013

## 2013-05-04 ENCOUNTER — Telehealth: Payer: Self-pay | Admitting: Internal Medicine

## 2013-05-04 NOTE — Telephone Encounter (Signed)
C/D 05/04/13 for appt. 05/06/13

## 2013-05-06 ENCOUNTER — Encounter: Payer: Self-pay | Admitting: *Deleted

## 2013-05-06 ENCOUNTER — Telehealth: Payer: Self-pay | Admitting: *Deleted

## 2013-05-06 ENCOUNTER — Encounter: Payer: Self-pay | Admitting: Radiation Oncology

## 2013-05-06 ENCOUNTER — Ambulatory Visit
Admission: RE | Admit: 2013-05-06 | Discharge: 2013-05-06 | Disposition: A | Discharge status: Home / Self Care | Payer: Self-pay | Source: Ambulatory Visit | Attending: Radiation Oncology | Admitting: Radiation Oncology

## 2013-05-06 ENCOUNTER — Ambulatory Visit: Payer: Self-pay | Attending: Radiation Oncology | Admitting: Physical Therapy

## 2013-05-06 DIAGNOSIS — Z85118 Personal history of other malignant neoplasm of bronchus and lung: Secondary | ICD-10-CM

## 2013-05-06 DIAGNOSIS — Z85819 Personal history of malignant neoplasm of unspecified site of lip, oral cavity, and pharynx: Secondary | ICD-10-CM

## 2013-05-06 DIAGNOSIS — G9389 Other specified disorders of brain: Secondary | ICD-10-CM

## 2013-05-06 DIAGNOSIS — IMO0001 Reserved for inherently not codable concepts without codable children: Secondary | ICD-10-CM | POA: Insufficient documentation

## 2013-05-06 DIAGNOSIS — C3490 Malignant neoplasm of unspecified part of unspecified bronchus or lung: Secondary | ICD-10-CM | POA: Insufficient documentation

## 2013-05-06 DIAGNOSIS — C7931 Secondary malignant neoplasm of brain: Secondary | ICD-10-CM | POA: Insufficient documentation

## 2013-05-06 DIAGNOSIS — R293 Abnormal posture: Secondary | ICD-10-CM | POA: Insufficient documentation

## 2013-05-06 DIAGNOSIS — C7889 Secondary malignant neoplasm of other digestive organs: Secondary | ICD-10-CM | POA: Insufficient documentation

## 2013-05-06 NOTE — Progress Notes (Signed)
   Thoracic Treatment Summary Name:Taksh D Clapham Date:05/06/2013 DOB:[birth year] Your Medical Team Medical Oncologist: Dr. Arbutus Ped Radiation Oncologist: Dr. Kathrynn Running  Type and Stage of Lung Cancer  Small Cell Carcinoma:  Extensive Stage  Clinical stage is based on radiology exams.  Pathological stage will be determined after surgery.  Staging is based on the size of the tumor, involvement of lymph nodes or not, and whether or not the cancer center has spread. Recommendations Recommendations: Brain radiation therapy and then chemo  These recommendations are based on information available as of today's consult.  This is subject to change depending further testing or exams. Next Steps Next Step: 1. Radiation Oncology will set up radiation appointments [phone]  2. Medical Oncology will set up an appointment to see Dr. Arbutus Ped [phone]  Barriers to Care What do you perceive as a potential barrier that may prevent you from receiving your treatment plan? Nothing perceived at this time Resources: Cancer Care sss.cancercare.Ronney A[employer] Brain Tumor Society [phone] Smoking cessation 1-800-QUIT-NOW American Lung Association [phone] NCI booklet on lung cancer given American Cancer Society Smoking Cessation information given and explained Questions Willette Pa, RN BSN Thoracic Oncology Nurse Navigator at [phone]  Annabelle Harman is a nurse navigator that is available to assist you through your cancer journey.  She can answer your questions and/or provide resources regarding your treatment plan, emotional support, or financial concerns.

## 2013-05-06 NOTE — Progress Notes (Signed)
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 He lost health insurance in 10/10 when he lost his job and was lost to follow-up.  He presented on 04/21/13 with right sided visual changes for two days and headaches for 2 months. CT of head showed occipital mass and Chest CT showed Left upper lung mass, 2 liver lesions and a spleen lesion.  He underwent craniotomy and resection of he left occipital mass.  Pathology showed small cell lung cancer.   PREVIOUS RADIATION THERAPY: Yes head and neck radiotherapy in [place] through 2/09, records to be requested.  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    PAST SURGICAL HISTORY: Past Surgical History  Procedure Laterality Date  . Lobectomy    . Craniotomy Left 04/27/2013    Procedure: Left Occipital Craniotomy for resection of tumor with microscope;  Surgeon: Karn Cassis, MD;  Location: [facility];  Service: Neurosurgery;  Laterality: Left;    FAMILY HISTORY: family history includes Asthma in his sister; Depression in his sister; Diabetes in his mother; Early death in his mother; Heart disease in his father; Stroke in his father.  SOCIAL HISTORY:  reports that he has been smoking Cigarettes.  He has a 15 pack-year smoking  history. He has never used smokeless tobacco. He reports that  drinks alcohol. He reports that he uses illicit drugs (Marijuana).  ALLERGIES: Review of patient's allergies indicates no known allergies.  MEDICATIONS:  Current Outpatient Prescriptions  Medication Sig Dispense Refill  . metFORMIN (GLUCOPHAGE) 500 MG tablet Take 1 tablet (500 mg total) by mouth 2 (two) times daily with a meal.  60 tablet  3  . oxyCODONE-acetaminophen (PERCOCET/ROXICET) 5-325 MG per tablet Take 1-2 tablets by mouth every 6 (six) hours as needed.  30 tablet  0   No current facility-administered medications for this encounter.    REVIEW OF SYSTEMS:  A 15 point review of systems is documented in the electronic medical record. This was obtained by the nursing staff. However, I reviewed this with the patient to discuss relevant findings and make appropriate changes.  Pertinent items are noted in HPI.   PHYSICAL EXAM:  General: pleasant, sitting in chair at bedside, NAD  HEENT: shaved from surgery with stapled incision well-intended and no evidence of drainage or infection, PERRLA, EOMI  Cardiovascular: S1S2, RRR, II/VI murmur  Respiratory: CTAB, no crackles or wheezes  Abdomen: soft, non tender, +BS  Extremities: no edema, non-tender, moves all ext  Neuro: grossly non-focal, continued improvement in vision (less blurred), muscle str 5/5 bilateral, no sensation loss, distal light touch intact  KPS = 90  LABORATORY DATA:  Lab Results  Component Value Date   WBC 10.3 04/29/2013   HGB 16.7 04/29/2013   HCT 44.6 04/29/2013   MCV 88.7 04/29/2013  PLT 181 04/29/2013   Lab Results  Component Value Date   NA 132* 04/29/2013   K 4.8 04/29/2013   CL 94* 04/29/2013   CO2 26 04/29/2013   Lab Results  Component Value Date   ALT 28 04/21/2013   AST 29 04/21/2013   ALKPHOS 122* 04/21/2013   BILITOT 0.7 04/21/2013     RADIOGRAPHY: Dg Chest 2 View  04/21/2013   *RADIOLOGY REPORT*  Clinical Data: Near-syncope  CHEST - 2  VIEW  Comparison: None.  Findings: There is an apparent mass in the posterior segment left upper lobe measuring 4.8 x 2.7 x 3.2 cm in size.  There is postoperative change in the right upper lobe.  Elsewhere, lungs clear.  The heart size and pulmonary vascularity are normal.  No adenopathy.  No bone lesions.  IMPRESSION: Apparent mass posterior segment left upper lobe. Contrast enhanced chest CT advised to further assess. Postoperative change right lung.  Elsewhere, lungs appear clear.   Original Report Authenticated By: Bretta Bang, M.D.   Ct Head Wo Contrast  04/21/2013   *RADIOLOGY REPORT*  Clinical Data: Headache and near-syncope  CT HEAD WITHOUT CONTRAST  Technique:  Contiguous axial images were obtained from the base of the skull through the vertex without contrast. Study was obtained within 24 hours of patient arrival at the emergency department.  Comparison: None.  Findings: The ventricles are normal in size and configuration. There is a mass-like area with increased attenuation, probably containing hemorrhage, arising in the left occipital lobe measuring 5.1 x 2.9 cm.  There is surrounding vasogenic edema.  There is a 5 mm focus of increased attenuation at the level of the third ventricle.  This finding may indicate a small focus of hemorrhage.  There is no other evidence of mass or hemorrhage.  There is no extra-axial fluid or midline shift.  Elsewhere gray-white compartments are normal.  Bony calvarium appears intact.  The mastoid air cells are clear.  IMPRESSION: Mass with vasogenic edema that appears to contain a degree of hemorrhage in the left occipital lobe.  A neoplastic lesion is suspected.  MR correlation advised.  There is also an area of questionable hemorrhage along the superior aspect of the third ventricle.  Particular attention this area on MR also advised.   Original Report Authenticated By: Bretta Bang, M.D.   Ct Chest W Contrast  04/21/2013   *RADIOLOGY REPORT*  Clinical  Data: Abnormal chest x-ray, history of throat cancer  CT CHEST WITH CONTRAST  Technique:  Multidetector CT imaging of the chest was performed following the standard protocol during bolus administration of intravenous contrast.  Contrast: 80mL OMNIPAQUE IOHEXOL 300 MG/ML  SOLN  Comparison: Chest x-ray same day  Findings: Sagittal images of the spine shows degenerative changes mid and lower thoracic spine.  No destructive bony lesions are noted.  Images of the thoracic inlet are unremarkable.  Central pulmonary artery is unremarkable.  No aortic aneurysm.  Atherosclerotic calcifications of coronary arteries are noted.  Heart size within normal limits.  There is irregular low density lesion within right hepatic lobe centrally measures 7.4 cm.  Low density lesion in the right hepatic lobe anterior inferiorly chest inferior to gallbladder measures 4.6 cm.  This are highly suspicious for metastatic disease.  There is low density lesion anterior aspect of the spleen measures at least 4 cm.  This is highly suspicious for metastatic disease.  No adrenal gland mass is noted.  Layering small gallstones are noted within gallbladder.  As noted  on chest x-ray there is a nodular consolidation in the left upper lobe with some central air bronchogram.  Measures at least 3.3 x 3 cm.  This is highly suspicious for primary or secondary malignancy.  Further evaluation with bronchoscopy and/or biopsy is recommended.  There are postsurgical changes in the right upper lobe.  There is spiculated central scarring in the right upper lobe measures about 2 x 1.8 cm.  No hilar or mediastinal adenopathy.  Central airways are patent.  IMPRESSION:  1.  There is  nodular consolidation with some central air bronchogram in the left upper lobe measures at least 3.3 x 3 cm. This is highly suspicious for primary or secondary malignancy. Further evaluation with bronchoscopy and/or biopsy is recommended. 2.  Postsurgical changes are noted in the right  upper lobe. Spiculated scarring in the right upper lobe measures 2 x 1.8 cm. 3.  No adenopathy. 4.  There are low density lesions within liver the largest in right hepatic lobe centrally measures 7.4 cm.  Low density lesion with anterior aspect of the spleen measures 4 cm.  This are highly suspicious for metastatic disease.  Further evaluation is recommended. 5.  Small layering gallstones are noted within gallbladder.  No adrenal gland mass is noted.  Degenerative changes mid and lower thoracic spine.  Critical findings discussed with Dr.Pickering.   Original Report Authenticated By: Natasha Mead, M.D.   Mr Laqueta Jean Wo Contrast  04/29/2013   CLINICAL DATA:  52 year old male with history of left occipital brain mass. History of throat and lung carcinoma. Status post left occipital craniotomy and resection of tumor. Preliminary pathology "malignant tumor NOT meningioma" .  EXAM: MRI HEAD WITHOUT AND WITH CONTRAST  TECHNIQUE: Multiplanar, multiecho pulse sequences of the brain and surrounding structures were obtained according to standard protocol without and with intravenous contrast  CONTRAST:  15 mL MultiHance.  COMPARISON:  Preoperative brain MRI 04/21/2013.  FINDINGS: Sequelae of left posterior convexity craniotomy. Scalp ice pack incidentally noted. Heterogeneous but mostly T2 hyperintense resection cavity in the left occipital lobe corresponding to the site of the enhancing tumor on the preoperative study. Small volume postoperative blood products, some with intrinsic T1 hyperintensity. Following contrast, there is a small area of curvilinear enhancement along the antral lateral aspect of the resection cavity (series 12, image 26).  Overlying small postoperative extra-axial collection. Decreased posterior left hemisphere edema surrounding the resection site. No significant mass effect.  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 This is not as well visualized on  today's exam (series 12, image 29).  No new or additional enhancing brain lesion. No new signal abnormality. Stable nonenhancing T2 hyperintensity in the lower pons centrally. Ventricular size and configuration are within normal limits. Negative pituitary, cervicomedullary junction and visualized cervical spine. Punctate increased trace diffusion signal in the left lentiform nuclei on series 4, images 14 and 15 is not correlated on the ADC map or other sequences and is felt to reflect artifact. Overall, No restricted diffusion or evidence of acute infarction.  Visualized bone marrow signal is within normal limits. Visualized orbit soft tissues are within normal limits. Visualized paranasal sinuses and mastoids are clear. No unexpected scalp soft tissue findings.  IMPRESSION: 1. Status post resection of left occipital enhancing mass. Preliminary pathology suggests malignancy rather than meningioma. Minimal residual enhancement along the anterolateral resection cavity (series 12, image 26) with attention directed on follow-up.  2. Decreased left posterior hemisphere edema. No significant mass  effect.  3. No new intracranial abnormality. 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   Electronically Signed   By: Augusto Gamble   On: 04/29/2013 08:39   Mr Brain W Wo Contrast  04/21/2013   *RADIOLOGY REPORT*  Clinical Data:  Eye pain.  Brain mass.  Visual problems.  Syncope.  MRI HEAD AND ORBITS WITHOUT AND WITH CONTRAST  Technique:  Multiplanar, multiecho pulse sequences of the brain and surrounding structures were obtained without and with intravenous contrast. Multiplanar, multiecho pulse sequences of the orbits and surrounding structures were obtained including fat saturation techniques, before and after intravenous contrast administration.  Contrast: MultiHance 17 ml.  Comparison:  CT head 04/21/2013.  MRI HEAD  Findings:  A left occipital mass is redemonstrated, with enhancing cross-sectional measurements of 46 x 32 x 37  mm ( (R-L x A-P x C- C)).  The lesion appears extra-axial.  The lesion has intermediate signal on T1 and intermediate to low signal on T2-weighted images. Mild restricted diffusion.  Inward displacement of cortical vessels.  Dural tail on postcontrast imaging.  Slight central necrosis.  No adjacent hyperostosis of the calvarium.  No invasion of the adjacent venous sinuses.  Overall constellation of findings consistent with a left occipital meningioma.  Moderate associated vasogenic edema.  No intrinsic white matter disease. No acute stroke or hemorrhage.  No hydrocephalus or extra- axial fluid.  In the anterior third ventricle, near the foramen of Monro, there is a 4 mm solid lesion corresponding with the hyperdense abnormality noted on CT.  Mild post contrast enhancement. Considerations would include a second meningioma (intraventricular),  a small colloid cyst, or subependymoma.  There is no associated edema or incipient ventricular obstruction.  Flow voids are maintained in the major intracranial vessels. Calvarium appears intact. Sinus or mastoid disease.  IMPRESSION: 46 x 32 x 37 mm left occipital extra-axial mass with associated edema but no midline shift.  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 See discussion above.  MRI ORBITS  Findings: The globes are symmetric and within normal limits. Normal appearing optic nerves.  Extraocular muscles intact.  No preseptal or post-septal mass.  No significant paranasal sinus disease.  Normal orbital fat.  No abnormal post contrast enhancement on fat saturated images.  IMPRESSION: Negative MRI of the orbits without and with contrast.   Original Report Authenticated By: Davonna Belling, M.D.   Dg Chest Port 1 View  04/27/2013   *RADIOLOGY REPORT*  Clinical Data: Central line placement.  PORTABLE CHEST - 1 VIEW  Comparison: CT chest and chest radiograph 04/21/2013.  Findings: Trachea  is midline.  Heart size is accentuated by AP technique and low lung volumes.  Right subclavian central line tip projects near the junction of the brachiocephalic veins.  There are postoperative changes in the upper right hemithorax. A mass-like opacity is seen in the left upper lobe.  No pneumothorax.  No pleural fluid.  IMPRESSION:  1.  Right subclavian central line placement without pneumothorax. 2.  Mass-like opacity in the left upper lobe.   Original Report Authenticated By: Leanna Battles, M.D.   Mr Orbits Wo/w Cm  04/21/2013   *RADIOLOGY REPORT*  Clinical Data:  Eye pain.  Brain mass.  Visual problems.  Syncope.  MRI HEAD AND ORBITS WITHOUT AND WITH CONTRAST  Technique:  Multiplanar, multiecho pulse sequences of the brain and surrounding structures were obtained without and with intravenous contrast. Multiplanar, multiecho pulse sequences of the  orbits and surrounding structures were obtained including fat saturation techniques, before and after intravenous contrast administration.  Contrast: MultiHance 17 ml.  Comparison:  CT head 04/21/2013.  MRI HEAD  Findings:  A left occipital mass is redemonstrated, with enhancing cross-sectional measurements of 46 x 32 x 37 mm ( (R-L x A-P x C- C)).  The lesion appears extra-axial.  The lesion has intermediate signal on T1 and intermediate to low signal on T2-weighted images. Mild restricted diffusion.  Inward displacement of cortical vessels.  Dural tail on postcontrast imaging.  Slight central necrosis.  No adjacent hyperostosis of the calvarium.  No invasion of the adjacent venous sinuses.  Overall constellation of findings consistent with a left occipital meningioma.  Moderate associated vasogenic edema.  No intrinsic white matter disease. No acute stroke or hemorrhage.  No hydrocephalus or extra- axial fluid.  In the anterior third ventricle, near the foramen of Monro, there is a 4 mm solid lesion corresponding with the hyperdense abnormality noted on CT.  Mild  post contrast enhancement. Considerations would include a second meningioma (intraventricular),  a small colloid cyst, or subependymoma.  There is no associated edema or incipient ventricular obstruction.  Flow voids are maintained in the major intracranial vessels. Calvarium appears intact. Sinus or mastoid disease.  IMPRESSION: 46 x 32 x 37 mm left occipital extra-axial mass with associated edema but no midline shift.  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 See discussion above.  MRI ORBITS  Findings: The globes are symmetric and within normal limits. Normal appearing optic nerves.  Extraocular muscles intact.  No preseptal or post-septal mass.  No significant paranasal sinus disease.  Normal orbital fat.  No abnormal post contrast enhancement on fat saturated images.  IMPRESSION: Negative MRI of the orbits without and with contrast.   Original Report Authenticated By: Davonna Belling, M.D.      IMPRESSION: This is a very nice 52 yo man with extensive stage small cell lung cancer s/p resection of a brain metastasis. He will benefit from whole brain radiotherapy to reduce the risk of recurrence of his resected metastasis and help reduce the likelihood of new metastases.  He does not have bulky intrathoracic disease currently, and he is asymptomatic from extracranial disease, so, there is not a role for thoracic or other radiotherapy at this time.  He will likely be a candidate for chemo, pending med-onc consult.  PLAN:Today, I talked to the patient and family about the findings and work-up thus far.  We discussed the natural history of disease and general treatment, highlighting the role or radiotherapy in the management.  We discussed the available radiation techniques, and focused on the details of logistics and delivery.  We reviewed the anticipated acute and late sequelae associated with radiation in this setting.  The  patient was encouraged to ask questions that I answered to the best of my ability.  I filled out a patient counseling form during our discussion including treatment diagrams.  We retained a copy for our records.  The patient would like to proceed with radiation and will be scheduled for CT simulation tomorrow.  We will delay radiation until after his staples have been removed.  I spent 60 minutes minutes face to face with the patient and more than 50% of that time was spent in counseling and/or coordination of care.    ------------------------------------------------  [name]. Kathrynn Running, M.D.

## 2013-05-06 NOTE — Telephone Encounter (Signed)
Tried to call pt, unable to reach to notify him Dr. Arbutus Ped is out sick. Pt will see Dr. Kathrynn Running today at [facility]

## 2013-05-07 ENCOUNTER — Ambulatory Visit
Admission: RE | Admit: 2013-05-07 | Discharge: 2013-05-07 | Disposition: A | Discharge status: Home / Self Care | Payer: Self-pay | Source: Ambulatory Visit | Attending: Radiation Oncology | Admitting: Radiation Oncology

## 2013-05-07 ENCOUNTER — Encounter: Payer: Self-pay | Admitting: *Deleted

## 2013-05-07 ENCOUNTER — Ambulatory Visit
Admission: RE | Admit: 2013-05-07 | Discharge: 2013-05-07 | Disposition: A | Discharge status: Home / Self Care | Payer: Medicaid Other | Source: Ambulatory Visit | Attending: Radiation Oncology | Admitting: Radiation Oncology

## 2013-05-07 ENCOUNTER — Inpatient Hospital Stay: Payer: Self-pay | Admitting: Family Medicine

## 2013-05-07 VITALS — BP 165/93 | HR 89 | Temp 98.1°F | Resp 16 | Ht 72.0 in | Wt 173.6 lb

## 2013-05-07 DIAGNOSIS — H538 Other visual disturbances: Secondary | ICD-10-CM | POA: Insufficient documentation

## 2013-05-07 DIAGNOSIS — C7931 Secondary malignant neoplasm of brain: Secondary | ICD-10-CM | POA: Insufficient documentation

## 2013-05-07 DIAGNOSIS — G9389 Other specified disorders of brain: Secondary | ICD-10-CM

## 2013-05-07 DIAGNOSIS — R51 Headache: Secondary | ICD-10-CM | POA: Insufficient documentation

## 2013-05-07 DIAGNOSIS — R42 Dizziness and giddiness: Secondary | ICD-10-CM | POA: Insufficient documentation

## 2013-05-07 DIAGNOSIS — Z85118 Personal history of other malignant neoplasm of bronchus and lung: Secondary | ICD-10-CM

## 2013-05-07 DIAGNOSIS — Z510 Encounter for antineoplastic radiation therapy: Secondary | ICD-10-CM | POA: Insufficient documentation

## 2013-05-07 DIAGNOSIS — C3490 Malignant neoplasm of unspecified part of unspecified bronchus or lung: Secondary | ICD-10-CM | POA: Insufficient documentation

## 2013-05-07 DIAGNOSIS — R110 Nausea: Secondary | ICD-10-CM | POA: Insufficient documentation

## 2013-05-07 NOTE — Progress Notes (Signed)
Flat affect noted. Response to questions are quick and abrupt. Denies pain. Denies taking any medication at this time. Denies allergies. Reports that he has questions for Dr. Kathrynn Running. Explained Dr. Kathrynn Running will be with him during his simulation and be more than glad to answer any questions he has. Escorted patient to CT/SIM.

## 2013-05-07 NOTE — Progress Notes (Signed)
  Radiation Oncology         [phone] ________________________________  Name: William Townsend MRN: [id number]  Date: 05/07/2013  DOB: 1961-01-10  SIMULATION AND TREATMENT PLANNING NOTE  DIAGNOSIS:  52 yo man with small cell lung cancer s/p resection of a brain met.  NARRATIVE:  The patient was brought to the CT Simulation planning suite.  Identity was confirmed.  All relevant records and images related to the planned course of therapy were reviewed.  The patient freely provided informed written consent to proceed with treatment after reviewing the details related to the planned course of therapy. The consent form was witnessed and verified by the simulation staff.  Then, the patient was set-up in a stable reproducible  supine position for radiation therapy.  CT images were obtained.  Surface markings were placed.  The CT images were loaded into the planning software.  Then the target and avoidance structures were contoured.  Treatment planning then occurred.  The radiation prescription was entered and confirmed.  Then, I designed and supervised the construction of a total of 3 medically necessary complex treatment devices including 1 thermoplastic mask for head positioning and 2 multileaf collimators to shape radiation around the whole brain from the right and left angle..  I have requested : Isodose Plan.   PLAN:  The patient will receive 30 Gy in 10 fractions.  ________________________________  [name] Kathrynn Running, M.D.

## 2013-05-07 NOTE — Progress Notes (Signed)
Spoke with Dr. Cassandria Santee office regarding when pt staples can come out.  Nurse states she has been trying to reach pt and they can come out.  I spoke with pt and notified him that staples can come out and to call Dr. Cassandria Santee office (gave him phone number to office).  He verbalized understanding

## 2013-05-12 ENCOUNTER — Ambulatory Visit (INDEPENDENT_AMBULATORY_CARE_PROVIDER_SITE_OTHER): Payer: Self-pay | Admitting: Family Medicine

## 2013-05-12 VITALS — BP 142/93 | HR 94 | Temp 98.1°F | Ht 72.0 in | Wt 168.4 lb

## 2013-05-12 DIAGNOSIS — R51 Headache: Secondary | ICD-10-CM

## 2013-05-12 MED ORDER — OXYCODONE-ACETAMINOPHEN 5-325 MG PO TABS
1.0000 | ORAL_TABLET | Freq: Four times a day (QID) | ORAL | Status: DC | PRN
Start: 2013-05-12 — End: 2013-06-14

## 2013-05-12 NOTE — Assessment & Plan Note (Signed)
headache s/p brain mass resection. Latest MRI from 04/28/13 showing post surgical changes. No focal findings on exam. Patient has scheduled brain radiation next Monday and will therefore have close followup there as well.  Given that he is a cancer patient with pain related to cancer, will be more liberal regarding pain medicine and making patient comfortable. Rx for percoet 5/325 q6/prn 90 tablets no refills. Patient will need to make appointment with PCP for his next refill.

## 2013-05-12 NOTE — Patient Instructions (Addendum)
Like we talked about, if the headache gets worst, if you start having trouble with vision, nausea or vomiting, please seek medical care.

## 2013-05-12 NOTE — Progress Notes (Signed)
Patient ID: [name]    DOB: 15-Jan-1961, 52 y.o.   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  - headache: patient initially presented with left sided headachae and blurred vision when he was first found to have brain mass. Following resection, he continues having headaches which were controled with morphine IV post op. He was discharged from the hospital with percocet which has helped with the headache. Patient states that he takes 1/2 percoet 5/325 every 3 hrs which helps bring pain to 0-1/10. He ran out of percoet on Saturday and since then, he has had left sided frontal and parietal headache (same location as before) 7/10 without medicine and 4/10 with ibprofen (he has been taking 6 per day)pain is constant, pressure like. It is no longer associated with blurred or double vision like he had prior to resection. He has had one episode of nausea this morning but otherwise, no nausea or vomiting. He denies any weakness.     ROS: see HPI Past Medical History: reviewed and updated medications and allergies. Social History: Tobacco: 1/2 pack per day  Objective: Filed Vitals:   05/12/13 1033  BP: 142/93  Pulse: 94  Temp: 98.1 F (36.7 C)    Physical Examination:   General appearance - alert, well appearing, and in no distress Chest - clear to auscultation, no wheezes, rales or rhonchi, symmetric air entry Heart - normal rate, regular rhythm, normal S1, S2, no murmurs, rubs, clicks or gallops Neuro - CN2-12 grossly intact, normal strength with gripping, biceps and triceps. Normal peripheral vision. Normal fast alternating hand movement bilaterally, some intentional tremor on right with finger to nose which he states he has always trouble with.    BRAIN MRI 04/28/13 FINDINGS:  Sequelae of left posterior convexity craniotomy. Scalp ice pack   incidentally noted. Heterogeneous but mostly T2 hyperintense  resection cavity in the left occipital lobe corresponding to the  site of the enhancing tumor on the preoperative study. Small volume  postoperative blood products, some with intrinsic T1 hyperintensity.  Following contrast, there is a small area of curvilinear enhancement  along the antral lateral aspect of the resection cavity (series 12,  image 26).  Overlying small postoperative extra-axial collection. Decreased  posterior left hemisphere edema surrounding the resection site. No  significant mass effect.  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 This is not as well visualized on today's  exam (series 12, image 29).  No new or additional enhancing brain lesion. No new signal  abnormality. Stable nonenhancing T2 hyperintensity in the lower pons  centrally. Ventricular size and configuration are within normal  limits. Negative pituitary, cervicomedullary junction and visualized  cervical spine. Punctate increased trace diffusion signal in the  left lentiform nuclei on series 4, images 14 and 15 is not  correlated on the ADC map or other sequences and is felt to reflect  artifact. Overall, No restricted diffusion or evidence of acute  infarction.  Visualized bone marrow signal is within normal limits. Visualized  orbit soft tissues are within normal limits. Visualized paranasal  sinuses and mastoids are clear. No unexpected scalp soft tissue  findings.  IMPRESSION:  1. Status post resection of left occipital enhancing mass.  Preliminary pathology suggests malignancy rather than meningioma.  Minimal residual enhancement along the anterolateral resection  cavity (  series 12, image 26) with attention directed on follow-up.  2. Decreased left posterior hemisphere edema. No significant mass  effect.  3. No new intracranial abnormality. 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  Electronically Signed  By: Augusto Gamble  On: 04/29/2013 08:39

## 2013-05-16 DIAGNOSIS — C7931 Secondary malignant neoplasm of brain: Secondary | ICD-10-CM | POA: Insufficient documentation

## 2013-05-16 NOTE — Progress Notes (Signed)
  Radiation Oncology         [phone] ________________________________  Name: William Townsend MRN: [id number]  Date: 05/17/2013  DOB: 1960/09/28  Simulation Verification Note  Status: outpatient  NARRATIVE: The patient was brought to the treatment unit and placed in the planned treatment position. The clinical setup was verified. Then port films were obtained and uploaded to the radiation oncology medical record software.  The treatment beams were carefully compared against the planned radiation fields. The position location and shape of the radiation fields was reviewed. They targeted volume of tissue appears to be appropriately covered by the radiation beams. Organs at risk appear to be excluded as planned.  Based on my personal review, I approved the simulation verification. The patient's treatment will proceed as planned.  ------------------------------------------------  [name] Kathrynn Running, M.D.

## 2013-05-17 ENCOUNTER — Ambulatory Visit
Admission: RE | Admit: 2013-05-17 | Discharge: 2013-05-17 | Disposition: A | Discharge status: Home / Self Care | Payer: Medicaid Other | Source: Ambulatory Visit | Attending: Radiation Oncology | Admitting: Radiation Oncology

## 2013-05-17 DIAGNOSIS — G9389 Other specified disorders of brain: Secondary | ICD-10-CM

## 2013-05-17 DIAGNOSIS — Z85819 Personal history of malignant neoplasm of unspecified site of lip, oral cavity, and pharynx: Secondary | ICD-10-CM

## 2013-05-18 ENCOUNTER — Ambulatory Visit
Admission: RE | Admit: 2013-05-18 | Discharge: 2013-05-18 | Disposition: A | Discharge status: Home / Self Care | Payer: Medicaid Other | Source: Ambulatory Visit | Attending: Radiation Oncology | Admitting: Radiation Oncology

## 2013-05-18 ENCOUNTER — Other Ambulatory Visit: Payer: Self-pay | Admitting: *Deleted

## 2013-05-18 DIAGNOSIS — C7931 Secondary malignant neoplasm of brain: Secondary | ICD-10-CM

## 2013-05-19 ENCOUNTER — Ambulatory Visit
Admission: RE | Admit: 2013-05-19 | Discharge: 2013-05-19 | Disposition: A | Discharge status: Home / Self Care | Payer: Medicaid Other | Source: Ambulatory Visit | Attending: Radiation Oncology | Admitting: Radiation Oncology

## 2013-05-19 ENCOUNTER — Telehealth: Payer: Self-pay | Admitting: Internal Medicine

## 2013-05-19 NOTE — Telephone Encounter (Signed)
, °

## 2013-05-20 ENCOUNTER — Ambulatory Visit
Admission: RE | Admit: 2013-05-20 | Discharge: 2013-05-20 | Disposition: A | Discharge status: Home / Self Care | Payer: Medicaid Other | Source: Ambulatory Visit | Attending: Radiation Oncology | Admitting: Radiation Oncology

## 2013-05-21 ENCOUNTER — Ambulatory Visit
Admission: RE | Admit: 2013-05-21 | Discharge: 2013-05-21 | Disposition: A | Discharge status: Home / Self Care | Payer: Medicaid Other | Source: Ambulatory Visit | Attending: Radiation Oncology | Admitting: Radiation Oncology

## 2013-05-21 ENCOUNTER — Encounter: Payer: Self-pay | Admitting: Radiation Oncology

## 2013-05-21 VITALS — BP 139/84 | HR 80 | Resp 16 | Wt 171.9 lb

## 2013-05-21 DIAGNOSIS — C7931 Secondary malignant neoplasm of brain: Secondary | ICD-10-CM

## 2013-05-21 DIAGNOSIS — G9389 Other specified disorders of brain: Secondary | ICD-10-CM

## 2013-05-21 NOTE — Progress Notes (Signed)
  Radiation Oncology         [phone] ________________________________  Name: William Townsend MRN: [id number]  Date: 05/21/2013  DOB: [birth year]  Weekly Radiation Therapy Management  Current Dose: 12 Gy     Planned Dose:  30 Gy  Narrative . . . . . . . . The patient presents for routine under treatment assessment.                                                 The patient is without complaint.                                 Set-up films were reviewed.                                 The chart was checked. Physical Findings. . .  weight is 171 lb 14.4 oz (77.973 kg). His blood pressure is 139/84 and his pulse is 80. His respiration is 16. . Weight essentially stable.  No significant changes. Impression . . . . . . . The patient is tolerating radiation. Plan . . . . . . . . . . . . Continue treatment as planned.  ________________________________  [name]. Kathrynn Running, M.D.

## 2013-05-21 NOTE — Progress Notes (Signed)
Flat affect noted. Denies pain. Denies taking any medication at this time to include decadron or keppra. Denies headache, dizziness, nausea or vomiting.

## 2013-05-24 ENCOUNTER — Ambulatory Visit
Admission: RE | Admit: 2013-05-24 | Discharge: 2013-05-24 | Disposition: A | Discharge status: Home / Self Care | Payer: Medicaid Other | Source: Ambulatory Visit | Attending: Radiation Oncology | Admitting: Radiation Oncology

## 2013-05-25 ENCOUNTER — Ambulatory Visit
Admission: RE | Admit: 2013-05-25 | Discharge: 2013-05-25 | Disposition: A | Discharge status: Home / Self Care | Payer: Medicaid Other | Source: Ambulatory Visit | Attending: Radiation Oncology | Admitting: Radiation Oncology

## 2013-05-26 ENCOUNTER — Ambulatory Visit
Admission: RE | Admit: 2013-05-26 | Discharge: 2013-05-26 | Disposition: A | Discharge status: Home / Self Care | Payer: Medicaid Other | Source: Ambulatory Visit | Attending: Radiation Oncology | Admitting: Radiation Oncology

## 2013-05-27 ENCOUNTER — Ambulatory Visit
Admission: RE | Admit: 2013-05-27 | Discharge: 2013-05-27 | Disposition: A | Discharge status: Home / Self Care | Payer: Medicaid Other | Source: Ambulatory Visit | Attending: Radiation Oncology | Admitting: Radiation Oncology

## 2013-05-27 ENCOUNTER — Encounter: Payer: Self-pay | Admitting: Radiation Oncology

## 2013-05-27 VITALS — BP 162/96 | HR 90 | Temp 98.9°F | Ht 72.0 in | Wt 172.3 lb

## 2013-05-27 DIAGNOSIS — C7931 Secondary malignant neoplasm of brain: Secondary | ICD-10-CM

## 2013-05-27 NOTE — Progress Notes (Signed)
  Radiation Oncology         [phone] ________________________________  Name: William Townsend MRN: [id number]  Date: 05/27/2013  DOB: 07/19/1961  Weekly Radiation Therapy Management  Current Dose: 24 Gy     Planned Dose:  30 Gy  Narrative . . . . . . . . The patient presents for routine under treatment assessment.   He has had 8 fractions to his whole brain. He reports that he has had headaches and has been taking percocet for them. He has dizziness and balance issues. He does have blurred vision but says it is getting better. He reports nausea a few times in the morning for the last week. He has noticed that the skin on his head is itchy. He reports that the skin on his scalp is intact. He is not taking decadron at this time                                 Set-up films were reviewed.                                 The chart was checked. Physical Findings. . .  height is 6' (1.829 m) and weight is 172 lb 4.8 oz (78.155 kg). His temperature is 98.9 F (37.2 C). His blood pressure is 162/96 and his pulse is 90. His oxygen saturation is 99%. . Weight essentially stable.  No significant changes. Impression . . . . . . . The patient is tolerating radiation. Plan . . . . . . . . . . . . Continue treatment as planned.  ________________________________  [name]. Kathrynn Running, M.D.

## 2013-05-27 NOTE — Progress Notes (Addendum)
William Townsend here for weekly under treat visit.  He has had 8 fractions to his whole brain.  He reports that he has had headaches and has been taking percocet for them.  He has dizziness and balance issues.  He does have blurred vision but says it is getting better.  He reports nausea a few times in the morning for the last week.  He has noticed that the skin on his head is itchy.  He reports that the skin on his scalp is intact.  He is not taking decadron at this time.

## 2013-05-28 ENCOUNTER — Ambulatory Visit
Admission: RE | Admit: 2013-05-28 | Discharge: 2013-05-28 | Disposition: A | Discharge status: Home / Self Care | Payer: Medicaid Other | Source: Ambulatory Visit | Attending: Radiation Oncology | Admitting: Radiation Oncology

## 2013-05-31 ENCOUNTER — Ambulatory Visit: Payer: Medicaid Other

## 2013-06-01 ENCOUNTER — Encounter: Payer: Self-pay | Admitting: Radiation Oncology

## 2013-06-01 ENCOUNTER — Other Ambulatory Visit (HOSPITAL_BASED_OUTPATIENT_CLINIC_OR_DEPARTMENT_OTHER): Payer: Self-pay | Admitting: Lab

## 2013-06-01 ENCOUNTER — Telehealth: Payer: Self-pay | Admitting: Internal Medicine

## 2013-06-01 ENCOUNTER — Ambulatory Visit
Admission: RE | Admit: 2013-06-01 | Discharge: 2013-06-01 | Disposition: A | Discharge status: Home / Self Care | Payer: Medicaid Other | Source: Ambulatory Visit | Attending: Radiation Oncology | Admitting: Radiation Oncology

## 2013-06-01 ENCOUNTER — Encounter: Payer: Self-pay | Admitting: Internal Medicine

## 2013-06-01 ENCOUNTER — Telehealth: Payer: Self-pay | Admitting: *Deleted

## 2013-06-01 ENCOUNTER — Telehealth: Payer: Self-pay | Admitting: Radiation Oncology

## 2013-06-01 ENCOUNTER — Ambulatory Visit (HOSPITAL_BASED_OUTPATIENT_CLINIC_OR_DEPARTMENT_OTHER): Payer: Self-pay | Admitting: Internal Medicine

## 2013-06-01 VITALS — BP 156/91 | HR 78 | Temp 98.3°F | Resp 18 | Ht 72.0 in | Wt 171.0 lb

## 2013-06-01 DIAGNOSIS — C787 Secondary malignant neoplasm of liver and intrahepatic bile duct: Secondary | ICD-10-CM

## 2013-06-01 DIAGNOSIS — F17200 Nicotine dependence, unspecified, uncomplicated: Secondary | ICD-10-CM

## 2013-06-01 DIAGNOSIS — C3490 Malignant neoplasm of unspecified part of unspecified bronchus or lung: Secondary | ICD-10-CM

## 2013-06-01 DIAGNOSIS — R51 Headache: Secondary | ICD-10-CM

## 2013-06-01 DIAGNOSIS — C3491 Malignant neoplasm of unspecified part of right bronchus or lung: Secondary | ICD-10-CM

## 2013-06-01 DIAGNOSIS — K7689 Other specified diseases of liver: Secondary | ICD-10-CM

## 2013-06-01 DIAGNOSIS — Z85819 Personal history of malignant neoplasm of unspecified site of lip, oral cavity, and pharynx: Secondary | ICD-10-CM

## 2013-06-01 DIAGNOSIS — Z85118 Personal history of other malignant neoplasm of bronchus and lung: Secondary | ICD-10-CM

## 2013-06-01 DIAGNOSIS — C7931 Secondary malignant neoplasm of brain: Secondary | ICD-10-CM

## 2013-06-01 LAB — COMPREHENSIVE METABOLIC PANEL (CC13)
Albumin: 3.7 g/dL (ref 3.5–5.0)
BUN: 9.2 mg/dL (ref 7.0–26.0)
CO2: 26 mEq/L (ref 22–29)
Calcium: 9.7 mg/dL (ref 8.4–10.4)
Chloride: 101 mEq/L (ref 98–109)
Creatinine: 1.1 mg/dL (ref 0.7–1.3)
Glucose: 289 mg/dl — ABNORMAL HIGH (ref 70–140)
Potassium: 4.6 mEq/L (ref 3.5–5.1)

## 2013-06-01 LAB — CBC & DIFF AND RETIC
Basophils Absolute: 0 10*3/uL (ref 0.0–0.1)
EOS%: 0.2 % (ref 0.0–7.0)
Eosinophils Absolute: 0 10*3/uL (ref 0.0–0.5)
HGB: 16.5 g/dL (ref 13.0–17.1)
LYMPH%: 13.3 % — ABNORMAL LOW (ref 14.0–49.0)
MCH: 33.2 pg (ref 27.2–33.4)
MCV: 92 fL (ref 79.3–98.0)
MONO%: 11.6 % (ref 0.0–14.0)
NEUT#: 4.8 10*3/uL (ref 1.5–6.5)
Platelets: 133 10*3/uL — ABNORMAL LOW (ref 140–400)
RDW: 13.9 % (ref 11.0–14.6)
Retic Ct Abs: 113.81 10*3/uL — ABNORMAL HIGH (ref 34.80–93.90)

## 2013-06-01 MED ORDER — PROCHLORPERAZINE MALEATE 10 MG PO TABS: 10.0000 mg | ORAL_TABLET | Freq: Four times a day (QID) | ORAL | Status: DC | PRN

## 2013-06-01 NOTE — Patient Instructions (Signed)
We discussed treatment options including systemic chemotherapy with carboplatin and etoposide. First cycle next week.

## 2013-06-01 NOTE — Telephone Encounter (Signed)
gv and printed appt sched and avs forpt for Sept and OCT....gv pt smoking cessation # [phone] for [employer]  ...emailed MW to add tx

## 2013-06-01 NOTE — Progress Notes (Signed)
Carboplatin and etoposide are not replaceable drugs.

## 2013-06-01 NOTE — Progress Notes (Signed)
[facility] [phone]   [phone]  CONSULT NOTE  REFERRING PHYSICIAN: Dr. Margaretmary Dys  REASON FOR CONSULTATION:  52 years old white male recently diagnosed with lung cancer  HPI William Townsend is a 52 y.o. male with a past medical history significant for atrial fibrillation, diabetes mellitus, alcoholic liver cirrhosis, history of substance abuse, depression, stroke in 2012 as well as history of cancer of the tonsil. The patient mentions that he was diagnosed with cancer of the tonsil 10 2008 and he underwent a course of concurrent chemoradiation completed in February of 2009. In 2010 he was found to have right upper lobe lung lesion and he underwent right upper lobectomy followed by adjuvant chemotherapy in [place], [place] under the care of Dr. Dewitt Hoes. The patient lost his job in [place] and he did not have insurance. He was lost to followup with his primary oncologist. He moved to [place] with some friends. He did not establish care with a medical oncologist in [place]. The patient has been doing fine with no specific complaints except for a stroke in 2012. In August of 2014 the patient complained of having a right-sided visual changes with persistent headache of 2 months duration. He presented to the emergency Department at [facility] on 04/21/2013. CT scan of the head performed that day showed a mass-like area with increased attenuation, probably containing hemorrhage, arising in the left occipital lobe measuring 5.1 x 2.9 cm. There is surrounding vasogenic edema. There is a 5 mm focus of increased attenuation at the level of the third ventricle. This finding may indicate a small focus of  Hemorrhage. CT scan of the chest with contrast on the same day showed nodular consolidation with some central air  bronchogram in the left upper lobe measures at least 3.3 x 3 cm. This is highly suspicious for primary or  secondary malignancy.  Further evaluation with bronchoscopy and/or biopsy is recommended. Postsurgical changes are noted in the right upper lobe.  Spiculated scarring in the right upper lobe measures 2 x 1.8 cm. No adenopathy. There are low density lesions within liver the largest in right hepatic lobe centrally measures 7.4 cm. Low density lesion with anterior aspect of the spleen measures 4 cm. This are highly  suspicious for metastatic disease.  MRI of the brain on 04/23/2013 showed 46 x 32 x 37 mm left occipital extra-axial mass with associated edema but no midline shift. 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 The patient was seen by Dr. Jeral Fruit and on 04/26/2013 he underwent Left occipital craniotomy, resection of the tumor with the  microscope and Cavitron. The final pathology (Accession: [id number]) showed poorly differentiated malignancy consistent with poorly differentiated metastatic carcinoma. The tumor is characterized by nests and sheets of cells with marked nuclear atypia, nuclear moulding and high mitotic rate and areas of tumor necrosis. Immunohistochemistry is performed and the tumor is positive with Cytokeratin AE1 / AE3, epithelial membrane antigen and CD56. The tumor is negative with glial fibrillary acidic protein (GFAP), S-100, thyroid transcription factor - 1 (TTF-1), chromogranin and synaptophysin. The morphologic and the immunophenotypic findings are consistent with poorly differentiated metastatic carcinoma. The morphologic features raise the possibility of a poorly differentiated small cell carcinoma and the CD56 positivity can be seen with small cell carcinoma. The patient was referred to Dr. Kathrynn Running and he underwent brain irradiation completed today 06/01/2013. He was referred to me  today for evaluation and discussion of his systemic treatment options. The patient continues to complain of  mild headache and visual changes especially when he tries to focus on an object. He currently takes Percocet for his headache. He has some seasonal allergy with increased mucus production. He denied having any significant chest pain, shortness breath or hemoptysis. He denied having any significant weight loss or night sweats. The patient has no nausea or vomiting, no fever or chills.  Family history significant for mother with diabetes mellitus and father with heart disease and stroke. The patient is single and has 2 children a son who is 87 years old and daughter 27 years old. He used to work in [employer]. He has a history of smoking 1-2 packs per day for over 30 years and unfortunately continues to smoke around half a pack per day. He also has a history of alcohol abuse and still drinks a few alcoholic drinks every day. He has a history of drug abuse but quit in 2010.    @SFHPI @  Past Medical History  Diagnosis Date  . Atrial fibrillation   . Diabetes mellitus   . 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 Allergy   . Substance abuse   . Depression   . Stroke     3 previous strokes - Oct, Nov, and Dec 2012  . Throat cancer     September 2008  . Lung cancer     lobectomy in 2010  . Heart murmur     Past Surgical History  Procedure Laterality Date  . Lobectomy    . Craniotomy Left 04/27/2013    Procedure: Left Occipital Craniotomy for resection of tumor with microscope;  Surgeon: Karn Cassis, MD;  Location: [facility];  Service: Neurosurgery;  Laterality: Left;    Family History  Problem Relation Age of Onset  . Diabetes Mother   . Early death Mother   . Heart disease Father   . Stroke Father   . Asthma Sister   . Depression Sister     Social History History  Substance Use Topics  . Smoking status: Current Every Day Smoker -- 0.50 packs/day for 30 years    Types: Cigarettes  . Smokeless tobacco: Never Used  . Alcohol Use: 0.0 oz/week    1-2 Cans of beer per week      Comment: couple beers at night    Allergies  Allergen Reactions  . Vicodin [Hydrocodone-Acetaminophen] Itching    Current Outpatient Prescriptions  Medication Sig Dispense Refill  . oxyCODONE-acetaminophen (PERCOCET/ROXICET) 5-325 MG per tablet Take 1-2 tablets by mouth every 6 (six) hours as needed.  90 tablet  0   No current facility-administered medications for this visit.    Review of Systems  Constitutional: positive for fatigue Eyes: positive for visual disturbance Ears, nose, mouth, throat, and face: negative Respiratory: positive for cough and sputum Cardiovascular: negative Gastrointestinal: negative Genitourinary:negative Integument/breast: negative Hematologic/lymphatic: negative Musculoskeletal:negative Neurological: positive for headaches Behavioral/Psych: negative Endocrine: negative Allergic/Immunologic: negative  Physical Exam  JYN:WGNFA, healthy, no distress, well nourished and well developed SKIN: skin color, texture, turgor are normal, no rashes or significant lesions HEAD: Normocephalic, No masses, lesions, tenderness or abnormalities EYES: normal, PERRLA EARS: External ears normal, Canals clear OROPHARYNX:no exudate, no erythema and lips, buccal mucosa, and tongue normal  NECK: supple, no adenopathy, no JVD, thyroid normal size, non-tender, without nodularity LYMPH:  no palpable lymphadenopathy, no hepatosplenomegaly LUNGS: clear to auscultation , and palpation, clear to auscultation and  percussion HEART: regular rate & rhythm, no murmurs and no gallops ABDOMEN:abdomen soft, non-tender, normal bowel sounds and no masses or organomegaly BACK: Back symmetric, no curvature., No CVA tenderness EXTREMITIES:no joint deformities, effusion, or inflammation, no edema, no skin discoloration, no clubbing  NEURO: alert & oriented x 3 with fluent speech, no focal motor/sensory deficits  PERFORMANCE STATUS: ECOG 1  LABORATORY DATA: Lab Results  Component  Value Date   WBC 6.4 06/01/2013   HGB 16.5 06/01/2013   HCT 45.7 06/01/2013   MCV 92.0 06/01/2013   PLT 133* 06/01/2013      Chemistry      Component Value Date/Time   NA 138 06/01/2013 1115   NA 132* 04/29/2013 0930   K 4.6 06/01/2013 1115   K 4.8 04/29/2013 0930   CL 94* 04/29/2013 0930   CO2 26 06/01/2013 1115   CO2 26 04/29/2013 0930   BUN 9.2 06/01/2013 1115   BUN 26* 04/29/2013 0930   CREATININE 1.1 06/01/2013 1115   CREATININE 0.97 04/29/2013 0930   CREATININE 1.04 01/28/2012 1454      Component Value Date/Time   CALCIUM 9.7 06/01/2013 1115   CALCIUM 9.5 04/29/2013 0930   ALKPHOS 123 06/01/2013 1115   ALKPHOS 122* 04/21/2013 1041   AST 22 06/01/2013 1115   AST 29 04/21/2013 1041   ALT 12 06/01/2013 1115   ALT 28 04/21/2013 1041   BILITOT 0.96 06/01/2013 1115   BILITOT 0.7 04/21/2013 1041       RADIOGRAPHIC STUDIES: No results found.  ASSESSMENT: This is a very pleasant 52 years old white male recently diagnosed with extensive stage small cell lung cancer with brain metastases as well as liver metastases. He is status post resection of the left occipital tumor followed by whole brain irradiation. The patient also has a history of tonsillar cancer as well as right upper lobe lung cancer status post resection followed by chemotherapy completed in 2010.  PLAN: I have a lengthy discussion with the patient today about his current disease stage, prognosis and treatment options. I recommended for the patient to complete his staging workup by ordering CT scan of the abdomen and pelvis to rule out any other metastatic disease. I have a lengthy discussion with the patient today about his treatment options. I gave the patient the option of palliative systemic chemotherapy versus palliative care and hospice referral. The patient is interested in proceeding with chemotherapy. I recommended for him regimen consisting of carboplatin for AUC of 5 given on day 1 and etoposide 120 mg/M2 on days 1, 2 and 3  with Neulasta support on day 4. I discussed with the patient adverse effect of the chemotherapy including but not limited to alopecia, myelosuppression, nausea and vomiting, peripheral neuropathy, liver or renal dysfunction. I will arrange for the patient to have achy medication class before starting the first cycle of his chemotherapy next week. I will call his pharmacy with prescription for Compazine 10 mg by mouth every 6 hours as needed for nausea. For headaches, he will continue on Percocet on an as-needed basis. I gave the patient a time to ask questions and I answered them completely to his satisfaction. The patient voices understanding of current disease status and treatment options and is in agreement with the current care plan.  All questions were answered. The patient knows to call the clinic with any problems, questions or concerns. We can certainly see the patient much sooner if necessary.  Thank you so much for allowing me to  participate in the care of William Townsend. I will continue to follow up the patient with you and assist in his care.  I spent 55 minutes counseling the patient face to face. The total time spent in the appointment was 80 minutes.  Jacey Pelc K. 06/01/2013, 12:54 PM

## 2013-06-01 NOTE — Telephone Encounter (Signed)
Per staff message and POF I have scheduled appts.  JMW  

## 2013-06-01 NOTE — Telephone Encounter (Signed)
Patient did not show for final treatment yesterday. Phoned patient to assess status this morning. Patient verbalized, "I felt really bad yesterday and just couldn't make it." Patient reports, "I am coming in for a follow up with [facility] at 1045...could you work me in?" Per Brewer, RT on [place] 1 instructed patient to arrive at 1020 to be worked in for final radiation treatment prior to follow up with [facility]. Patient verbalized understanding.

## 2013-06-02 ENCOUNTER — Encounter: Payer: Self-pay | Admitting: *Deleted

## 2013-06-02 ENCOUNTER — Other Ambulatory Visit: Payer: Self-pay | Admitting: *Deleted

## 2013-06-02 ENCOUNTER — Telehealth: Payer: Self-pay | Admitting: *Deleted

## 2013-06-02 ENCOUNTER — Other Ambulatory Visit: Payer: Medicaid Other

## 2013-06-02 ENCOUNTER — Ambulatory Visit (HOSPITAL_COMMUNITY)
Admission: RE | Admit: 2013-06-02 | Discharge: 2013-06-02 | Disposition: A | Discharge status: Home / Self Care | Payer: Medicaid Other | Source: Ambulatory Visit | Attending: Internal Medicine | Admitting: Internal Medicine

## 2013-06-02 DIAGNOSIS — C3490 Malignant neoplasm of unspecified part of unspecified bronchus or lung: Secondary | ICD-10-CM | POA: Insufficient documentation

## 2013-06-02 DIAGNOSIS — Z9221 Personal history of antineoplastic chemotherapy: Secondary | ICD-10-CM | POA: Insufficient documentation

## 2013-06-02 DIAGNOSIS — Z85819 Personal history of malignant neoplasm of unspecified site of lip, oral cavity, and pharynx: Secondary | ICD-10-CM | POA: Insufficient documentation

## 2013-06-02 DIAGNOSIS — C3491 Malignant neoplasm of unspecified part of right bronchus or lung: Secondary | ICD-10-CM

## 2013-06-02 DIAGNOSIS — D739 Disease of spleen, unspecified: Secondary | ICD-10-CM | POA: Insufficient documentation

## 2013-06-02 DIAGNOSIS — I7090 Unspecified atherosclerosis: Secondary | ICD-10-CM | POA: Insufficient documentation

## 2013-06-02 DIAGNOSIS — K439 Ventral hernia without obstruction or gangrene: Secondary | ICD-10-CM | POA: Insufficient documentation

## 2013-06-02 DIAGNOSIS — Z923 Personal history of irradiation: Secondary | ICD-10-CM | POA: Insufficient documentation

## 2013-06-02 DIAGNOSIS — K7689 Other specified diseases of liver: Secondary | ICD-10-CM | POA: Insufficient documentation

## 2013-06-02 DIAGNOSIS — M899 Disorder of bone, unspecified: Secondary | ICD-10-CM | POA: Insufficient documentation

## 2013-06-02 DIAGNOSIS — K829 Disease of gallbladder, unspecified: Secondary | ICD-10-CM | POA: Insufficient documentation

## 2013-06-02 MED ORDER — IOHEXOL 300 MG/ML  SOLN
100.0000 mL | Freq: Once | INTRAMUSCULAR | Status: AC | PRN
  Administered 2013-06-02: 100 mL via INTRAVENOUS

## 2013-06-02 MED ORDER — LIDOCAINE-PRILOCAINE 2.5-2.5 % EX CREA: TOPICAL_CREAM | CUTANEOUS | Status: DC | PRN

## 2013-06-02 MED ORDER — IOHEXOL 300 MG/ML  SOLN
50.0000 mL | Freq: Once | INTRAMUSCULAR | Status: AC | PRN
  Administered 2013-06-02: 50 mL via ORAL

## 2013-06-02 NOTE — Telephone Encounter (Signed)
Spoke to Dr Fredirick Lathe in radiology.  She states that he DOES in fact have a central line and that "x-ray does not lie".  Pt is denying having any kind of central line.  Dr Fredirick Lathe recommends a repeat CXR to confirm findings.  Will order CXR to be done tomorrow before port placement.  Tina in IR is aware.  Called pt, pt is aware as well.  SLJ

## 2013-06-02 NOTE — Addendum Note (Signed)
Addended by: Caren Griffins on: 06/02/2013 03:15 PM   Modules accepted: Orders

## 2013-06-02 NOTE — Telephone Encounter (Signed)
CXR shows that pt has a central line and that the tip is near the brachocephallic vein.  Pt states he does not have a port or central line and has never had one.  Pt's chest palpated and assessed.  No signs of any central line present.  Called radiology to inform them of this.  An addendum will be made to the report.

## 2013-06-02 NOTE — Telephone Encounter (Signed)
Pt is requesting port placement.  Per Dr Donnald Garre, okay to have arranged with IR.  SLJ

## 2013-06-03 ENCOUNTER — Other Ambulatory Visit: Payer: Self-pay | Admitting: Radiology

## 2013-06-03 ENCOUNTER — Ambulatory Visit (HOSPITAL_COMMUNITY)
Admission: RE | Admit: 2013-06-03 | Discharge: 2013-06-03 | Disposition: A | Discharge status: Home / Self Care | Payer: Medicaid Other | Source: Ambulatory Visit | Attending: Internal Medicine | Admitting: Internal Medicine

## 2013-06-03 ENCOUNTER — Encounter (HOSPITAL_COMMUNITY): Payer: Self-pay

## 2013-06-03 ENCOUNTER — Other Ambulatory Visit: Payer: Self-pay | Admitting: Internal Medicine

## 2013-06-03 DIAGNOSIS — Z8673 Personal history of transient ischemic attack (TIA), and cerebral infarction without residual deficits: Secondary | ICD-10-CM | POA: Insufficient documentation

## 2013-06-03 DIAGNOSIS — E119 Type 2 diabetes mellitus without complications: Secondary | ICD-10-CM | POA: Insufficient documentation

## 2013-06-03 DIAGNOSIS — F17200 Nicotine dependence, unspecified, uncomplicated: Secondary | ICD-10-CM | POA: Insufficient documentation

## 2013-06-03 DIAGNOSIS — Z85819 Personal history of malignant neoplasm of unspecified site of lip, oral cavity, and pharynx: Secondary | ICD-10-CM | POA: Insufficient documentation

## 2013-06-03 DIAGNOSIS — C7931 Secondary malignant neoplasm of brain: Secondary | ICD-10-CM | POA: Insufficient documentation

## 2013-06-03 DIAGNOSIS — C3490 Malignant neoplasm of unspecified part of unspecified bronchus or lung: Secondary | ICD-10-CM

## 2013-06-03 DIAGNOSIS — K709 Alcoholic liver disease, unspecified: Secondary | ICD-10-CM | POA: Insufficient documentation

## 2013-06-03 LAB — CBC WITH DIFFERENTIAL/PLATELET
Basophils Relative: 0 % (ref 0–1)
Eosinophils Absolute: 0 10*3/uL (ref 0.0–0.7)
Hemoglobin: 15.3 g/dL (ref 13.0–17.0)
Lymphs Abs: 1.1 10*3/uL (ref 0.7–4.0)
MCH: 33.8 pg (ref 26.0–34.0)
Neutro Abs: 3.6 10*3/uL (ref 1.7–7.7)
Neutrophils Relative %: 68 % (ref 43–77)
Platelets: 125 10*3/uL — ABNORMAL LOW (ref 150–400)
RBC: 4.52 MIL/uL (ref 4.22–5.81)

## 2013-06-03 LAB — BASIC METABOLIC PANEL
CO2: 25 mEq/L (ref 19–32)
Chloride: 98 mEq/L (ref 96–112)
Creatinine, Ser: 0.91 mg/dL (ref 0.50–1.35)
GFR calc Af Amer: >90 mL/min (ref >90)
Potassium: 3.8 mEq/L (ref 3.5–5.1)
Sodium: 136 mEq/L (ref 135–145)

## 2013-06-03 LAB — PROTIME-INR
INR: 1.04 (ref 0.00–1.49)
Prothrombin Time: 13.4 seconds (ref 11.6–15.2)

## 2013-06-03 MED ORDER — LIDOCAINE-EPINEPHRINE (PF) 2 %-1:200000 IJ SOLN
INTRAMUSCULAR | Status: AC
  Filled 2013-06-03: qty 20

## 2013-06-03 MED ORDER — MIDAZOLAM HCL 2 MG/2ML IJ SOLN
INTRAMUSCULAR | Status: AC | PRN
  Administered 2013-06-03 (×2): 1 mg via INTRAVENOUS

## 2013-06-03 MED ORDER — FENTANYL CITRATE 0.05 MG/ML IJ SOLN
INTRAMUSCULAR | Status: AC | PRN
  Administered 2013-06-03: 50 ug via INTRAVENOUS
  Administered 2013-06-03: 100 ug via INTRAVENOUS

## 2013-06-03 MED ORDER — LIDOCAINE HCL 1 % IJ SOLN
INTRAMUSCULAR | Status: AC
  Filled 2013-06-03: qty 20

## 2013-06-03 MED ORDER — SODIUM CHLORIDE 0.9 % IV SOLN
INTRAVENOUS | Status: DC
  Administered 2013-06-03: 13:00:00 via INTRAVENOUS

## 2013-06-03 MED ORDER — CEFAZOLIN SODIUM-DEXTROSE 2-3 GM-% IV SOLR: 2.0000 g | Freq: Once | INTRAVENOUS | Status: DC

## 2013-06-03 MED ORDER — CEFAZOLIN SODIUM-DEXTROSE 2-3 GM-% IV SOLR
INTRAVENOUS | Status: AC
  Filled 2013-06-03: qty 50

## 2013-06-03 MED ORDER — FENTANYL CITRATE 0.05 MG/ML IJ SOLN
INTRAMUSCULAR | Status: AC
  Filled 2013-06-03: qty 6

## 2013-06-03 MED ORDER — HEPARIN SOD (PORK) LOCK FLUSH 100 UNIT/ML IV SOLN
INTRAVENOUS | Status: AC | PRN
  Administered 2013-06-03: 500 [IU]

## 2013-06-03 MED ORDER — MIDAZOLAM HCL 2 MG/2ML IJ SOLN
INTRAMUSCULAR | Status: AC
  Filled 2013-06-03: qty 6

## 2013-06-03 NOTE — H&P (Signed)
Chief Complaint: "I'm here for a port" Referring Physician:Mohamed HPI: William Townsend is an 52 y.o. male with lung cancer with mets to the brain and liver. He is to undergo chemotherapy and is referred for a port. He is scheduled for placement today. He feels well otherwise, no recent fevers, illness. Recovering well from open resection of the left occipital tumor by Dr. Jeral Fruit. PMHx and meds reviewed.   Past Medical History:  Past Medical History  Diagnosis Date  . Atrial fibrillation   . Diabetes mellitus   . 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 Allergy   . Substance abuse   . Depression   . Stroke     3 previous strokes - Oct, Nov, and Dec 2012  . Throat cancer     September 2008  . Lung cancer     lobectomy in 2010  . Heart murmur     Past Surgical History:  Past Surgical History  Procedure Laterality Date  . Lobectomy    . Craniotomy Left 04/27/2013    Procedure: Left Occipital Craniotomy for resection of tumor with microscope;  Surgeon: Karn Cassis, MD;  Location: [facility];  Service: Neurosurgery;  Laterality: Left;    Family History:  Family History  Problem Relation Age of Onset  . Diabetes Mother   . Early death Mother   . Heart disease Father   . Stroke Father   . Asthma Sister   . Depression Sister     Social History:  reports that he has been smoking Cigarettes.  He has a 15 pack-year smoking history. He has never used smokeless tobacco. He reports that  drinks alcohol. He reports that he uses illicit drugs (Marijuana).  Allergies:  Allergies  Allergen Reactions  . Vicodin [Hydrocodone-Acetaminophen] Itching    Medications:   Medication List    ASK your doctor about these medications       lidocaine-prilocaine cream  Commonly known as:  EMLA  Apply topically as needed. Apply to port 1 hour before chemotherapy.     oxyCODONE-acetaminophen 5-325 MG per tablet  Commonly known as:  PERCOCET/ROXICET  Take 1-2 tablets by mouth every 6 (six)  hours as needed.     prochlorperazine 10 MG tablet  Commonly known as:  COMPAZINE  Take 1 tablet (10 mg total) by mouth every 6 (six) hours as needed.        Please HPI for pertinent positives, otherwise complete 10 system ROS negative.  Physical Exam: BP 140/89  Temp(Src) 98.2 F (36.8 C) (Oral)  Resp 20  Ht 6' (1.829 m)  Wt 172 lb (78.019 kg)  BMI 23.32 kg/m2  SpO2 99% Body mass index is 23.32 kg/(m^2).   General Appearance:  Alert, cooperative, no distress, appears stated age  Head:  Normocephalic, without obvious abnormality, atraumatic  ENT: Unremarkable  Neck: Supple, symmetrical, trachea midline  Lungs:   Clear to auscultation bilaterally, no w/r/r, respirations unlabored without use of accessory muscles.  Chest Wall:  No tenderness or deformity  Heart:  Regular rate and rhythm, S1, S2 normal, no murmur, rub or gallop.  Abdomen:   Soft, non-tender, non distended.  Neurologic: Normal affect, no gross deficits.   Results for orders placed during the hospital encounter of 06/03/13 (from the past 48 hour(s))  BASIC METABOLIC PANEL     Status: Abnormal   Collection Time    06/03/13  1:10 PM      Result Value Range   Sodium 136  135 - 145 mEq/L   Potassium 3.8  3.5 - 5.1 mEq/L   Chloride 98  96 - 112 mEq/L   CO2 25  19 - 32 mEq/L   Glucose, Bld 166 (*) 70 - 99 mg/dL   BUN 12  6 - 23 mg/dL   Creatinine, Ser 9.60  0.50 - 1.35 mg/dL   Calcium 9.0  8.4 - 45.4 mg/dL   GFR calc non Af Amer >90  >90 mL/min   GFR calc Af Amer >90  >90 mL/min   Comment: (NOTE)     The eGFR has been calculated using the CKD EPI equation.     This calculation has not been validated in all clinical situations.     eGFR's persistently <90 mL/min signify possible Chronic Kidney     Disease.  PROTIME-INR     Status: None   Collection Time    06/03/13  1:10 PM      Result Value Range   Prothrombin Time 13.4  11.6 - 15.2 seconds   INR 1.04  0.00 - 1.49  CBC WITH DIFFERENTIAL     Status:  Abnormal   Collection Time    06/03/13  1:40 PM      Result Value Range   WBC 5.3  4.0 - 10.5 K/uL   RBC 4.52  4.22 - 5.81 MIL/uL   Hemoglobin 15.3  13.0 - 17.0 g/dL   HCT 09.8  11.9 - 14.7 %   MCV 91.8  78.0 - 100.0 fL   MCH 33.8  26.0 - 34.0 pg   MCHC 36.9 (*) 30.0 - 36.0 g/dL   RDW 82.9  56.2 - 13.0 %   Platelets 125 (*) 150 - 400 K/uL   Neutrophils Relative % 68  43 - 77 %   Neutro Abs 3.6  1.7 - 7.7 K/uL   Lymphocytes Relative 21  12 - 46 %   Lymphs Abs 1.1  0.7 - 4.0 K/uL   Monocytes Relative 10  3 - 12 %   Monocytes Absolute 0.6  0.1 - 1.0 K/uL   Eosinophils Relative 1  0 - 5 %   Eosinophils Absolute 0.0  0.0 - 0.7 K/uL   Basophils Relative 0  0 - 1 %   Basophils Absolute 0.0  0.0 - 0.1 K/uL   Ct Abdomen Pelvis W Contrast  06/02/2013   *RADIOLOGY REPORT*  Clinical Data: Tonsillar carcinoma with lung metastases.  History of lung cancer.  Previous chemotherapy and radiation therapy.  CT ABDOMEN AND PELVIS WITH CONTRAST  Technique:  Multidetector CT imaging of the abdomen and pelvis was performed following the standard protocol during bolus administration of intravenous contrast.  Contrast: OMNIPAQUE IOHEXOL 300 MG/ML  SOLN  Comparison: CT chest 04/21/2013.  Findings: Lung bases show no acute findings.  Heart size normal. No pericardial or pleural effusion.  There are several heterogeneous lesions in the liver.  Index mass in the inferior right hepatic lobe measures 5.4 x 5.8 cm.  A 1.6 cm noncalcified lesion in the gallbladder is presumably a stone.  Adrenal glands and kidneys are unremarkable.  A heterogeneous lesion in the spleen measures 3.3 x 4.6 cm, likely stable.  Pancreas, stomach and bowel are unremarkable.  No pathologically enlarged lymph nodes.  Atherosclerotic calcification of the arterial vasculature without abdominal aortic aneurysm.  Scattered lymph nodes are sub centimeter in short axis size.  Tiny right paramidline ventral hernia contains fat.  A single lucent  lesion in the left iliac wing  measures 8 mm and is nonspecific.  Degenerative changes are seen in the spine.  IMPRESSION:  1.  Hepatic lesions are highly worrisome for metastatic disease. 2.  Splenic lesion is indeterminate.  Metastatic disease is within the differential diagnosis. 3.  Noncalcified lesion in the gallbladder is presumably a gallstone. 4.  Small well-circumscribed lucent lesion in the left iliac wing is nonspecific.  Attention on follow-up exams is warranted.   Original Report Authenticated By: Leanna Battles, M.D.    Assessment/Plan Metastatic lung cancer Discussed port placement. Explained procedure, risks, complications, use of sedation. Labs reviewed. Consent signed in chart  Brayton El PA-C 06/03/2013, 2:42 PM

## 2013-06-03 NOTE — H&P (Signed)
Agree with PA note.  Signed,  Wendi Lastra K. Herbert Marken, MD [facility]  

## 2013-06-03 NOTE — Procedures (Signed)
Interventional Radiology Procedure Note  Procedure: Placement of a right IJ approach single lumen PowerPort.  Tip is positioned at the superior cavoatrial junction and catheter is ready for immediate use.  Complications: No immediate Recommendations:  - Ok to shower tomorrow - Do not submerge for 7 days - Routine line care   Signed,  Heath K. McCullough, MD [facility]  

## 2013-06-07 ENCOUNTER — Ambulatory Visit (HOSPITAL_BASED_OUTPATIENT_CLINIC_OR_DEPARTMENT_OTHER): Payer: Medicaid Other

## 2013-06-07 ENCOUNTER — Other Ambulatory Visit (HOSPITAL_BASED_OUTPATIENT_CLINIC_OR_DEPARTMENT_OTHER): Payer: Medicaid Other

## 2013-06-07 VITALS — BP 133/82 | HR 73 | Temp 97.5°F | Resp 20

## 2013-06-07 DIAGNOSIS — C787 Secondary malignant neoplasm of liver and intrahepatic bile duct: Secondary | ICD-10-CM

## 2013-06-07 DIAGNOSIS — C3490 Malignant neoplasm of unspecified part of unspecified bronchus or lung: Secondary | ICD-10-CM

## 2013-06-07 DIAGNOSIS — C3491 Malignant neoplasm of unspecified part of right bronchus or lung: Secondary | ICD-10-CM

## 2013-06-07 DIAGNOSIS — Z5111 Encounter for antineoplastic chemotherapy: Secondary | ICD-10-CM

## 2013-06-07 DIAGNOSIS — C7931 Secondary malignant neoplasm of brain: Secondary | ICD-10-CM

## 2013-06-07 LAB — CBC WITH DIFFERENTIAL/PLATELET
Basophils Absolute: 0 10*3/uL (ref 0.0–0.1)
Eosinophils Absolute: 0 10*3/uL (ref 0.0–0.5)
HCT: 43.4 % (ref 38.4–49.9)
HGB: 16.2 g/dL (ref 13.0–17.1)
LYMPH%: 15.8 % (ref 14.0–49.0)
MCHC: 37.3 g/dL — ABNORMAL HIGH (ref 32.0–36.0)
MCV: 91.9 fL (ref 79.3–98.0)
MONO#: 0.6 10*3/uL (ref 0.1–0.9)
NEUT#: 4.5 10*3/uL (ref 1.5–6.5)
NEUT%: 74.2 % (ref 39.0–75.0)
Platelets: 115 10*3/uL — ABNORMAL LOW (ref 140–400)
WBC: 6 10*3/uL (ref 4.0–10.3)

## 2013-06-07 LAB — COMPREHENSIVE METABOLIC PANEL (CC13)
Albumin: 3.4 g/dL — ABNORMAL LOW (ref 3.5–5.0)
Alkaline Phosphatase: 118 U/L (ref 40–150)
BUN: 9.8 mg/dL (ref 7.0–26.0)
CO2: 25 mEq/L (ref 22–29)
Calcium: 9.1 mg/dL (ref 8.4–10.4)
Chloride: 100 mEq/L (ref 98–109)
Creatinine: 1.1 mg/dL (ref 0.7–1.3)
Glucose: 359 mg/dl — ABNORMAL HIGH (ref 70–140)
Potassium: 3.7 mEq/L (ref 3.5–5.1)
Total Protein: 6.5 g/dL (ref 6.4–8.3)

## 2013-06-07 LAB — TECHNOLOGIST REVIEW

## 2013-06-07 MED ORDER — SODIUM CHLORIDE 0.9 % IV SOLN
Freq: Once | INTRAVENOUS | Status: AC
  Administered 2013-06-07: 14:00:00 via INTRAVENOUS

## 2013-06-07 MED ORDER — DEXAMETHASONE SODIUM PHOSPHATE 10 MG/ML IJ SOLN
INTRAMUSCULAR | Status: AC
  Filled 2013-06-07: qty 1

## 2013-06-07 MED ORDER — HEPARIN SOD (PORK) LOCK FLUSH 100 UNIT/ML IV SOLN
500.0000 [IU] | Freq: Once | INTRAVENOUS | Status: AC | PRN
  Administered 2013-06-07: 500 [IU]
  Filled 2013-06-07: qty 5

## 2013-06-07 MED ORDER — DEXAMETHASONE SODIUM PHOSPHATE 20 MG/5ML IJ SOLN
20.0000 mg | Freq: Once | INTRAMUSCULAR | Status: AC
  Administered 2013-06-07: 20 mg via INTRAVENOUS

## 2013-06-07 MED ORDER — SODIUM CHLORIDE 0.9 % IV SOLN
120.0000 mg/m2 | Freq: Once | INTRAVENOUS | Status: AC
  Administered 2013-06-07: 240 mg via INTRAVENOUS
  Filled 2013-06-07: qty 12

## 2013-06-07 MED ORDER — SODIUM CHLORIDE 0.9 % IV SOLN
561.0000 mg | Freq: Once | INTRAVENOUS | Status: AC
  Administered 2013-06-07: 560 mg via INTRAVENOUS
  Filled 2013-06-07: qty 56

## 2013-06-07 MED ORDER — ONDANSETRON 16 MG/50ML IVPB (CHCC)
16.0000 mg | Freq: Once | INTRAVENOUS | Status: AC
  Administered 2013-06-07: 16 mg via INTRAVENOUS

## 2013-06-07 MED ORDER — SODIUM CHLORIDE 0.9 % IJ SOLN
10.0000 mL | INTRAMUSCULAR | Status: DC | PRN
  Administered 2013-06-07: 10 mL
  Filled 2013-06-07: qty 10

## 2013-06-07 MED ORDER — ONDANSETRON 8 MG/NS 50 ML IVPB
INTRAVENOUS | Status: AC
  Filled 2013-06-07: qty 8

## 2013-06-07 MED ORDER — DEXAMETHASONE SODIUM PHOSPHATE 20 MG/5ML IJ SOLN
INTRAMUSCULAR | Status: AC
  Filled 2013-06-07: qty 5

## 2013-06-07 NOTE — Patient Instructions (Addendum)
[facility] Discharge Instructions for Patients Receiving Chemotherapy  Today you received the following chemotherapy agents carboplatin and VP-16  To help prevent nausea and vomiting after your treatment, we encourage you to take your nausea medication compazine.   If you develop nausea and vomiting that is not controlled by your nausea medication, call the clinic.   BELOW ARE SYMPTOMS THAT SHOULD BE REPORTED IMMEDIATELY:  *FEVER GREATER THAN 100.5 F  *CHILLS WITH OR WITHOUT FEVER  NAUSEA AND VOMITING THAT IS NOT CONTROLLED WITH YOUR NAUSEA MEDICATION  *UNUSUAL SHORTNESS OF BREATH  *UNUSUAL BRUISING OR BLEEDING  TENDERNESS IN MOUTH AND THROAT WITH OR WITHOUT PRESENCE OF ULCERS  *URINARY PROBLEMS  *BOWEL PROBLEMS  UNUSUAL RASH Items with * indicate a potential emergency and should be followed up as soon as possible.  Feel free to call the clinic you have any questions or concerns. The clinic phone number is [phone].    

## 2013-06-07 NOTE — Progress Notes (Signed)
Patient discharged.  He is not driving.  A friend is picking him up.  All questions have been answered before discharge.Marland Kitchen

## 2013-06-07 NOTE — Progress Notes (Signed)
  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  Indication for treatment:  Palliation of brain metastases       Radiation treatment dates:   05/18/2013-06/01/2013  Site/dose:   The whole brain was treated to a total dose of 30 Gy in 10 fractions.  Beams/energy:   Right and left 6 megavolt photon fields were used with field in field compensation to maximize homogeneity. Thermoplastic mask was used for positioning with weekly port film verification of isocenter  Narrative: The patient tolerated radiation treatment relatively well.   The patient did experience some mild nausea as well as headaches which responded to Percocet. He did not require dexamethasone 4 radiation-related cerebral edema.  Plan: The patient has completed radiation treatment. The patient will return to radiation oncology clinic for routine followup in one month. I advised them to call or return sooner if they have any questions or concerns related to their recovery or treatment. ________________________________  [name]. Kathrynn Running, M.D.

## 2013-06-08 ENCOUNTER — Encounter: Payer: Self-pay | Admitting: Internal Medicine

## 2013-06-08 ENCOUNTER — Ambulatory Visit (HOSPITAL_BASED_OUTPATIENT_CLINIC_OR_DEPARTMENT_OTHER): Payer: Medicaid Other

## 2013-06-08 VITALS — BP 132/85 | HR 82 | Temp 98.3°F | Resp 20

## 2013-06-08 DIAGNOSIS — C3491 Malignant neoplasm of unspecified part of right bronchus or lung: Secondary | ICD-10-CM

## 2013-06-08 DIAGNOSIS — Z5111 Encounter for antineoplastic chemotherapy: Secondary | ICD-10-CM

## 2013-06-08 DIAGNOSIS — C3490 Malignant neoplasm of unspecified part of unspecified bronchus or lung: Secondary | ICD-10-CM

## 2013-06-08 MED ORDER — SODIUM CHLORIDE 0.9 % IV SOLN
120.0000 mg/m2 | Freq: Once | INTRAVENOUS | Status: AC
  Administered 2013-06-08: 240 mg via INTRAVENOUS
  Filled 2013-06-08: qty 12

## 2013-06-08 MED ORDER — DEXAMETHASONE SODIUM PHOSPHATE 10 MG/ML IJ SOLN
10.0000 mg | Freq: Once | INTRAMUSCULAR | Status: AC
  Administered 2013-06-08: 10 mg via INTRAVENOUS

## 2013-06-08 MED ORDER — SODIUM CHLORIDE 0.9 % IV SOLN
Freq: Once | INTRAVENOUS | Status: AC
  Administered 2013-06-08: 14:00:00 via INTRAVENOUS

## 2013-06-08 MED ORDER — SODIUM CHLORIDE 0.9 % IJ SOLN
10.0000 mL | INTRAMUSCULAR | Status: DC | PRN
  Administered 2013-06-08: 10 mL
  Filled 2013-06-08: qty 10

## 2013-06-08 MED ORDER — DEXAMETHASONE SODIUM PHOSPHATE 10 MG/ML IJ SOLN
INTRAMUSCULAR | Status: AC
  Filled 2013-06-08: qty 1

## 2013-06-08 MED ORDER — ONDANSETRON 8 MG/50ML IVPB (CHCC)
8.0000 mg | Freq: Once | INTRAVENOUS | Status: AC
  Administered 2013-06-08: 8 mg via INTRAVENOUS

## 2013-06-08 MED ORDER — HEPARIN SOD (PORK) LOCK FLUSH 100 UNIT/ML IV SOLN
500.0000 [IU] | Freq: Once | INTRAVENOUS | Status: AC | PRN
  Administered 2013-06-08: 500 [IU]
  Filled 2013-06-08: qty 5

## 2013-06-08 MED ORDER — ONDANSETRON 8 MG/NS 50 ML IVPB
INTRAVENOUS | Status: AC
  Filled 2013-06-08: qty 8

## 2013-06-08 NOTE — Patient Instructions (Addendum)
[facility] Discharge Instructions for Patients Receiving Chemotherapy  Today you received the following chemotherapy agents VP-16  To help prevent nausea and vomiting after your treatment, we encourage you to take your nausea medication as prescribed by Dr Arbutus Ped.   If you develop nausea and vomiting that is not controlled by your nausea medication, call the clinic.   BELOW ARE SYMPTOMS THAT SHOULD BE REPORTED IMMEDIATELY:  *FEVER GREATER THAN 100.5 F  *CHILLS WITH OR WITHOUT FEVER  NAUSEA AND VOMITING THAT IS NOT CONTROLLED WITH YOUR NAUSEA MEDICATION  *UNUSUAL SHORTNESS OF BREATH  *UNUSUAL BRUISING OR BLEEDING  TENDERNESS IN MOUTH AND THROAT WITH OR WITHOUT PRESENCE OF ULCERS  *URINARY PROBLEMS  *BOWEL PROBLEMS  UNUSUAL RASH Items with * indicate a potential emergency and should be followed up as soon as possible.  Feel free to call the clinic you have any questions or concerns. The clinic phone number is [phone].

## 2013-06-08 NOTE — Progress Notes (Signed)
Patient is uninsured. Will see if he has applied for any and give possible options for asst.

## 2013-06-08 NOTE — Progress Notes (Signed)
Patient came in and advised he has applied for social sec and medicaid. He has denial letter.  He has an interview 06/25/13 with social services. He gets food stamps and stays with friends. He has a letter of support and will bring in with the letters of denials to me to make copy. I gave him the application for asst.

## 2013-06-09 ENCOUNTER — Ambulatory Visit (HOSPITAL_BASED_OUTPATIENT_CLINIC_OR_DEPARTMENT_OTHER): Payer: Medicaid Other

## 2013-06-09 VITALS — BP 179/83 | HR 68 | Temp 98.4°F

## 2013-06-09 DIAGNOSIS — Z5111 Encounter for antineoplastic chemotherapy: Secondary | ICD-10-CM

## 2013-06-09 DIAGNOSIS — C3491 Malignant neoplasm of unspecified part of right bronchus or lung: Secondary | ICD-10-CM

## 2013-06-09 DIAGNOSIS — C787 Secondary malignant neoplasm of liver and intrahepatic bile duct: Secondary | ICD-10-CM

## 2013-06-09 DIAGNOSIS — C3490 Malignant neoplasm of unspecified part of unspecified bronchus or lung: Secondary | ICD-10-CM

## 2013-06-09 MED ORDER — SODIUM CHLORIDE 0.9 % IJ SOLN
10.0000 mL | INTRAMUSCULAR | Status: DC | PRN
  Administered 2013-06-09: 10 mL
  Filled 2013-06-09: qty 10

## 2013-06-09 MED ORDER — DEXAMETHASONE SODIUM PHOSPHATE 10 MG/ML IJ SOLN
INTRAMUSCULAR | Status: AC
  Filled 2013-06-09: qty 1

## 2013-06-09 MED ORDER — SODIUM CHLORIDE 0.9 % IV SOLN
Freq: Once | INTRAVENOUS | Status: AC
  Administered 2013-06-09: 15:00:00 via INTRAVENOUS

## 2013-06-09 MED ORDER — HEPARIN SOD (PORK) LOCK FLUSH 100 UNIT/ML IV SOLN
500.0000 [IU] | Freq: Once | INTRAVENOUS | Status: AC | PRN
  Administered 2013-06-09: 500 [IU]
  Filled 2013-06-09: qty 5

## 2013-06-09 MED ORDER — DEXAMETHASONE SODIUM PHOSPHATE 10 MG/ML IJ SOLN
10.0000 mg | Freq: Once | INTRAMUSCULAR | Status: AC
  Administered 2013-06-09: 10 mg via INTRAVENOUS

## 2013-06-09 MED ORDER — SODIUM CHLORIDE 0.9 % IV SOLN
120.0000 mg/m2 | Freq: Once | INTRAVENOUS | Status: AC
  Administered 2013-06-09: 240 mg via INTRAVENOUS
  Filled 2013-06-09: qty 12

## 2013-06-09 MED ORDER — ONDANSETRON 8 MG/NS 50 ML IVPB
INTRAVENOUS | Status: AC
  Filled 2013-06-09: qty 8

## 2013-06-09 MED ORDER — ONDANSETRON 8 MG/50ML IVPB (CHCC)
8.0000 mg | Freq: Once | INTRAVENOUS | Status: AC
  Administered 2013-06-09: 8 mg via INTRAVENOUS

## 2013-06-09 NOTE — Patient Instructions (Signed)
[facility] Discharge Instructions for Patients Receiving Chemotherapy  Today you received the following chemotherapy agents etoposide  To help prevent nausea and vomiting after your treatment, we encourage you to take your nausea medication as needed   If you develop nausea and vomiting that is not controlled by your nausea medication, call the clinic.   BELOW ARE SYMPTOMS THAT SHOULD BE REPORTED IMMEDIATELY:  *FEVER GREATER THAN 100.5 F  *CHILLS WITH OR WITHOUT FEVER  NAUSEA AND VOMITING THAT IS NOT CONTROLLED WITH YOUR NAUSEA MEDICATION  *UNUSUAL SHORTNESS OF BREATH  *UNUSUAL BRUISING OR BLEEDING  TENDERNESS IN MOUTH AND THROAT WITH OR WITHOUT PRESENCE OF ULCERS  *URINARY PROBLEMS  *BOWEL PROBLEMS  UNUSUAL RASH Items with * indicate a potential emergency and should be followed up as soon as possible.  Feel free to call the clinic you have any questions or concerns. The clinic phone number is [phone].    

## 2013-06-10 ENCOUNTER — Telehealth: Payer: Self-pay | Admitting: Medical Oncology

## 2013-06-10 ENCOUNTER — Telehealth: Payer: Self-pay | Admitting: Internal Medicine

## 2013-06-10 ENCOUNTER — Ambulatory Visit: Payer: Self-pay

## 2013-06-10 NOTE — Telephone Encounter (Signed)
pt can not make todays inj and wanted to r/s to tomorrow...William Townsend fwd to desk nurse to advise pt on what needs to happen next.

## 2013-06-10 NOTE — Telephone Encounter (Signed)
Does not have ride today to come for neulasta . I r/s it for tomorrow and pt confirmed appt.

## 2013-06-11 ENCOUNTER — Ambulatory Visit (HOSPITAL_BASED_OUTPATIENT_CLINIC_OR_DEPARTMENT_OTHER): Payer: Medicaid Other

## 2013-06-11 ENCOUNTER — Telehealth: Payer: Self-pay | Admitting: *Deleted

## 2013-06-11 VITALS — BP 122/74 | HR 91 | Temp 98.7°F

## 2013-06-11 DIAGNOSIS — C3491 Malignant neoplasm of unspecified part of right bronchus or lung: Secondary | ICD-10-CM

## 2013-06-11 DIAGNOSIS — C3490 Malignant neoplasm of unspecified part of unspecified bronchus or lung: Secondary | ICD-10-CM

## 2013-06-11 DIAGNOSIS — Z5189 Encounter for other specified aftercare: Secondary | ICD-10-CM

## 2013-06-11 MED ORDER — PEGFILGRASTIM INJECTION 6 MG/0.6ML
6.0000 mg | Freq: Once | SUBCUTANEOUS | Status: AC
  Administered 2013-06-11: 6 mg via SUBCUTANEOUS
  Filled 2013-06-11: qty 0.6

## 2013-06-11 NOTE — Patient Instructions (Addendum)

## 2013-06-11 NOTE — Telephone Encounter (Signed)
William Townsend here for Neulasta injection following 1st carbo/vp chemotherapy.  States that he is tired but otherwise OK.  No nausea, vomiting, or diarrhea.  All questions answered  Knows to call if he has any problems or concerns.

## 2013-06-14 ENCOUNTER — Ambulatory Visit (INDEPENDENT_AMBULATORY_CARE_PROVIDER_SITE_OTHER): Payer: Medicaid Other | Admitting: Family Medicine

## 2013-06-14 ENCOUNTER — Other Ambulatory Visit (HOSPITAL_BASED_OUTPATIENT_CLINIC_OR_DEPARTMENT_OTHER): Payer: Medicaid Other | Admitting: Lab

## 2013-06-14 ENCOUNTER — Encounter: Payer: Self-pay | Admitting: Family Medicine

## 2013-06-14 ENCOUNTER — Ambulatory Visit (HOSPITAL_BASED_OUTPATIENT_CLINIC_OR_DEPARTMENT_OTHER): Payer: Medicaid Other | Admitting: Physician Assistant

## 2013-06-14 VITALS — BP 148/100 | HR 102 | Ht 72.0 in | Wt 161.0 lb

## 2013-06-14 VITALS — BP 128/85 | HR 89 | Temp 97.0°F | Resp 18 | Ht 72.0 in | Wt 160.2 lb

## 2013-06-14 DIAGNOSIS — F17200 Nicotine dependence, unspecified, uncomplicated: Secondary | ICD-10-CM

## 2013-06-14 DIAGNOSIS — C787 Secondary malignant neoplasm of liver and intrahepatic bile duct: Secondary | ICD-10-CM

## 2013-06-14 DIAGNOSIS — I10 Essential (primary) hypertension: Secondary | ICD-10-CM

## 2013-06-14 DIAGNOSIS — J309 Allergic rhinitis, unspecified: Secondary | ICD-10-CM

## 2013-06-14 DIAGNOSIS — C3490 Malignant neoplasm of unspecified part of unspecified bronchus or lung: Secondary | ICD-10-CM

## 2013-06-14 DIAGNOSIS — C7931 Secondary malignant neoplasm of brain: Secondary | ICD-10-CM

## 2013-06-14 DIAGNOSIS — E119 Type 2 diabetes mellitus without complications: Secondary | ICD-10-CM

## 2013-06-14 DIAGNOSIS — Z720 Tobacco use: Secondary | ICD-10-CM

## 2013-06-14 DIAGNOSIS — C3410 Malignant neoplasm of upper lobe, unspecified bronchus or lung: Secondary | ICD-10-CM

## 2013-06-14 DIAGNOSIS — K089 Disorder of teeth and supporting structures, unspecified: Secondary | ICD-10-CM

## 2013-06-14 DIAGNOSIS — Z23 Encounter for immunization: Secondary | ICD-10-CM

## 2013-06-14 DIAGNOSIS — C3491 Malignant neoplasm of unspecified part of right bronchus or lung: Secondary | ICD-10-CM

## 2013-06-14 DIAGNOSIS — R5381 Other malaise: Secondary | ICD-10-CM

## 2013-06-14 LAB — COMPREHENSIVE METABOLIC PANEL (CC13)
Albumin: 3.4 g/dL — ABNORMAL LOW (ref 3.5–5.0)
BUN: 22.5 mg/dL (ref 7.0–26.0)
Calcium: 9.2 mg/dL (ref 8.4–10.4)
Chloride: 96 mEq/L — ABNORMAL LOW (ref 98–109)
Creatinine: 1.1 mg/dL (ref 0.7–1.3)
Glucose: 530 mg/dl — ABNORMAL HIGH (ref 70–140)
Potassium: 4.5 mEq/L (ref 3.5–5.1)
Total Bilirubin: 1.42 mg/dL — ABNORMAL HIGH (ref 0.20–1.20)

## 2013-06-14 LAB — CBC WITH DIFFERENTIAL/PLATELET
BASO%: 0.4 % (ref 0.0–2.0)
Basophils Absolute: 0 10*3/uL (ref 0.0–0.1)
EOS%: 1 % (ref 0.0–7.0)
Eosinophils Absolute: 0 10*3/uL (ref 0.0–0.5)
HCT: 39.4 % (ref 38.4–49.9)
HGB: 14.1 g/dL (ref 13.0–17.1)
LYMPH%: 17.9 % (ref 14.0–49.0)
MCH: 33.7 pg — ABNORMAL HIGH (ref 27.2–33.4)
MCHC: 35.8 g/dL (ref 32.0–36.0)
MCV: 94.1 fL (ref 79.3–98.0)
MONO#: 0.1 10*3/uL (ref 0.1–0.9)
MONO%: 2.1 % (ref 0.0–14.0)
NEUT#: 2.2 10*3/uL (ref 1.5–6.5)
NEUT%: 78.6 % — ABNORMAL HIGH (ref 39.0–75.0)
Platelets: 74 10*3/uL — ABNORMAL LOW (ref 140–400)
RBC: 4.18 10*6/uL — ABNORMAL LOW (ref 4.20–5.82)
RDW: 13.5 % (ref 11.0–14.6)
WBC: 2.9 10*3/uL — ABNORMAL LOW (ref 4.0–10.3)
lymph#: 0.5 10*3/uL — ABNORMAL LOW (ref 0.9–3.3)

## 2013-06-14 MED ORDER — LORATADINE 10 MG PO TABS: 10.0000 mg | ORAL_TABLET | Freq: Every day | ORAL | Status: DC

## 2013-06-14 MED ORDER — OXYCODONE-ACETAMINOPHEN 5-325 MG PO TABS: 1.0000 | ORAL_TABLET | Freq: Four times a day (QID) | ORAL | Status: DC | PRN

## 2013-06-14 NOTE — Assessment & Plan Note (Signed)
Reports hx of seasonal allergies, worse in Spring and Fall. Symptoms of irritated throat with post-nasal drainage, watery and itchy eyes, rhinorrhea. Worse after spending significant time outside. Previously used Claritin several years ago, which was effective. Currently has not tried any medications OTC due to cost.  Plan: 1. Start Loratadine 10mg  daily

## 2013-06-14 NOTE — Patient Instructions (Addendum)
Dear William Townsend, Thank you for coming in to clinic today. It was good to see you!  Today we discussed your cancer, allergies, diabetes, and blood pressure. 1. I will send in a new rx for Claritin (Loratadine). Please take this daily for your allergies. 2. Please check your blood sugar twice daily (before meals or 2 hours after meals) and bring in your meter and readings to discuss options. We may just start a low dose of Lantus insulin daily, otherwise we may not start any treatment if you don't have any other symptoms. 3. Keep checking your BP and let the clinic know if it is continued to be significantly elevated >160s/100s. Otherwise, I do not plan to start a BP med at this time. 4. Congratulations on decreasing your smoking! Continue to reduce your smoking and I fully support your quit date of 06/28/13, before your next Chemotherapy.  BP - 148/100  Please schedule a follow-up appointment with me in 1 week for DM and BP. To review CBG readings and check BP.  If you have any other questions or concerns, please feel free to call the clinic to contact me. You may also schedule an earlier appointment if necessary.  However, if your symptoms get significantly worse, please go to the Emergency Department to seek immediate medical attention.  Saralyn Pilar, DO [facility]

## 2013-06-14 NOTE — Progress Notes (Addendum)
No images are attached to the encounter. No scans are attached to the encounter. No scans are attached to the encounter. [facility] SHARED VISIT PROGRESS NOTE  William Pilar, William Townsend [address] [place] [address]  DIAGNOSIS: Extensive Stage small cell lung cancer diagnosed in September of 2014.  PRIOR THERAPY: None  CURRENT THERAPY: Systemic chemotherapy with carboplatin for an AUC of 5 given on day 1 and etoposide 120 mg per meter squared given on days 1, 2 and 3 with Neulasta support given on day 4 status post 1 cycle  DISEASE STAGE: Extensive stage  CHEMOTHERAPY INTENT: Palliative  CURRENT # OF CHEMOTHERAPY CYCLES: 1  CURRENT ANTIEMETICS: Zofran, dexamethasone, Compazine  CURRENT SMOKING STATUS: Current smoker, strongly advised to quit smoking  ORAL CHEMOTHERAPY AND CONSENT: N./A.  CURRENT BISPHOSPHONATES USE: None  PAIN MANAGEMENT: Percocet  NARCOTICS INDUCED CONSTIPATION: None  LIVING WILL AND CODE STATUS:    INTERVAL HISTORY: William Townsend 52 y.o. male returns for a symptom management visit for followup of his recently diagnosed extensive stage small cell lung cancer. He is status post one cycle of systemic chemotherapy with carboplatin and etoposide with Neulasta support. He reports that he tolerated the first cycle of chemotherapy generally well with the exception of generalized malaise. He reports that for 3 days he didn't feel like eating. He has problems with dry mouth for the past 7-10 days. He has been using the biopsy in products with some relief. He reports that for the last 3 years he's had a lot of tooth breakage which tends to be worse when his mouth is dry. He states that they tend a break off and shards leaving sharp edges and making it painful to eat. He is been ingesting 2 Ensure dietary supplements daily as well as eating a lot of yogurt and other soft foods as he tolerates these better. He states that over the past couple days  he's been eating a bit better. He has lost a significant bout of weight but states that this is typical for him and recalls doing this when he was treated for his tonsillar carcinoma. He states that as he is able to ingest more his weight we will increase and stabilized.   MEDICAL HISTORY: Past Medical History  Diagnosis Date  . Atrial fibrillation   . Diabetes mellitus   . 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 Allergy   . Substance abuse   . Depression   . Stroke     3 previous strokes - Oct, Nov, and Dec 2012  . Throat cancer     September 2008  . Lung cancer     lobectomy in 2010  . Heart murmur     ALLERGIES:  is allergic to vicodin.  MEDICATIONS:  Current Outpatient Prescriptions  Medication Sig Dispense Refill  . lidocaine-prilocaine (EMLA) cream Apply topically as needed. Apply to port 1 hour before chemotherapy.  30 g  0  . loratadine (CLARITIN) 10 MG tablet Take 1 tablet (10 mg total) by mouth daily.  30 tablet  11  . oxyCODONE-acetaminophen (PERCOCET/ROXICET) 5-325 MG per tablet Take 1-2 tablets by mouth every 6 (six) hours as needed.  90 tablet  0  . prochlorperazine (COMPAZINE) 10 MG tablet Take 1 tablet (10 mg total) by mouth every 6 (six) hours as needed.  60 tablet  0   No current facility-administered medications for this visit.    SURGICAL HISTORY:  Past Surgical History  Procedure Laterality Date  .  Lobectomy    . Craniotomy Left 04/27/2013    Procedure: Left Occipital Craniotomy for resection of tumor with microscope;  Surgeon: William Cassis, William Townsend;  Location: [facility];  Service: Neurosurgery;  Laterality: Left;    REVIEW OF SYSTEMS:  A comprehensive review of systems was negative except for: Constitutional: positive for malaise Ears, nose, mouth, throat, and face: positive for Poor dentition with difficulty chewing   PHYSICAL EXAMINATION: General appearance: alert, cooperative and no distress Head: Normocephalic, without obvious abnormality,  atraumatic Neck: no adenopathy, no carotid bruit, no JVD, supple, symmetrical, trachea midline and thyroid not enlarged, symmetric, no tenderness/mass/nodules Lymph nodes: Cervical, supraclavicular, and axillary nodes normal. Resp: clear to auscultation bilaterally Cardio: regular rate and rhythm, S1, S2 normal, no murmur, click, rub or gallop GI: soft, non-tender; bowel sounds normal; no masses,  no organomegaly Extremities: extremities normal, atraumatic, no cyanosis or edema Neurologic: Alert and oriented X 3, normal strength and tone. Normal symmetric reflexes. Normal coordination and gait Mouth: Reveals a poor dentition with  6 remaining teeth. In the left upper posterior gum line there is a tooth with jagged edges The tongue and buccal mucosa appeared dry, no evidence of thrush or mucositis  ECOG PERFORMANCE STATUS: 1 - Symptomatic but completely ambulatory  Blood pressure 128/85, pulse 89, temperature 97 F (36.1 C), temperature source Oral, resp. rate 18, height 6' (1.829 m), weight 160 lb 3.2 oz (72.666 kg).  LABORATORY DATA: Lab Results  Component Value Date   WBC 2.9* 06/14/2013   HGB 14.1 06/14/2013   HCT 39.4 06/14/2013   MCV 94.1 06/14/2013   PLT 74* 06/14/2013      Chemistry      Component Value Date/Time   NA 132* 06/14/2013 1324   NA 136 06/03/2013 1310   K 4.5 06/14/2013 1324   K 3.8 06/03/2013 1310   CL 98 06/03/2013 1310   CO2 28 06/14/2013 1324   CO2 25 06/03/2013 1310   BUN 22.5 06/14/2013 1324   BUN 12 06/03/2013 1310   CREATININE 1.1 06/14/2013 1324   CREATININE 0.91 06/03/2013 1310   CREATININE 1.04 01/28/2012 1454      Component Value Date/Time   CALCIUM 9.2 06/14/2013 1324   CALCIUM 9.0 06/03/2013 1310   ALKPHOS 114 06/14/2013 1324   ALKPHOS 122* 04/21/2013 1041   AST 18 06/14/2013 1324   AST 29 04/21/2013 1041   ALT 24 06/14/2013 1324   ALT 28 04/21/2013 1041   BILITOT 1.42* 06/14/2013 1324   BILITOT 0.7 04/21/2013 1041       RADIOGRAPHIC STUDIES:  Ct  Abdomen Pelvis W Contrast  06/02/2013   *RADIOLOGY REPORT*  Clinical Data: Tonsillar carcinoma with lung metastases.  History of lung cancer.  Previous chemotherapy and radiation therapy.  CT ABDOMEN AND PELVIS WITH CONTRAST  Technique:  Multidetector CT imaging of the abdomen and pelvis was performed following the standard protocol during bolus administration of intravenous contrast.  Contrast: OMNIPAQUE IOHEXOL 300 MG/ML  SOLN  Comparison: CT chest 04/21/2013.  Findings: Lung bases show no acute findings.  Heart size normal. No pericardial or pleural effusion.  There are several heterogeneous lesions in the liver.  Index mass in the inferior right hepatic lobe measures 5.4 x 5.8 cm.  A 1.6 cm noncalcified lesion in the gallbladder is presumably a stone.  Adrenal glands and kidneys are unremarkable.  A heterogeneous lesion in the spleen measures 3.3 x 4.6 cm, likely stable.  Pancreas, stomach and bowel are unremarkable.  No pathologically enlarged lymph nodes.  Atherosclerotic calcification of the arterial vasculature without abdominal aortic aneurysm.  Scattered lymph nodes are sub centimeter in short axis size.  Tiny right paramidline ventral hernia contains fat.  A single lucent lesion in the left iliac wing measures 8 mm and is nonspecific.  Degenerative changes are seen in the spine.  IMPRESSION:  1.  Hepatic lesions are highly worrisome for metastatic disease. 2.  Splenic lesion is indeterminate.  Metastatic disease is within the differential diagnosis. 3.  Noncalcified lesion in the gallbladder is presumably a gallstone. 4.  Small well-circumscribed lucent lesion in the left iliac wing is nonspecific.  Attention on follow-up exams is warranted.   Original Report Authenticated By: William Townsend, M.D.   Ir Fluoro Guide Cv Line Right  06/03/2013   *RADIOLOGY REPORT*  TUNNELED CENTRAL VENOUS CATHETER WITH SUBCUTANEOUS RESERVOIR (PORTACATH) PLACEMENT WITH ULTRASOUND AND FLUOROSCOPIC  GUIDANCE  Date:  06/03/2013  Clinical History: 52 year old male with history of lung and tonsillar cancer.  Durable IV access required for chemotherapy.  Sedation: Moderate (conscious) sedation was administered during this procedure.  A total of threemg Versed and Fentanyl were administered intravenously.  The patient's vital signs were monitored continuously by radiology nursing throughout the course of the procedure.  Total sedation time: 25 minutes  Fluoroscopy Time: 18 seconds  Procedure:  The right neck and chest was prepped with chlorhexidine, and draped in the usual sterile fashion using maximum barrier technique (cap and mask, sterile gown, sterile gloves, large sterile sheet, hand hygiene and cutaneous antiseptic).  Antibiotic prophylaxis was provided with 2g Ancef administered IV one hour prior to skin incision.  Local anesthesia was attained by infiltration with 1% lidocaine with epinephrine.  Ultrasound demonstrated patency of the right internal jugular vein, and this was documented with an image.  Under real-time ultrasound guidance, this vein was accessed with a 21 gauge micropuncture needle and image documentation was performed.  A small dermatotomy was made at the access site with an 11 scalpel.  A 0.018" wire was advanced into the SVC and the access needle exchanged for a 2F micropuncture vascular sheath.  The 0.018" wire was then removed and a 0.035" wire advanced into the IVC.  An appropriate location for the subcutaneous reservoir was selected below the clavicle and an incision was made through the skin and underlying soft tissues.  The subcutaneous tissues were then dissected using a combination of blunt and sharp surgical technique and a pocket was formed.  A single lumen power injectable portacatheter was then tunneled through the subcutaneous tissues from the pocket to the dermatotomy and the port reservoir placed within the subcutaneous pocket.  The venous access site was then serially dilated and a  peel away vascular sheath placed over the wire.  The wire was removed and the port catheter advanced into position under fluoroscopic guidance. The catheter tip is positioned in the superior cavoatrial junction. This was documented with a spot image. The portacatheter was then tested and found to flush and aspirate well.  The port was flushed with saline followed by 100 units/mL heparinized saline.  The pocket was then closed in two layers using first subdermal inverted interrupted absorbable sutures followed by a running subcuticular suture.  The epidermis was then sealed with Dermabond. The dermatotomy at the venous access site was also closed with a single inverted subdermal suture and the epidermis sealed with Dermabond.  Complications:  None.  The patient tolerated the procedure well.  IMPRESSION:  Successful  placement of a right IJapproach PowerPort with ultrasound and fluoroscopic guidance.  The catheter is ready for use.  Signed,  William Big, William Townsend [facility]   Original Report Authenticated By: William Townsend, M.D.   Ir US Guide Vasc Access Right  06/03/2013   *RADIOLOGY REPORT*  TUNNELED CENTRAL VENOUS CATHETER WITH SUBCUTANEOUS RESERVOIR (PORTACATH) PLACEMENT WITH ULTRASOUND AND FLUOROSCOPIC  GUIDANCE  Date: 06/03/2013  Clinical History: 52 year old male with history of lung and tonsillar cancer.  Durable IV access required for chemotherapy.  Sedation: Moderate (conscious) sedation was administered during this procedure.  A total of threemg Versed and Fentanyl were administered intravenously.  The patient's vital signs were monitored continuously by radiology nursing throughout the course of the procedure.  Total sedation time: 25 minutes  Fluoroscopy Time: 18 seconds  Procedure:  The right neck and chest was prepped with chlorhexidine, and draped in the usual sterile fashion using maximum barrier technique (cap and mask, sterile  gown, sterile gloves, large sterile sheet, hand hygiene and cutaneous antiseptic).  Antibiotic prophylaxis was provided with 2g Ancef administered IV one hour prior to skin incision.  Local anesthesia was attained by infiltration with 1% lidocaine with epinephrine.  Ultrasound demonstrated patency of the right internal jugular vein, and this was documented with an image.  Under real-time ultrasound guidance, this vein was accessed with a 21 gauge micropuncture needle and image documentation was performed.  A small dermatotomy was made at the access site with an 11 scalpel.  A 0.018" wire was advanced into the SVC and the access needle exchanged for a 6F micropuncture vascular sheath.  The 0.018" wire was then removed and a 0.035" wire advanced into the IVC.  An appropriate location for the subcutaneous reservoir was selected below the clavicle and an incision was made through the skin and underlying soft tissues.  The subcutaneous tissues were then dissected using a combination of blunt and sharp surgical technique and a pocket was formed.  A single lumen power injectable portacatheter was then tunneled through the subcutaneous tissues from the pocket to the dermatotomy and the port reservoir placed within the subcutaneous pocket.  The venous access site was then serially dilated and a peel away vascular sheath placed over the wire.  The wire was removed and the port catheter advanced into position under fluoroscopic guidance. The catheter tip is positioned in the superior cavoatrial junction. This was documented with a spot image. The portacatheter was then tested and found to flush and aspirate well.  The port was flushed with saline followed by 100 units/mL heparinized saline.  The pocket was then closed in two layers using first subdermal inverted interrupted absorbable sutures followed by a running subcuticular suture.  The epidermis was then sealed with Dermabond. The dermatotomy at the venous access site was  also closed with a single inverted subdermal suture and the epidermis sealed with Dermabond.  Complications:  None.  The patient tolerated the procedure well.  IMPRESSION:  Successful placement of a right IJapproach PowerPort with ultrasound and fluoroscopic guidance.  The catheter is ready for use.  Signed,  William Big, William Townsend [facility]   Original Report Authenticated By: William Townsend, M.D.     ASSESSMENT/PLAN: Patient is a pleasant 52 year old white male recently diagnosed with extensive stage small cell lung cancer now status post one cycle of systemic chemotherapy with carboplatin for an AUC of 5 given on day 1 and etoposide 120 mg  per meter squared given on days 1, 2 and 3 with Neulasta support given on day 4. Overall he is tolerated his chemotherapy relatively well with the exception of generalized malaise. Patient was discussed with also seen by William Townsend. For his poor dentition we will refer him to William Townsend for further evaluation and management. He'll continue with weekly labs consisting of a CBC differential and C. met. He'll return in 2 weeks prior to start of cycle #2 of his systemic chemotherapy with carboplatin and etoposide with Neulasta support for another symptom management visit the  William Slipper, William Townsend  All questions were answered. The patient knows to call the clinic with any problems, questions or concerns. We can certainly see the patient much sooner if necessary.  ADDENDUM: Hematology/Oncology Attending: I have a face to face encounter with the patient. I recommended his care plan. This is a 52 years old white male recently diagnosed with extensive stage small cell lung cancer and currently undergoing systemic chemotherapy with carboplatin and etoposide status post 1 cycle. The patient related the first week of his treatment fairly well with no significant adverse effects except for mild fatigue. He denied  having any significant nausea or vomiting. He lost a few pounds recently and the patient has poor dentition. I will refer him to William Townsend for evaluation. He would come back for followup visit in 2 weeks with the next cycle of his chemotherapy.  He was advised to call immediately if he has any concerning symptoms in the interval especially any fever or chills. William Matte., William Townsend 06/15/2013

## 2013-06-14 NOTE — Progress Notes (Signed)
Subjective:     Patient ID: William Townsend, male   DOB: 05/19/1961, 52 y.o.   MRN: [id number]  HPI  ALLERGIES: Reports hx of seasonal allergies, worse in Spring and Fall. Symptoms of irritated throat with post-nasal drainage, watery and itchy eyes, rhinorrhea. Worse after spending significant time outside.  Previously used Claritin several years ago, which was effective. Currently has not tried any medications OTC due to cost.  HYPERTENSION: BP elevated today 148/100. Reports frequent BP check at every Oncology visit. Last documented 06/11/13 at 122/74.  Meds - None (no prior hx of BP medications)  Denies CP, dyspnea, claudication, edema, dizziness / lightheadedness  Admits HA (likely attributed to recent metastatic brain disease s/p craniotomy resection).  DIABETES, TYPE 2: Hx DM2 without consistent treatment in past. Previously able to tolerate Metformin, no regular insulin use.  During recent hospitalization (04/2013), required significant doses of Decadron, resulted in poorly controlled CBGs. On discharge, patient intolerant of Metformin due to severe GI disturbance.  Currently receiving steroids due to chemotherapy.  Does not check CBGs (recent readings at Oncology office 160-350s), has glucometer and test strips. Has Lantus insulin samples, but has not used them. Patient reports that he is not concerned with his blood sugars, as he understands that he most likely will not live long enough to develop future complications.  Denies polydipsia, polyphagia, neuropathy.  Admits polyuria (however, significant inc drinking water due to chemo, and denies any inc freq during weeks without chemo)  TOBACCO ABUSE: Reports decreased smoking from 12 cigs daily to 8 cigs. He has had success with reducing amount by 2-4 weekly for past 1-2 weeks.  Goal to continue reducing cigs until quit within next few weeks. Set quit date of 06/28/2013. (Hopes to quit prior to next chemotherapy treatment.)  Denied  interest in additional assistance of nicotine therapy or medications.  METASTATIC DISEASE, R-SMALL CELL CA Followed by [facility], recently completed radiation therapy, and currently receiving systemic palliative chemotherapy (x 4 treatments, q 3 weeks). Plan to do CT for surveillance, and may require up to 2 additional treatments, max of 6 total.  Overall, he reports that he is tolerating chemotherapy well. It is less harsh than previous treatments in 2010.  Admits improvement with HA (s/p brain met resection), currently taking Percocet 5/325 half tab up to 4x daily, and sometimes does not require any Percocet.  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  Social Hx - Current smoker 8 cig daily  Review of Systems  See above HPI.     Objective:   Physical Exam  BP 148/100  Pulse 102  Ht 6' (1.829 m)  Wt 161 lb (73.029 kg)  BMI 21.83 kg/m2  General - very pleasant, thin appearing 52 yr male, conversational, NAD HEENT - PERRLA, EOMI, mild erythema and edema bilateral nasal turbinates, pharynx with some observed posterior drainage, bilateral sclera mildly injected, clear rhinorrhea noted Heart - RRR, normal S1, S2, no murmurs heard Lungs - CTAB, no wheezing, crackles, or rhonchi. No inc WOB Ext - No edema. +2 peripheral pulses intact Neuro - grossly intact, normal muscle str 5/5 bilaterally, normal gait, intact distal sensation to light touch

## 2013-06-14 NOTE — Assessment & Plan Note (Signed)
Reports decreased smoking from 12 cigs daily to 8 cigs. He has had success with reducing amount by 2-4 weekly for past 1-2 weeks. Goal to continue reducing cigs until quit within next few weeks. Set quit date of 06/28/2013. (Hopes to quit prior to next chemotherapy treatment.) Denied interest in additional assistance of nicotine therapy or medications.  Plan: 1. Encourage current smoking cessation plan. Quit date 06/28/2013 2. Offered support, assistance.

## 2013-06-14 NOTE — Assessment & Plan Note (Addendum)
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 Followed by [facility], recently completed radiation therapy, and currently receiving systemic palliative chemotherapy (x 4 treatments, q 3 weeks). Plan to do CT for surveillance, and may require up to 2 additional treatments, max of 6 total. Overall, he reports that he is tolerating chemotherapy well. It is less harsh than previous treatments in 2010. Admits improvement with HA (s/p brain met resection), currently taking Percocet 5/325 half tab up to 4x daily, and sometimes does not require any Percocet.  Plan: 1. Refill Percocet 5/325, 1-2 tab q 6 hr PRN, #90, 0 refill, printed. May take reduced dose as he has been doing. Increased need with chemotherapy. 2. Continue to follow with Oncology and determine course of therapy. 3. Patient understanding of prognosis, plan, and options. He appears to be coping with this well. Offered support.

## 2013-06-14 NOTE — Assessment & Plan Note (Addendum)
Hx DM2 without consistent treatment in past. Previously able to tolerate Metformin, no regular insulin use. During recent hospitalization (04/2013), required significant doses of Decadron, resulted in poorly controlled CBGs. On discharge, patient intolerant of Metformin due to severe GI disturbance. Currently receiving steroids due to chemotherapy. Does not check CBGs (recent readings at Oncology office 160-350s), has glucometer and test strips. Has Lantus insulin samples, but has not used them. Patient reports that he is not concerned with his blood sugars, as he understands that he most likely will not live long enough to develop future complications. Denies polydipsia, polyphagia, neuropathy. Admits polyuria (however, significant inc drinking water due to chemo, and denies any inc freq during weeks without chemo)  Plan: 1. Advised to check CBGs 2x daily for 1 week, bring readings to f/u apt in 1 week to evaluate current CBG control. Consider starting low dose Lantus insulin therapy daily if patient agreeable. 2. Discussed goals of care. We will likely plan to treat hyperglycemia only if symptomatic. Due to chemo and steroid therapy, CBGs will fluctuate. Unlikely to develop long-term complications, due to poor prognosis with metastatic CA.

## 2013-06-14 NOTE — Assessment & Plan Note (Signed)
BP elevated today 148/100. Reports frequent BP check at every Oncology visit. Last documented 06/11/13 at 122/74. Meds -  None (no prior hx of BP medications) Denies CP, dyspnea, claudication, edema, dizziness / lightheadedness Admits HA.  Plan:  1. Discussed goals of care. In patient with metastatic small cell carcinoma, currently receiving palliative chemotherapy, plan to treat BP only if significantly elevated (>180/100) and causing side-effects worsening HA, syncope, CP. 2. Plan to re-check BP in clinic in 1 week. Continue to check at Oncology visits. 3. Will revisit goals of care in 1 week. Unlikely to start anti-HTN medication at this time, but may consider monotherapy with ACEi/ARB. 4. Continue lifestyle modifications

## 2013-06-15 NOTE — Patient Instructions (Addendum)
We are referring you to a dentist to evaluate your remaining teeth Continue weekly labs as scheduled Follow up in 2 weeks

## 2013-06-21 ENCOUNTER — Other Ambulatory Visit (HOSPITAL_BASED_OUTPATIENT_CLINIC_OR_DEPARTMENT_OTHER): Payer: Medicaid Other

## 2013-06-21 DIAGNOSIS — C3491 Malignant neoplasm of unspecified part of right bronchus or lung: Secondary | ICD-10-CM

## 2013-06-21 DIAGNOSIS — C3410 Malignant neoplasm of upper lobe, unspecified bronchus or lung: Secondary | ICD-10-CM

## 2013-06-21 LAB — COMPREHENSIVE METABOLIC PANEL (CC13)
ALT: 28 U/L (ref 0–55)
AST: 23 U/L (ref 5–34)
Albumin: 3.4 g/dL — ABNORMAL LOW (ref 3.5–5.0)
BUN: 7 mg/dL (ref 7.0–26.0)
CO2: 30 mEq/L — ABNORMAL HIGH (ref 22–29)
Calcium: 9.2 mg/dL (ref 8.4–10.4)
Chloride: 98 mEq/L (ref 98–109)
Creatinine: 1 mg/dL (ref 0.7–1.3)
Potassium: 4 mEq/L (ref 3.5–5.1)
Sodium: 137 mEq/L (ref 136–145)
Total Protein: 6.7 g/dL (ref 6.4–8.3)

## 2013-06-21 LAB — CBC WITH DIFFERENTIAL/PLATELET
Basophils Absolute: 0 10*3/uL (ref 0.0–0.1)
EOS%: 0.3 % (ref 0.0–7.0)
Eosinophils Absolute: 0 10*3/uL (ref 0.0–0.5)
HGB: 13.7 g/dL (ref 13.0–17.1)
MCH: 33.8 pg — ABNORMAL HIGH (ref 27.2–33.4)
MONO#: 0.8 10*3/uL (ref 0.1–0.9)
NEUT#: 8 10*3/uL — ABNORMAL HIGH (ref 1.5–6.5)
NEUT%: 79.5 % — ABNORMAL HIGH (ref 39.0–75.0)
RDW: 13.6 % (ref 11.0–14.6)
lymph#: 1.2 10*3/uL (ref 0.9–3.3)

## 2013-06-28 ENCOUNTER — Ambulatory Visit (HOSPITAL_BASED_OUTPATIENT_CLINIC_OR_DEPARTMENT_OTHER): Payer: Medicaid Other

## 2013-06-28 ENCOUNTER — Encounter (INDEPENDENT_AMBULATORY_CARE_PROVIDER_SITE_OTHER): Payer: Self-pay

## 2013-06-28 ENCOUNTER — Ambulatory Visit: Payer: Self-pay | Admitting: Family Medicine

## 2013-06-28 ENCOUNTER — Ambulatory Visit (HOSPITAL_BASED_OUTPATIENT_CLINIC_OR_DEPARTMENT_OTHER): Payer: Medicaid Other | Admitting: Physician Assistant

## 2013-06-28 ENCOUNTER — Other Ambulatory Visit (HOSPITAL_BASED_OUTPATIENT_CLINIC_OR_DEPARTMENT_OTHER): Payer: Medicaid Other

## 2013-06-28 VITALS — BP 135/85 | HR 86 | Temp 98.3°F | Resp 18 | Wt 169.0 lb

## 2013-06-28 DIAGNOSIS — C3410 Malignant neoplasm of upper lobe, unspecified bronchus or lung: Secondary | ICD-10-CM

## 2013-06-28 DIAGNOSIS — C7951 Secondary malignant neoplasm of bone: Secondary | ICD-10-CM

## 2013-06-28 DIAGNOSIS — C787 Secondary malignant neoplasm of liver and intrahepatic bile duct: Secondary | ICD-10-CM

## 2013-06-28 DIAGNOSIS — C3491 Malignant neoplasm of unspecified part of right bronchus or lung: Secondary | ICD-10-CM

## 2013-06-28 DIAGNOSIS — C7931 Secondary malignant neoplasm of brain: Secondary | ICD-10-CM

## 2013-06-28 DIAGNOSIS — Z5111 Encounter for antineoplastic chemotherapy: Secondary | ICD-10-CM

## 2013-06-28 LAB — CBC WITH DIFFERENTIAL/PLATELET
BASO%: 1 % (ref 0.0–2.0)
Basophils Absolute: 0.1 10e3/uL (ref 0.0–0.1)
EOS%: 0.1 % (ref 0.0–7.0)
Eosinophils Absolute: 0 10e3/uL (ref 0.0–0.5)
HCT: 37.3 % — ABNORMAL LOW (ref 38.4–49.9)
HGB: 13.3 g/dL (ref 13.0–17.1)
LYMPH%: 10.6 % — ABNORMAL LOW (ref 14.0–49.0)
MCH: 33.2 pg (ref 27.2–33.4)
MCHC: 35.6 g/dL (ref 32.0–36.0)
MCV: 93.2 fL (ref 79.3–98.0)
MONO#: 0.8 10e3/uL (ref 0.1–0.9)
MONO%: 7.8 % (ref 0.0–14.0)
NEUT#: 8.6 10e3/uL — ABNORMAL HIGH (ref 1.5–6.5)
NEUT%: 80.5 % — ABNORMAL HIGH (ref 39.0–75.0)
Platelets: 218 10e3/uL (ref 140–400)
RBC: 4 10e6/uL — ABNORMAL LOW (ref 4.20–5.82)
RDW: 13.7 % (ref 11.0–14.6)
WBC: 10.7 10e3/uL — ABNORMAL HIGH (ref 4.0–10.3)
lymph#: 1.1 10e3/uL (ref 0.9–3.3)
nRBC: 1 % — ABNORMAL HIGH (ref 0–0)

## 2013-06-28 LAB — COMPREHENSIVE METABOLIC PANEL (CC13)
Albumin: 3.1 g/dL — ABNORMAL LOW (ref 3.5–5.0)
Alkaline Phosphatase: 142 U/L (ref 40–150)
Anion Gap: 10 mEq/L (ref 3–11)
BUN: 6.3 mg/dL — ABNORMAL LOW (ref 7.0–26.0)
CO2: 25 mEq/L (ref 22–29)
Calcium: 8.6 mg/dL (ref 8.4–10.4)
Chloride: 103 mEq/L (ref 98–109)
Glucose: 169 mg/dl — ABNORMAL HIGH (ref 70–140)
Potassium: 3.7 mEq/L (ref 3.5–5.1)
Sodium: 138 mEq/L (ref 136–145)
Total Bilirubin: 0.43 mg/dL (ref 0.20–1.20)
Total Protein: 6.1 g/dL — ABNORMAL LOW (ref 6.4–8.3)

## 2013-06-28 MED ORDER — SODIUM CHLORIDE 0.9 % IV SOLN
560.0000 mg | Freq: Once | INTRAVENOUS | Status: AC
  Administered 2013-06-28: 560 mg via INTRAVENOUS
  Filled 2013-06-28: qty 56

## 2013-06-28 MED ORDER — SODIUM CHLORIDE 0.9 % IV SOLN
120.0000 mg/m2 | Freq: Once | INTRAVENOUS | Status: AC
  Administered 2013-06-28: 240 mg via INTRAVENOUS
  Filled 2013-06-28: qty 12

## 2013-06-28 MED ORDER — ONDANSETRON 16 MG/50ML IVPB (CHCC)
16.0000 mg | Freq: Once | INTRAVENOUS | Status: AC
  Administered 2013-06-28: 16 mg via INTRAVENOUS

## 2013-06-28 MED ORDER — HEPARIN SOD (PORK) LOCK FLUSH 100 UNIT/ML IV SOLN
500.0000 [IU] | Freq: Once | INTRAVENOUS | Status: AC | PRN
  Administered 2013-06-28: 500 [IU]
  Filled 2013-06-28: qty 5

## 2013-06-28 MED ORDER — SODIUM CHLORIDE 0.9 % IV SOLN
Freq: Once | INTRAVENOUS | Status: AC
  Administered 2013-06-28: 14:00:00 via INTRAVENOUS

## 2013-06-28 MED ORDER — ONDANSETRON 16 MG/50ML IVPB (CHCC)
INTRAVENOUS | Status: AC
  Filled 2013-06-28: qty 16

## 2013-06-28 MED ORDER — DEXAMETHASONE SODIUM PHOSPHATE 20 MG/5ML IJ SOLN
20.0000 mg | Freq: Once | INTRAMUSCULAR | Status: AC
  Administered 2013-06-28: 20 mg via INTRAVENOUS

## 2013-06-28 MED ORDER — SODIUM CHLORIDE 0.9 % IJ SOLN
10.0000 mL | INTRAMUSCULAR | Status: DC | PRN
  Administered 2013-06-28: 10 mL
  Filled 2013-06-28: qty 10

## 2013-06-28 MED ORDER — DEXAMETHASONE SODIUM PHOSPHATE 20 MG/5ML IJ SOLN
INTRAMUSCULAR | Status: AC
  Filled 2013-06-28: qty 5

## 2013-06-28 NOTE — Patient Instructions (Signed)
Continue weekly labs as scheduled Followup with Dr. Mohamed in 3 weeks with a restaging CT scan of your chest, abdomen and pelvis to reevaluate your disease. 

## 2013-06-28 NOTE — Patient Instructions (Addendum)
[facility] Discharge Instructions for Patients Receiving Chemotherapy  Today you received the following chemotherapy agent Carboplatin and Etoposide (VP-16).  To help prevent nausea and vomiting after your treatment, we encourage you to take your nausea medication.   If you develop nausea and vomiting that is not controlled by your nausea medication, call the clinic.   BELOW ARE SYMPTOMS THAT SHOULD BE REPORTED IMMEDIATELY:  *FEVER GREATER THAN 100.5 F  *CHILLS WITH OR WITHOUT FEVER  NAUSEA AND VOMITING THAT IS NOT CONTROLLED WITH YOUR NAUSEA MEDICATION  *UNUSUAL SHORTNESS OF BREATH  *UNUSUAL BRUISING OR BLEEDING  TENDERNESS IN MOUTH AND THROAT WITH OR WITHOUT PRESENCE OF ULCERS  *URINARY PROBLEMS  *BOWEL PROBLEMS  UNUSUAL RASH Items with * indicate a potential emergency and should be followed up as soon as possible.  Feel free to call the clinic you have any questions or concerns. The clinic phone number is [phone].

## 2013-06-29 ENCOUNTER — Ambulatory Visit (HOSPITAL_BASED_OUTPATIENT_CLINIC_OR_DEPARTMENT_OTHER): Payer: Medicaid Other

## 2013-06-29 ENCOUNTER — Telehealth: Payer: Self-pay | Admitting: Internal Medicine

## 2013-06-29 ENCOUNTER — Telehealth: Payer: Self-pay | Admitting: *Deleted

## 2013-06-29 VITALS — BP 140/81 | HR 92 | Temp 98.0°F | Resp 18

## 2013-06-29 DIAGNOSIS — C3410 Malignant neoplasm of upper lobe, unspecified bronchus or lung: Secondary | ICD-10-CM

## 2013-06-29 DIAGNOSIS — Z5111 Encounter for antineoplastic chemotherapy: Secondary | ICD-10-CM

## 2013-06-29 DIAGNOSIS — C787 Secondary malignant neoplasm of liver and intrahepatic bile duct: Secondary | ICD-10-CM

## 2013-06-29 DIAGNOSIS — C3491 Malignant neoplasm of unspecified part of right bronchus or lung: Secondary | ICD-10-CM

## 2013-06-29 MED ORDER — DEXAMETHASONE SODIUM PHOSPHATE 10 MG/ML IJ SOLN
INTRAMUSCULAR | Status: AC
  Filled 2013-06-29: qty 1

## 2013-06-29 MED ORDER — ONDANSETRON 8 MG/50ML IVPB (CHCC)
8.0000 mg | Freq: Once | INTRAVENOUS | Status: AC
  Administered 2013-06-29: 8 mg via INTRAVENOUS

## 2013-06-29 MED ORDER — DEXAMETHASONE SODIUM PHOSPHATE 10 MG/ML IJ SOLN
10.0000 mg | Freq: Once | INTRAMUSCULAR | Status: AC
  Administered 2013-06-29: 10 mg via INTRAVENOUS

## 2013-06-29 MED ORDER — SODIUM CHLORIDE 0.9 % IV SOLN
120.0000 mg/m2 | Freq: Once | INTRAVENOUS | Status: AC
  Administered 2013-06-29: 240 mg via INTRAVENOUS
  Filled 2013-06-29: qty 12

## 2013-06-29 MED ORDER — HEPARIN SOD (PORK) LOCK FLUSH 100 UNIT/ML IV SOLN
500.0000 [IU] | Freq: Once | INTRAVENOUS | Status: AC | PRN
Start: 2013-06-29 — End: 2013-06-29
  Administered 2013-06-29: 500 [IU]
  Filled 2013-06-29: qty 5

## 2013-06-29 MED ORDER — ONDANSETRON 8 MG/NS 50 ML IVPB
INTRAVENOUS | Status: AC
  Filled 2013-06-29: qty 8

## 2013-06-29 MED ORDER — SODIUM CHLORIDE 0.9 % IV SOLN
Freq: Once | INTRAVENOUS | Status: AC
  Administered 2013-06-29: 14:00:00 via INTRAVENOUS

## 2013-06-29 MED ORDER — SODIUM CHLORIDE 0.9 % IJ SOLN
10.0000 mL | INTRAMUSCULAR | Status: DC | PRN
  Administered 2013-06-29: 10 mL
  Filled 2013-06-29: qty 10

## 2013-06-29 NOTE — Telephone Encounter (Signed)
gv and printed aptp sched and avs for pt for Jan 2015...lvm for William Townsend in IR for port removal she will call pt.

## 2013-06-29 NOTE — Progress Notes (Addendum)
No images are attached to the encounter. No scans are attached to the encounter. No scans are attached to the encounter. [facility] SHARED VISIT PROGRESS NOTE  Saralyn Pilar, DO [address] [place] [address]  DIAGNOSIS: Extensive Stage small cell lung cancer diagnosed in September of 2014.  PRIOR THERAPY: None  CURRENT THERAPY: Systemic chemotherapy with carboplatin for an AUC of 5 given on day 1 and etoposide 120 mg per meter squared given on days 1, 2 and 3 with Neulasta support given on day 4 status post 1 cycle  DISEASE STAGE: Extensive stage  CHEMOTHERAPY INTENT: Palliative  CURRENT # OF CHEMOTHERAPY CYCLES: 2  CURRENT ANTIEMETICS: Zofran, dexamethasone, Compazine  CURRENT SMOKING STATUS: Current smoker, strongly advised to quit smoking  ORAL CHEMOTHERAPY AND CONSENT: N./A.  CURRENT BISPHOSPHONATES USE: None  PAIN MANAGEMENT: Percocet  NARCOTICS INDUCED CONSTIPATION: None  LIVING WILL AND CODE STATUS:    INTERVAL HISTORY: William Townsend 52 y.o. male returns for a follow up visit for followup of his recently diagnosed extensive stage small cell lung cancer. He is status post one cycle of systemic chemotherapy with carboplatin and etoposide with Neulasta support. He reports that he tolerated the first cycle of chemotherapy generally well with the exception of generalized malaise.  He continues to have problems with dry mouth. He has been using the biotene products with some relief. He has adjusted his diet and is eating softer foods including yogurt as he finds it easier to get down with his broken tooth. He is drinking 2 Ensure daily. We have referred him to Dr. Kristin Bruins however he is not received an appointment as yet.    MEDICAL HISTORY: Past Medical History  Diagnosis Date  . Atrial fibrillation   . Diabetes mellitus   . 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 Allergy   . Substance abuse   . Depression   . Stroke     3 previous strokes  - Oct, Nov, and Dec 2012  . Throat cancer     September 2008  . Lung cancer     lobectomy in 2010  . Heart murmur     ALLERGIES:  is allergic to vicodin.  MEDICATIONS:  Current Outpatient Prescriptions  Medication Sig Dispense Refill  . lidocaine-prilocaine (EMLA) cream Apply topically as needed. Apply to port 1 hour before chemotherapy.  30 g  0  . loratadine (CLARITIN) 10 MG tablet Take 1 tablet (10 mg total) by mouth daily.  30 tablet  11  . oxyCODONE-acetaminophen (PERCOCET/ROXICET) 5-325 MG per tablet Take 1-2 tablets by mouth every 6 (six) hours as needed.  90 tablet  0  . prochlorperazine (COMPAZINE) 10 MG tablet Take 1 tablet (10 mg total) by mouth every 6 (six) hours as needed.  60 tablet  0   No current facility-administered medications for this visit.    SURGICAL HISTORY:  Past Surgical History  Procedure Laterality Date  . Lobectomy    . Craniotomy Left 04/27/2013    Procedure: Left Occipital Craniotomy for resection of tumor with microscope;  Surgeon: Karn Cassis, MD;  Location: [facility];  Service: Neurosurgery;  Laterality: Left;    REVIEW OF SYSTEMS:  A comprehensive review of systems was negative except for: Constitutional: positive for malaise Ears, nose, mouth, throat, and face: positive for Poor dentition with difficulty chewing   PHYSICAL EXAMINATION: General appearance: alert, cooperative and no distress Head: Normocephalic, without obvious abnormality, atraumatic Neck: no adenopathy, no carotid bruit,  no JVD, supple, symmetrical, trachea midline and thyroid not enlarged, symmetric, no tenderness/mass/nodules Lymph nodes: Cervical, supraclavicular, and axillary nodes normal. Resp: clear to auscultation bilaterally Cardio: regular rate and rhythm, S1, S2 normal, no murmur, click, rub or gallop GI: soft, non-tender; bowel sounds normal; no masses,  no organomegaly Extremities: extremities normal, atraumatic, no cyanosis or edema Neurologic: Alert and  oriented X 3, normal strength and tone. Normal symmetric reflexes. Normal coordination and gait Mouth: Reveals a poor dentition with  6 remaining teeth. In the left upper posterior gum line there is a tooth with jagged edges The tongue and buccal mucosa appear dry, no evidence of thrush or mucositis  ECOG PERFORMANCE STATUS: 1 - Symptomatic but completely ambulatory  There were no vitals taken for this visit.  LABORATORY DATA: Lab Results  Component Value Date   WBC 10.7* 06/28/2013   HGB 13.3 06/28/2013   HCT 37.3* 06/28/2013   MCV 93.2 06/28/2013   PLT 218 06/28/2013      Chemistry      Component Value Date/Time   NA 138 06/28/2013 1256   NA 136 06/03/2013 1310   K 3.7 06/28/2013 1256   K 3.8 06/03/2013 1310   CL 98 06/03/2013 1310   CO2 25 06/28/2013 1256   CO2 25 06/03/2013 1310   BUN 6.3* 06/28/2013 1256   BUN 12 06/03/2013 1310   CREATININE 0.8 06/28/2013 1256   CREATININE 0.91 06/03/2013 1310   CREATININE 1.04 01/28/2012 1454      Component Value Date/Time   CALCIUM 8.6 06/28/2013 1256   CALCIUM 9.0 06/03/2013 1310   ALKPHOS 142 06/28/2013 1256   ALKPHOS 122* 04/21/2013 1041   AST 21 06/28/2013 1256   AST 29 04/21/2013 1041   ALT 15 06/28/2013 1256   ALT 28 04/21/2013 1041   BILITOT 0.43 06/28/2013 1256   BILITOT 0.7 04/21/2013 1041       RADIOGRAPHIC STUDIES:  Ct Abdomen Pelvis W Contrast  06/02/2013   *RADIOLOGY REPORT*  Clinical Data: Tonsillar carcinoma with lung metastases.  History of lung cancer.  Previous chemotherapy and radiation therapy.  CT ABDOMEN AND PELVIS WITH CONTRAST  Technique:  Multidetector CT imaging of the abdomen and pelvis was performed following the standard protocol during bolus administration of intravenous contrast.  Contrast: OMNIPAQUE IOHEXOL 300 MG/ML  SOLN  Comparison: CT chest 04/21/2013.  Findings: Lung bases show no acute findings.  Heart size normal. No pericardial or pleural effusion.  There are several heterogeneous lesions  in the liver.  Index mass in the inferior right hepatic lobe measures 5.4 x 5.8 cm.  A 1.6 cm noncalcified lesion in the gallbladder is presumably a stone.  Adrenal glands and kidneys are unremarkable.  A heterogeneous lesion in the spleen measures 3.3 x 4.6 cm, likely stable.  Pancreas, stomach and bowel are unremarkable.  No pathologically enlarged lymph nodes.  Atherosclerotic calcification of the arterial vasculature without abdominal aortic aneurysm.  Scattered lymph nodes are sub centimeter in short axis size.  Tiny right paramidline ventral hernia contains fat.  A single lucent lesion in the left iliac wing measures 8 mm and is nonspecific.  Degenerative changes are seen in the spine.  IMPRESSION:  1.  Hepatic lesions are highly worrisome for metastatic disease. 2.  Splenic lesion is indeterminate.  Metastatic disease is within the differential diagnosis. 3.  Noncalcified lesion in the gallbladder is presumably a gallstone. 4.  Small well-circumscribed lucent lesion in the left iliac wing is nonspecific.  Attention on  follow-up exams is warranted.   Original Report Authenticated By: Leanna Battles, M.D.   Ir Fluoro Guide Cv Line Right  06/03/2013   *RADIOLOGY REPORT*  TUNNELED CENTRAL VENOUS CATHETER WITH SUBCUTANEOUS RESERVOIR (PORTACATH) PLACEMENT WITH ULTRASOUND AND FLUOROSCOPIC  GUIDANCE  Date: 06/03/2013  Clinical History: 52 year old male with history of lung and tonsillar cancer.  Durable IV access required for chemotherapy.  Sedation: Moderate (conscious) sedation was administered during this procedure.  A total of threemg Versed and Fentanyl were administered intravenously.  The patient's vital signs were monitored continuously by radiology nursing throughout the course of the procedure.  Total sedation time: 25 minutes  Fluoroscopy Time: 18 seconds  Procedure:  The right neck and chest was prepped with chlorhexidine, and draped in the usual sterile fashion using maximum barrier technique  (cap and mask, sterile gown, sterile gloves, large sterile sheet, hand hygiene and cutaneous antiseptic).  Antibiotic prophylaxis was provided with 2g Ancef administered IV one hour prior to skin incision.  Local anesthesia was attained by infiltration with 1% lidocaine with epinephrine.  Ultrasound demonstrated patency of the right internal jugular vein, and this was documented with an image.  Under real-time ultrasound guidance, this vein was accessed with a 21 gauge micropuncture needle and image documentation was performed.  A small dermatotomy was made at the access site with an 11 scalpel.  A 0.018" wire was advanced into the SVC and the access needle exchanged for a 48F micropuncture vascular sheath.  The 0.018" wire was then removed and a 0.035" wire advanced into the IVC.  An appropriate location for the subcutaneous reservoir was selected below the clavicle and an incision was made through the skin and underlying soft tissues.  The subcutaneous tissues were then dissected using a combination of blunt and sharp surgical technique and a pocket was formed.  A single lumen power injectable portacatheter was then tunneled through the subcutaneous tissues from the pocket to the dermatotomy and the port reservoir placed within the subcutaneous pocket.  The venous access site was then serially dilated and a peel away vascular sheath placed over the wire.  The wire was removed and the port catheter advanced into position under fluoroscopic guidance. The catheter tip is positioned in the superior cavoatrial junction. This was documented with a spot image. The portacatheter was then tested and found to flush and aspirate well.  The port was flushed with saline followed by 100 units/mL heparinized saline.  The pocket was then closed in two layers using first subdermal inverted interrupted absorbable sutures followed by a running subcuticular suture.  The epidermis was then sealed with Dermabond. The dermatotomy at the  venous access site was also closed with a single inverted subdermal suture and the epidermis sealed with Dermabond.  Complications:  None.  The patient tolerated the procedure well.  IMPRESSION:  Successful placement of a right IJapproach PowerPort with ultrasound and fluoroscopic guidance.  The catheter is ready for use.  Signed,  Sterling Big, MD [facility]   Original Report Authenticated By: Malachy Moan, M.D.   Ir US Guide Vasc Access Right  06/03/2013   *RADIOLOGY REPORT*  TUNNELED CENTRAL VENOUS CATHETER WITH SUBCUTANEOUS RESERVOIR (PORTACATH) PLACEMENT WITH ULTRASOUND AND FLUOROSCOPIC  GUIDANCE  Date: 06/03/2013  Clinical History: 52 year old male with history of lung and tonsillar cancer.  Durable IV access required for chemotherapy.  Sedation: Moderate (conscious) sedation was administered during this procedure.  A total of threemg Versed and Fentanyl were  administered intravenously.  The patient's vital signs were monitored continuously by radiology nursing throughout the course of the procedure.  Total sedation time: 25 minutes  Fluoroscopy Time: 18 seconds  Procedure:  The right neck and chest was prepped with chlorhexidine, and draped in the usual sterile fashion using maximum barrier technique (cap and mask, sterile gown, sterile gloves, large sterile sheet, hand hygiene and cutaneous antiseptic).  Antibiotic prophylaxis was provided with 2g Ancef administered IV one hour prior to skin incision.  Local anesthesia was attained by infiltration with 1% lidocaine with epinephrine.  Ultrasound demonstrated patency of the right internal jugular vein, and this was documented with an image.  Under real-time ultrasound guidance, this vein was accessed with a 21 gauge micropuncture needle and image documentation was performed.  A small dermatotomy was made at the access site with an 11 scalpel.  A 0.018" wire was advanced into the SVC  and the access needle exchanged for a 70F micropuncture vascular sheath.  The 0.018" wire was then removed and a 0.035" wire advanced into the IVC.  An appropriate location for the subcutaneous reservoir was selected below the clavicle and an incision was made through the skin and underlying soft tissues.  The subcutaneous tissues were then dissected using a combination of blunt and sharp surgical technique and a pocket was formed.  A single lumen power injectable portacatheter was then tunneled through the subcutaneous tissues from the pocket to the dermatotomy and the port reservoir placed within the subcutaneous pocket.  The venous access site was then serially dilated and a peel away vascular sheath placed over the wire.  The wire was removed and the port catheter advanced into position under fluoroscopic guidance. The catheter tip is positioned in the superior cavoatrial junction. This was documented with a spot image. The portacatheter was then tested and found to flush and aspirate well.  The port was flushed with saline followed by 100 units/mL heparinized saline.  The pocket was then closed in two layers using first subdermal inverted interrupted absorbable sutures followed by a running subcuticular suture.  The epidermis was then sealed with Dermabond. The dermatotomy at the venous access site was also closed with a single inverted subdermal suture and the epidermis sealed with Dermabond.  Complications:  None.  The patient tolerated the procedure well.  IMPRESSION:  Successful placement of a right IJapproach PowerPort with ultrasound and fluoroscopic guidance.  The catheter is ready for use.  Signed,  Sterling Big, MD [facility]   Original Report Authenticated By: Malachy Moan, M.D.     ASSESSMENT/PLAN: Patient is a pleasant 52 year old white male recently diagnosed with extensive stage small cell lung cancer now status post one cycle  of systemic chemotherapy with carboplatin for an AUC of 5 given on day 1 and etoposide 120 mg per meter squared given on days 1, 2 and 3 with Neulasta support given on day 4. Overall he is tolerated his chemotherapy relatively well with the exception of generalized malaise. Patient was discussed with also seen by Dr. Arbutus Ped. For his poor dentition he will be evaluated by Dr. Kristin Bruins.  He'll continue with weekly labs consisting of a CBC differential and C. met. He'll followup with Dr. Arbutus Ped in 3 weeks with repeat CBC differential, C. met and CT of the chest, abdomen and pelvis with contrast to reevaluate his disease prior to cycle #3of his systemic chemotherapy with carboplatin and etoposide with Neulasta support.  Laural Benes, Sebastian Lurz E, PA-C  All questions were answered. The patient knows to call the clinic with any problems, questions or concerns. We can certainly see the patient much sooner if necessary.  ADDENDUM Hematology/Oncology Attending: I had a face to face encounter with the patient. I recommended his care plan. The patient is feeling fine today with no specific complaints. He tolerated the first cycle of his treatment was carboplatin and etoposide fairly well except for mild fatigue. He denied having any significant nausea or vomiting. He has no fever or chills. We will proceed today with cycle #2 today as scheduled. He would come back for followup visit in 3 weeks with repeat CT scan of the chest, abdomen and pelvis for restaging of his disease. He was advised to call immediately if he has any concerning symptoms in the interval. Lajuana Matte., MD 06/30/2013

## 2013-06-29 NOTE — Patient Instructions (Signed)
[facility] Discharge Instructions for Patients Receiving Chemotherapy  Today you received the following chemotherapy agent: Etoposide   To help prevent nausea and vomiting after your treatment, we encourage you to take your nausea medication as prescribed.   If you develop nausea and vomiting that is not controlled by your nausea medication, call the clinic.   BELOW ARE SYMPTOMS THAT SHOULD BE REPORTED IMMEDIATELY:  *FEVER GREATER THAN 100.5 F  *CHILLS WITH OR WITHOUT FEVER  NAUSEA AND VOMITING THAT IS NOT CONTROLLED WITH YOUR NAUSEA MEDICATION  *UNUSUAL SHORTNESS OF BREATH  *UNUSUAL BRUISING OR BLEEDING  TENDERNESS IN MOUTH AND THROAT WITH OR WITHOUT PRESENCE OF ULCERS  *URINARY PROBLEMS  *BOWEL PROBLEMS  UNUSUAL RASH Items with * indicate a potential emergency and should be followed up as soon as possible.  Feel free to call the clinic you have any questions or concerns. The clinic phone number is [phone].    

## 2013-06-29 NOTE — Telephone Encounter (Signed)
Per staff message and POF I have scheduled appts.  JMW  

## 2013-06-30 ENCOUNTER — Ambulatory Visit (HOSPITAL_BASED_OUTPATIENT_CLINIC_OR_DEPARTMENT_OTHER): Payer: Medicaid Other

## 2013-06-30 VITALS — BP 138/85 | HR 105 | Temp 97.7°F | Resp 18

## 2013-06-30 DIAGNOSIS — C3410 Malignant neoplasm of upper lobe, unspecified bronchus or lung: Secondary | ICD-10-CM

## 2013-06-30 DIAGNOSIS — C3491 Malignant neoplasm of unspecified part of right bronchus or lung: Secondary | ICD-10-CM

## 2013-06-30 DIAGNOSIS — Z5111 Encounter for antineoplastic chemotherapy: Secondary | ICD-10-CM

## 2013-06-30 DIAGNOSIS — C7931 Secondary malignant neoplasm of brain: Secondary | ICD-10-CM

## 2013-06-30 DIAGNOSIS — C787 Secondary malignant neoplasm of liver and intrahepatic bile duct: Secondary | ICD-10-CM

## 2013-06-30 MED ORDER — ONDANSETRON 8 MG/50ML IVPB (CHCC)
8.0000 mg | Freq: Once | INTRAVENOUS | Status: AC
  Administered 2013-06-30: 8 mg via INTRAVENOUS

## 2013-06-30 MED ORDER — SODIUM CHLORIDE 0.9 % IV SOLN
Freq: Once | INTRAVENOUS | Status: AC
  Administered 2013-06-30: 16:00:00 via INTRAVENOUS

## 2013-06-30 MED ORDER — SODIUM CHLORIDE 0.9 % IV SOLN
120.0000 mg/m2 | Freq: Once | INTRAVENOUS | Status: AC
  Administered 2013-06-30: 240 mg via INTRAVENOUS
  Filled 2013-06-30: qty 12

## 2013-06-30 MED ORDER — DEXAMETHASONE SODIUM PHOSPHATE 10 MG/ML IJ SOLN
10.0000 mg | Freq: Once | INTRAMUSCULAR | Status: AC
  Administered 2013-06-30: 10 mg via INTRAVENOUS

## 2013-06-30 MED ORDER — ONDANSETRON 8 MG/NS 50 ML IVPB
INTRAVENOUS | Status: AC
  Filled 2013-06-30: qty 8

## 2013-06-30 MED ORDER — SODIUM CHLORIDE 0.9 % IJ SOLN
10.0000 mL | INTRAMUSCULAR | Status: DC | PRN
  Administered 2013-06-30: 10 mL
  Filled 2013-06-30: qty 10

## 2013-06-30 MED ORDER — DEXAMETHASONE SODIUM PHOSPHATE 10 MG/ML IJ SOLN
INTRAMUSCULAR | Status: AC
  Filled 2013-06-30: qty 1

## 2013-06-30 MED ORDER — HEPARIN SOD (PORK) LOCK FLUSH 100 UNIT/ML IV SOLN
500.0000 [IU] | Freq: Once | INTRAVENOUS | Status: AC | PRN
  Administered 2013-06-30: 500 [IU]
  Filled 2013-06-30: qty 5

## 2013-06-30 NOTE — Patient Instructions (Signed)
[facility] Discharge Instructions for Patients Receiving Chemotherapy  Today you received the following chemotherapy agents:  etoposide  To help prevent nausea and vomiting after your treatment, we encourage you to take your nausea medication.  Take it as often as prescribed.     If you develop nausea and vomiting that is not controlled by your nausea medication, call the clinic. If it is after clinic hours your family physician or the after hours number for the clinic or go to the Emergency Department.   BELOW ARE SYMPTOMS THAT SHOULD BE REPORTED IMMEDIATELY:  *FEVER GREATER THAN 100.5 F  *CHILLS WITH OR WITHOUT FEVER  NAUSEA AND VOMITING THAT IS NOT CONTROLLED WITH YOUR NAUSEA MEDICATION  *UNUSUAL SHORTNESS OF BREATH  *UNUSUAL BRUISING OR BLEEDING  TENDERNESS IN MOUTH AND THROAT WITH OR WITHOUT PRESENCE OF ULCERS  *URINARY PROBLEMS  *BOWEL PROBLEMS  UNUSUAL RASH Items with * indicate a potential emergency and should be followed up as soon as possible.  Feel free to call the clinic you have any questions or concerns. The clinic phone number is [phone].   I have been informed and understand all the instructions given to me. I know to contact the clinic, my physician, or go to the Emergency Department if any problems should occur. I do not have any questions at this time, but understand that I may call the clinic during office hours   should I have any questions or need assistance in obtaining follow up care.    __________________________________________  _____________  __________ Signature of Patient or Authorized Representative            Date                   Time    __________________________________________ Nurse's Signature    

## 2013-07-01 ENCOUNTER — Ambulatory Visit (HOSPITAL_BASED_OUTPATIENT_CLINIC_OR_DEPARTMENT_OTHER): Payer: Medicaid Other

## 2013-07-01 ENCOUNTER — Encounter (INDEPENDENT_AMBULATORY_CARE_PROVIDER_SITE_OTHER): Payer: Self-pay

## 2013-07-01 VITALS — BP 126/79 | HR 101 | Temp 98.7°F

## 2013-07-01 DIAGNOSIS — C3491 Malignant neoplasm of unspecified part of right bronchus or lung: Secondary | ICD-10-CM

## 2013-07-01 DIAGNOSIS — C3410 Malignant neoplasm of upper lobe, unspecified bronchus or lung: Secondary | ICD-10-CM

## 2013-07-01 DIAGNOSIS — Z5189 Encounter for other specified aftercare: Secondary | ICD-10-CM

## 2013-07-01 MED ORDER — PEGFILGRASTIM INJECTION 6 MG/0.6ML
6.0000 mg | Freq: Once | SUBCUTANEOUS | Status: AC
  Administered 2013-07-01: 6 mg via SUBCUTANEOUS
  Filled 2013-07-01: qty 0.6

## 2013-07-05 ENCOUNTER — Other Ambulatory Visit (HOSPITAL_BASED_OUTPATIENT_CLINIC_OR_DEPARTMENT_OTHER): Payer: Medicaid Other

## 2013-07-05 DIAGNOSIS — C3410 Malignant neoplasm of upper lobe, unspecified bronchus or lung: Secondary | ICD-10-CM

## 2013-07-05 DIAGNOSIS — C3491 Malignant neoplasm of unspecified part of right bronchus or lung: Secondary | ICD-10-CM

## 2013-07-05 LAB — CBC WITH DIFFERENTIAL/PLATELET
Basophils Absolute: 0 10*3/uL (ref 0.0–0.1)
EOS%: 0 % (ref 0.0–7.0)
HCT: 31.2 % — ABNORMAL LOW (ref 38.4–49.9)
HGB: 11.3 g/dL — ABNORMAL LOW (ref 13.0–17.1)
LYMPH%: 9.6 % — ABNORMAL LOW (ref 14.0–49.0)
MCH: 33.7 pg — ABNORMAL HIGH (ref 27.2–33.4)
MCHC: 36.3 g/dL — ABNORMAL HIGH (ref 32.0–36.0)
MCV: 93.1 fL (ref 79.3–98.0)
NEUT%: 88.4 % — ABNORMAL HIGH (ref 39.0–75.0)
Platelets: 179 10*3/uL (ref 140–400)
lymph#: 0.6 10*3/uL — ABNORMAL LOW (ref 0.9–3.3)

## 2013-07-05 LAB — COMPREHENSIVE METABOLIC PANEL (CC13)
ALT: 33 U/L (ref 0–55)
AST: 28 U/L (ref 5–34)
Anion Gap: 10 mEq/L (ref 3–11)
BUN: 17.1 mg/dL (ref 7.0–26.0)
Calcium: 8.9 mg/dL (ref 8.4–10.4)
Chloride: 100 mEq/L (ref 98–109)
Creatinine: 0.8 mg/dL (ref 0.7–1.3)
Glucose: 337 mg/dl — ABNORMAL HIGH (ref 70–140)
Potassium: 3.8 mEq/L (ref 3.5–5.1)

## 2013-07-12 ENCOUNTER — Other Ambulatory Visit (HOSPITAL_BASED_OUTPATIENT_CLINIC_OR_DEPARTMENT_OTHER): Payer: Medicaid Other | Admitting: Lab

## 2013-07-12 DIAGNOSIS — C3410 Malignant neoplasm of upper lobe, unspecified bronchus or lung: Secondary | ICD-10-CM

## 2013-07-12 DIAGNOSIS — C3491 Malignant neoplasm of unspecified part of right bronchus or lung: Secondary | ICD-10-CM

## 2013-07-12 LAB — COMPREHENSIVE METABOLIC PANEL (CC13)
Albumin: 3.3 g/dL — ABNORMAL LOW (ref 3.5–5.0)
Anion Gap: 11 mEq/L (ref 3–11)
BUN: 6.8 mg/dL — ABNORMAL LOW (ref 7.0–26.0)
CO2: 24 mEq/L (ref 22–29)
Calcium: 8.5 mg/dL (ref 8.4–10.4)
Chloride: 102 mEq/L (ref 98–109)
Glucose: 229 mg/dl — ABNORMAL HIGH (ref 70–140)
Potassium: 3.6 mEq/L (ref 3.5–5.1)

## 2013-07-12 LAB — CBC WITH DIFFERENTIAL/PLATELET
Basophils Absolute: 0 10*3/uL (ref 0.0–0.1)
Eosinophils Absolute: 0 10*3/uL (ref 0.0–0.5)
HGB: 10.8 g/dL — ABNORMAL LOW (ref 13.0–17.1)
MCV: 92.8 fL (ref 79.3–98.0)
MONO#: 0.7 10*3/uL (ref 0.1–0.9)
NEUT#: 11.1 10*3/uL — ABNORMAL HIGH (ref 1.5–6.5)
RBC: 3.25 10*6/uL — ABNORMAL LOW (ref 4.20–5.82)
RDW: 13.7 % (ref 11.0–14.6)
lymph#: 1.1 10*3/uL (ref 0.9–3.3)

## 2013-07-16 ENCOUNTER — Ambulatory Visit (HOSPITAL_COMMUNITY)
Admission: RE | Admit: 2013-07-16 | Discharge: 2013-07-16 | Disposition: A | Discharge status: Home / Self Care | Payer: Medicaid Other | Source: Ambulatory Visit | Attending: Physician Assistant | Admitting: Physician Assistant

## 2013-07-16 ENCOUNTER — Encounter (HOSPITAL_COMMUNITY): Payer: Self-pay

## 2013-07-16 DIAGNOSIS — Z79899 Other long term (current) drug therapy: Secondary | ICD-10-CM | POA: Insufficient documentation

## 2013-07-16 DIAGNOSIS — C3490 Malignant neoplasm of unspecified part of unspecified bronchus or lung: Secondary | ICD-10-CM | POA: Insufficient documentation

## 2013-07-16 DIAGNOSIS — C787 Secondary malignant neoplasm of liver and intrahepatic bile duct: Secondary | ICD-10-CM | POA: Insufficient documentation

## 2013-07-16 DIAGNOSIS — D739 Disease of spleen, unspecified: Secondary | ICD-10-CM | POA: Insufficient documentation

## 2013-07-16 DIAGNOSIS — M47817 Spondylosis without myelopathy or radiculopathy, lumbosacral region: Secondary | ICD-10-CM | POA: Insufficient documentation

## 2013-07-16 DIAGNOSIS — I708 Atherosclerosis of other arteries: Secondary | ICD-10-CM | POA: Insufficient documentation

## 2013-07-16 DIAGNOSIS — I700 Atherosclerosis of aorta: Secondary | ICD-10-CM | POA: Insufficient documentation

## 2013-07-16 MED ORDER — IOHEXOL 300 MG/ML  SOLN
100.0000 mL | Freq: Once | INTRAMUSCULAR | Status: AC | PRN
  Administered 2013-07-16: 100 mL via INTRAVENOUS

## 2013-07-19 ENCOUNTER — Ambulatory Visit (HOSPITAL_BASED_OUTPATIENT_CLINIC_OR_DEPARTMENT_OTHER): Payer: Medicaid Other

## 2013-07-19 ENCOUNTER — Ambulatory Visit (HOSPITAL_BASED_OUTPATIENT_CLINIC_OR_DEPARTMENT_OTHER): Payer: Medicaid Other | Admitting: Internal Medicine

## 2013-07-19 ENCOUNTER — Encounter: Payer: Self-pay | Admitting: Internal Medicine

## 2013-07-19 ENCOUNTER — Telehealth: Payer: Self-pay | Admitting: Internal Medicine

## 2013-07-19 ENCOUNTER — Other Ambulatory Visit (HOSPITAL_BASED_OUTPATIENT_CLINIC_OR_DEPARTMENT_OTHER): Payer: Medicaid Other

## 2013-07-19 VITALS — BP 160/93 | HR 92 | Temp 97.6°F | Resp 18 | Ht 72.0 in | Wt 162.7 lb

## 2013-07-19 DIAGNOSIS — Z5111 Encounter for antineoplastic chemotherapy: Secondary | ICD-10-CM

## 2013-07-19 DIAGNOSIS — C787 Secondary malignant neoplasm of liver and intrahepatic bile duct: Secondary | ICD-10-CM

## 2013-07-19 DIAGNOSIS — C7931 Secondary malignant neoplasm of brain: Secondary | ICD-10-CM

## 2013-07-19 DIAGNOSIS — C3410 Malignant neoplasm of upper lobe, unspecified bronchus or lung: Secondary | ICD-10-CM

## 2013-07-19 DIAGNOSIS — C3491 Malignant neoplasm of unspecified part of right bronchus or lung: Secondary | ICD-10-CM

## 2013-07-19 DIAGNOSIS — C3492 Malignant neoplasm of unspecified part of left bronchus or lung: Secondary | ICD-10-CM

## 2013-07-19 LAB — CBC WITH DIFFERENTIAL/PLATELET
Basophils Absolute: 0 10*3/uL (ref 0.0–0.1)
EOS%: 0.2 % (ref 0.0–7.0)
Eosinophils Absolute: 0 10*3/uL (ref 0.0–0.5)
HCT: 32.9 % — ABNORMAL LOW (ref 38.4–49.9)
HGB: 12.1 g/dL — ABNORMAL LOW (ref 13.0–17.1)
LYMPH%: 12.9 % — ABNORMAL LOW (ref 14.0–49.0)
MCV: 89.9 fL (ref 79.3–98.0)
MONO#: 1.4 10*3/uL — ABNORMAL HIGH (ref 0.1–0.9)
MONO%: 11 % (ref 0.0–14.0)
NEUT#: 9.3 10*3/uL — ABNORMAL HIGH (ref 1.5–6.5)
Platelets: 225 10*3/uL (ref 140–400)
RBC: 3.66 10*6/uL — ABNORMAL LOW (ref 4.20–5.82)
WBC: 12.3 10*3/uL — ABNORMAL HIGH (ref 4.0–10.3)
nRBC: 1 % — ABNORMAL HIGH (ref 0–0)

## 2013-07-19 LAB — COMPREHENSIVE METABOLIC PANEL (CC13)
ALT: 16 U/L (ref 0–55)
AST: 20 U/L (ref 5–34)
Alkaline Phosphatase: 143 U/L (ref 40–150)
Anion Gap: 10 mEq/L (ref 3–11)
CO2: 24 mEq/L (ref 22–29)
Calcium: 9.2 mg/dL (ref 8.4–10.4)
Chloride: 102 mEq/L (ref 98–109)
Creatinine: 0.9 mg/dL (ref 0.7–1.3)
Glucose: 288 mg/dl — ABNORMAL HIGH (ref 70–140)
Total Bilirubin: 0.62 mg/dL (ref 0.20–1.20)
Total Protein: 6.8 g/dL (ref 6.4–8.3)

## 2013-07-19 MED ORDER — DEXAMETHASONE SODIUM PHOSPHATE 20 MG/5ML IJ SOLN
20.0000 mg | Freq: Once | INTRAMUSCULAR | Status: AC
  Administered 2013-07-19: 20 mg via INTRAVENOUS

## 2013-07-19 MED ORDER — ONDANSETRON 16 MG/50ML IVPB (CHCC)
INTRAVENOUS | Status: AC
  Filled 2013-07-19: qty 16

## 2013-07-19 MED ORDER — HEPARIN SOD (PORK) LOCK FLUSH 100 UNIT/ML IV SOLN
500.0000 [IU] | Freq: Once | INTRAVENOUS | Status: AC | PRN
  Administered 2013-07-19: 500 [IU]
  Filled 2013-07-19: qty 5

## 2013-07-19 MED ORDER — SODIUM CHLORIDE 0.9 % IV SOLN
Freq: Once | INTRAVENOUS | Status: AC
  Administered 2013-07-19: 13:00:00 via INTRAVENOUS

## 2013-07-19 MED ORDER — SODIUM CHLORIDE 0.9 % IJ SOLN
10.0000 mL | INTRAMUSCULAR | Status: DC | PRN
  Administered 2013-07-19: 10 mL
  Filled 2013-07-19: qty 10

## 2013-07-19 MED ORDER — SODIUM CHLORIDE 0.9 % IV SOLN
560.0000 mg | Freq: Once | INTRAVENOUS | Status: AC
  Administered 2013-07-19: 560 mg via INTRAVENOUS
  Filled 2013-07-19: qty 56

## 2013-07-19 MED ORDER — DEXAMETHASONE SODIUM PHOSPHATE 20 MG/5ML IJ SOLN
INTRAMUSCULAR | Status: AC
  Filled 2013-07-19: qty 5

## 2013-07-19 MED ORDER — SODIUM CHLORIDE 0.9 % IV SOLN
100.0000 mg/m2 | Freq: Once | INTRAVENOUS | Status: AC
  Administered 2013-07-19: 200 mg via INTRAVENOUS
  Filled 2013-07-19: qty 10

## 2013-07-19 MED ORDER — ONDANSETRON 16 MG/50ML IVPB (CHCC)
16.0000 mg | Freq: Once | INTRAVENOUS | Status: AC
  Administered 2013-07-19: 16 mg via INTRAVENOUS

## 2013-07-19 NOTE — Patient Instructions (Signed)
[facility] Discharge Instructions for Patients Receiving Chemotherapy  Today you received the following chemotherapy agents:  Carboplatin and Etoposide.  To help prevent nausea and vomiting after your treatment, we encourage you to take your nausea medication as ordered per MD.   If you develop nausea and vomiting that is not controlled by your nausea medication, call the clinic.   BELOW ARE SYMPTOMS THAT SHOULD BE REPORTED IMMEDIATELY:  *FEVER GREATER THAN 100.5 F  *CHILLS WITH OR WITHOUT FEVER  NAUSEA AND VOMITING THAT IS NOT CONTROLLED WITH YOUR NAUSEA MEDICATION  *UNUSUAL SHORTNESS OF BREATH  *UNUSUAL BRUISING OR BLEEDING  TENDERNESS IN MOUTH AND THROAT WITH OR WITHOUT PRESENCE OF ULCERS  *URINARY PROBLEMS  *BOWEL PROBLEMS  UNUSUAL RASH Items with * indicate a potential emergency and should be followed up as soon as possible.  Feel free to call the clinic you have any questions or concerns. The clinic phone number is [phone].    

## 2013-07-19 NOTE — Telephone Encounter (Signed)
gv and printed papt sched and avs for pt for NOV and DEC....sed added tx.

## 2013-07-19 NOTE — Progress Notes (Signed)
[facility]  [phone] [phone]  PROGRESS NOTE  Saralyn Pilar, DO [address] [place] [address]  DIAGNOSIS: Extensive Stage small cell lung cancer diagnosed in September of 2014.  PRIOR THERAPY: None  CURRENT THERAPY: Systemic chemotherapy with carboplatin for an AUC of 5 given on day 1 and etoposide 120 mg per meter squared given on days 1, 2 and 3 with Neulasta support given on day 4 status post 2 cycles. Carboplatin was reduced to AUC of 4 and etoposide 100 mg/M2 starting from cycle #3.  DISEASE STAGE: Extensive stage  CHEMOTHERAPY INTENT: Palliative  CURRENT # OF CHEMOTHERAPY CYCLES: 2  CURRENT ANTIEMETICS: Zofran, dexamethasone, Compazine  CURRENT SMOKING STATUS: Current smoker, strongly advised to quit smoking  ORAL CHEMOTHERAPY AND CONSENT: N./A.  CURRENT BISPHOSPHONATES USE: None  PAIN MANAGEMENT: Percocet  NARCOTICS INDUCED CONSTIPATION: None  LIVING WILL AND CODE STATUS: ?   INTERVAL HISTORY: William Townsend 52 y.o. male returns for a follow up visit for followup of his recently diagnosed extensive stage small cell lung cancer. The patient is tolerating his current treatment was carboplatin and etoposide fairly well with no significant adverse effects except for mild fatigue. He denied having any significant chest pain, shortness breath, cough or hemoptysis. The patient has no nausea or vomiting. He has no fever or chills. He has repeat CT scan of the chest, abdomen and pelvis performed recently and he is here for evaluation and discussion of his scan results.  MEDICAL HISTORY: Past Medical History  Diagnosis Date  . Atrial fibrillation   . Diabetes mellitus   . 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 Allergy   . Substance abuse   . Depression   . Stroke     3 previous strokes - Oct, Nov, and Dec 2012  . Throat cancer     September 2008  . Lung cancer     lobectomy in 2010  . Heart murmur      ALLERGIES:  is allergic to vicodin.  MEDICATIONS:  Current Outpatient Prescriptions  Medication Sig Dispense Refill  . lidocaine-prilocaine (EMLA) cream Apply topically as needed. Apply to port 1 hour before chemotherapy.  30 g  0  . loratadine (CLARITIN) 10 MG tablet Take 1 tablet (10 mg total) by mouth daily.  30 tablet  11  . oxyCODONE-acetaminophen (PERCOCET/ROXICET) 5-325 MG per tablet Take 1-2 tablets by mouth every 6 (six) hours as needed.  90 tablet  0  . prochlorperazine (COMPAZINE) 10 MG tablet Take 1 tablet (10 mg total) by mouth every 6 (six) hours as needed.  60 tablet  0   No current facility-administered medications for this visit.    SURGICAL HISTORY:  Past Surgical History  Procedure Laterality Date  . Lobectomy    . Craniotomy Left 04/27/2013    Procedure: Left Occipital Craniotomy for resection of tumor with microscope;  Surgeon: Karn Cassis, MD;  Location: [facility];  Service: Neurosurgery;  Laterality: Left;    REVIEW OF SYSTEMS:  Constitutional: positive for fatigue Eyes: negative Ears, nose, mouth, throat, and face: negative Respiratory: negative Cardiovascular: negative Gastrointestinal: negative Genitourinary:negative Integument/breast: negative Hematologic/lymphatic: negative Musculoskeletal:negative Neurological: negative Behavioral/Psych: negative Endocrine: negative Allergic/Immunologic: negative   PHYSICAL EXAMINATION: General appearance: alert, cooperative and no distress Head: Normocephalic, without obvious abnormality, atraumatic Neck: no adenopathy, no carotid bruit, no JVD, supple, symmetrical, trachea midline and thyroid not enlarged, symmetric, no tenderness/mass/nodules Lymph nodes: Cervical, supraclavicular, and axillary nodes normal. Resp: clear to  auscultation bilaterally Cardio: regular rate and rhythm, S1, S2 normal, no murmur, click, rub or gallop GI: soft, non-tender; bowel sounds normal; no masses,  no  organomegaly Extremities: extremities normal, atraumatic, no cyanosis or edema Neurologic: Alert and oriented X 3, normal strength and tone. Normal symmetric reflexes. Normal coordination and gait Mouth: Reveals a poor dentition with  6 remaining teeth. In the left upper posterior gum line there is a tooth with jagged edges The tongue and buccal mucosa appear dry, no evidence of thrush or mucositis  ECOG PERFORMANCE STATUS: 1 - Symptomatic but completely ambulatory  There were no vitals taken for this visit.  LABORATORY DATA: Lab Results  Component Value Date   WBC 13.0* 07/12/2013   HGB 10.8* 07/12/2013   HCT 30.1* 07/12/2013   MCV 92.8 07/12/2013   PLT 22* 07/12/2013      Chemistry      Component Value Date/Time   NA 137 07/12/2013 1314   NA 136 06/03/2013 1310   K 3.6 07/12/2013 1314   K 3.8 06/03/2013 1310   CL 98 06/03/2013 1310   CO2 24 07/12/2013 1314   CO2 25 06/03/2013 1310   BUN 6.8* 07/12/2013 1314   BUN 12 06/03/2013 1310   CREATININE 0.8 07/12/2013 1314   CREATININE 0.91 06/03/2013 1310   CREATININE 1.04 01/28/2012 1454      Component Value Date/Time   CALCIUM 8.5 07/12/2013 1314   CALCIUM 9.0 06/03/2013 1310   ALKPHOS 139 07/12/2013 1314   ALKPHOS 122* 04/21/2013 1041   AST 27 07/12/2013 1314   AST 29 04/21/2013 1041   ALT 21 07/12/2013 1314   ALT 28 04/21/2013 1041   BILITOT 0.52 07/12/2013 1314   BILITOT 0.7 04/21/2013 1041       RADIOGRAPHIC STUDIES:  Ct Chest W Contrast  07/16/2013   CLINICAL DATA:  Restaging small-cell lung cancer. Intracranial and hepatic metastases. Chemotherapy ongoing.  EXAM: CT CHEST, ABDOMEN, AND PELVIS WITH CONTRAST  TECHNIQUE: Multidetector CT imaging of the chest, abdomen and pelvis was performed following the standard protocol during bolus administration of intravenous contrast.  CONTRAST:  OMNIPAQUE IOHEXOL 300 MG/ML  SOLN  COMPARISON:  Prior examinations 06/02/2013 and 04/21/2013.  FINDINGS:   CT CHEST FINDINGS  Right IJ Port-A-Cath  tip extends to the SVC right atrial junction. Small mediastinal and paraesophageal lymph nodes are unchanged. There are no pathologically enlarged mediastinal, hilar or axillary lymph nodes.  There is stable atherosclerosis of the aorta, great vessels and coronary arteries. No significant pleural or pericardial effusion is present.  There are stable postsurgical changes in the right upper lobe. The cavitary left upper lobe mass measures 3.3 x 2.2 cm on image 22 (previously 3.5 x 2.8 cm). No new or enlarging nodules are identified. There are no worrisome osseous findings.    CT ABDOMEN AND PELVIS FINDINGS  Interval improvement in multifocal hepatic metastatic disease. The largest lesion posteriorly in the right hepatic lobe measures 5.3 x 4.4 cm on image 53. Lesion inferiorly in the right hepatic lobe measures 4.8 x 3.9 cm on image 66 (previously 5.8 x 5.4 cm).  The inferior splenic lesion also slightly smaller, measuring 4.1 x 3.6 cm on image 74 (previously 4.6 x 3.3 cm). No new splenic lesions are identified.  There is no adrenal mass. The gallbladder is contracted without focal abnormality. Probable gallstones are again noted. The pancreas and kidneys appear unremarkable.  No significant abnormalities of the stomach, small bowel, appendix or colon are seen. There is  stable aortoiliac atherosclerosis. There is no adenopathy or ascites. Mild bladder wall thickening is noted. There are calcifications within the base of the penis.  No worrisome osseous findings are demonstrated. Vague lucent lesion in the left iliac bone on image 100 is unchanged. Diffuse lumbar spondylosis noted.  IMPRESSION: 1. Slight interval decrease in size of cavitary left upper lobe lung mass consistent with primary lung cancer. 2. No evidence of thoracic metastatic disease. 3. Interval improvement in multifocal hepatic metastatic disease. The indeterminate splenic lesion has also slightly decreased in size, suggesting that this may reflect  an atypical metastasis. No evidence of disease progression.   Electronically Signed   By: Roxy Horseman M.D.   On: 07/16/2013 12:46    ASSESSMENT/PLAN:  The patient is feeling fine today with no specific complaints. He tolerated the first 2 cycles of his treatment with carboplatin and etoposide fairly well except for mild fatigue.  His recent scan showed improvement in his disease. I discussed the scan results with the patient and recommended for him to continue his chemotherapy with carboplatin and etoposide, but I would reduce the dose of carboplatin to AUC of 4 and etoposide to 100 mg/M2. We will proceed with cycle #3 today as scheduled.   He was advised to call immediately if he has any concerning symptoms in the interval.  All questions were answered. The patient knows to call the clinic with any problems, questions or concerns. We can certainly see the patient much sooner if necessary.  I spent 15 minutes counseling the patient face to face. The total time spent in the appointment was 25 minutes.  Lajuana Matte., MD 07/19/2013

## 2013-07-19 NOTE — Patient Instructions (Signed)
The scan showed improvement in your disease. Continue systemic chemotherapy with carboplatin and etoposide. Followup visit in 3 weeks

## 2013-07-20 ENCOUNTER — Ambulatory Visit: Payer: Self-pay | Admitting: Family Medicine

## 2013-07-20 ENCOUNTER — Ambulatory Visit (HOSPITAL_BASED_OUTPATIENT_CLINIC_OR_DEPARTMENT_OTHER): Payer: Medicaid Other

## 2013-07-20 VITALS — BP 143/86 | HR 89 | Temp 97.0°F | Resp 18

## 2013-07-20 DIAGNOSIS — C3410 Malignant neoplasm of upper lobe, unspecified bronchus or lung: Secondary | ICD-10-CM

## 2013-07-20 DIAGNOSIS — C3491 Malignant neoplasm of unspecified part of right bronchus or lung: Secondary | ICD-10-CM

## 2013-07-20 DIAGNOSIS — Z5111 Encounter for antineoplastic chemotherapy: Secondary | ICD-10-CM

## 2013-07-20 DIAGNOSIS — C787 Secondary malignant neoplasm of liver and intrahepatic bile duct: Secondary | ICD-10-CM

## 2013-07-20 DIAGNOSIS — C7931 Secondary malignant neoplasm of brain: Secondary | ICD-10-CM

## 2013-07-20 MED ORDER — ONDANSETRON 8 MG/NS 50 ML IVPB
INTRAVENOUS | Status: AC
  Filled 2013-07-20: qty 8

## 2013-07-20 MED ORDER — SODIUM CHLORIDE 0.9 % IJ SOLN
10.0000 mL | INTRAMUSCULAR | Status: DC | PRN
  Administered 2013-07-20: 10 mL
  Filled 2013-07-20: qty 10

## 2013-07-20 MED ORDER — HEPARIN SOD (PORK) LOCK FLUSH 100 UNIT/ML IV SOLN
500.0000 [IU] | Freq: Once | INTRAVENOUS | Status: AC | PRN
  Administered 2013-07-20: 500 [IU]
  Filled 2013-07-20: qty 5

## 2013-07-20 MED ORDER — SODIUM CHLORIDE 0.9 % IV SOLN
Freq: Once | INTRAVENOUS | Status: AC
  Administered 2013-07-20: 12:00:00 via INTRAVENOUS

## 2013-07-20 MED ORDER — DEXAMETHASONE SODIUM PHOSPHATE 10 MG/ML IJ SOLN
INTRAMUSCULAR | Status: AC
  Filled 2013-07-20: qty 1

## 2013-07-20 MED ORDER — ONDANSETRON 8 MG/50ML IVPB (CHCC)
8.0000 mg | Freq: Once | INTRAVENOUS | Status: AC
  Administered 2013-07-20: 8 mg via INTRAVENOUS

## 2013-07-20 MED ORDER — DEXAMETHASONE SODIUM PHOSPHATE 10 MG/ML IJ SOLN
10.0000 mg | Freq: Once | INTRAMUSCULAR | Status: AC
  Administered 2013-07-20: 10 mg via INTRAVENOUS

## 2013-07-20 MED ORDER — SODIUM CHLORIDE 0.9 % IV SOLN
100.0000 mg/m2 | Freq: Once | INTRAVENOUS | Status: AC
  Administered 2013-07-20: 200 mg via INTRAVENOUS
  Filled 2013-07-20: qty 10

## 2013-07-20 NOTE — Patient Instructions (Signed)
[facility] Discharge Instructions for Patients Receiving Chemotherapy  Today you received the following chemotherapy agents Etoposide.   To help prevent nausea and vomiting after your treatment, we encourage you to take your nausea medication as directed.    If you develop nausea and vomiting that is not controlled by your nausea medication, call the clinic.   BELOW ARE SYMPTOMS THAT SHOULD BE REPORTED IMMEDIATELY:  *FEVER GREATER THAN 100.5 F  *CHILLS WITH OR WITHOUT FEVER  NAUSEA AND VOMITING THAT IS NOT CONTROLLED WITH YOUR NAUSEA MEDICATION  *UNUSUAL SHORTNESS OF BREATH  *UNUSUAL BRUISING OR BLEEDING  TENDERNESS IN MOUTH AND THROAT WITH OR WITHOUT PRESENCE OF ULCERS  *URINARY PROBLEMS  *BOWEL PROBLEMS  UNUSUAL RASH Items with * indicate a potential emergency and should be followed up as soon as possible.  Feel free to call the clinic you have any questions or concerns. The clinic phone number is [phone].    

## 2013-07-21 ENCOUNTER — Ambulatory Visit (HOSPITAL_BASED_OUTPATIENT_CLINIC_OR_DEPARTMENT_OTHER): Payer: Medicaid Other

## 2013-07-21 ENCOUNTER — Other Ambulatory Visit: Payer: Self-pay | Admitting: *Deleted

## 2013-07-21 VITALS — BP 154/92 | HR 98 | Temp 98.4°F | Resp 18

## 2013-07-21 DIAGNOSIS — C3410 Malignant neoplasm of upper lobe, unspecified bronchus or lung: Secondary | ICD-10-CM

## 2013-07-21 DIAGNOSIS — C3491 Malignant neoplasm of unspecified part of right bronchus or lung: Secondary | ICD-10-CM

## 2013-07-21 DIAGNOSIS — C787 Secondary malignant neoplasm of liver and intrahepatic bile duct: Secondary | ICD-10-CM

## 2013-07-21 DIAGNOSIS — J309 Allergic rhinitis, unspecified: Secondary | ICD-10-CM

## 2013-07-21 DIAGNOSIS — Z5111 Encounter for antineoplastic chemotherapy: Secondary | ICD-10-CM

## 2013-07-21 MED ORDER — DEXAMETHASONE SODIUM PHOSPHATE 10 MG/ML IJ SOLN
INTRAMUSCULAR | Status: AC
Start: 2013-07-21 — End: 2013-07-21
  Filled 2013-07-21: qty 1

## 2013-07-21 MED ORDER — SODIUM CHLORIDE 0.9 % IV SOLN
Freq: Once | INTRAVENOUS | Status: AC
  Administered 2013-07-21: 13:00:00 via INTRAVENOUS

## 2013-07-21 MED ORDER — DEXAMETHASONE SODIUM PHOSPHATE 10 MG/ML IJ SOLN
10.0000 mg | Freq: Once | INTRAMUSCULAR | Status: AC
  Administered 2013-07-21: 10 mg via INTRAVENOUS

## 2013-07-21 MED ORDER — ONDANSETRON 8 MG/50ML IVPB (CHCC)
8.0000 mg | Freq: Once | INTRAVENOUS | Status: AC
  Administered 2013-07-21: 8 mg via INTRAVENOUS

## 2013-07-21 MED ORDER — SODIUM CHLORIDE 0.9 % IJ SOLN
10.0000 mL | INTRAMUSCULAR | Status: DC | PRN
  Administered 2013-07-21: 10 mL
  Filled 2013-07-21: qty 10

## 2013-07-21 MED ORDER — LORATADINE 10 MG PO TABS: 10.0000 mg | ORAL_TABLET | Freq: Every day | ORAL | Status: DC | PRN

## 2013-07-21 MED ORDER — SODIUM CHLORIDE 0.9 % IV SOLN
100.0000 mg/m2 | Freq: Once | INTRAVENOUS | Status: AC
  Administered 2013-07-21: 200 mg via INTRAVENOUS
  Filled 2013-07-21: qty 10

## 2013-07-21 MED ORDER — ONDANSETRON HCL 8 MG PO TABS
ORAL_TABLET | ORAL | Status: AC
  Filled 2013-07-21: qty 1

## 2013-07-21 MED ORDER — HEPARIN SOD (PORK) LOCK FLUSH 100 UNIT/ML IV SOLN
500.0000 [IU] | Freq: Once | INTRAVENOUS | Status: AC | PRN
  Administered 2013-07-21: 500 [IU]
  Filled 2013-07-21: qty 5

## 2013-07-21 MED ORDER — ONDANSETRON 8 MG/NS 50 ML IVPB
INTRAVENOUS | Status: AC
  Filled 2013-07-21: qty 8

## 2013-07-21 MED ORDER — OXYCODONE-ACETAMINOPHEN 5-325 MG PO TABS: 1.0000 | ORAL_TABLET | Freq: Four times a day (QID) | ORAL | Status: DC | PRN

## 2013-07-21 NOTE — Patient Instructions (Signed)
[facility] Discharge Instructions for Patients Receiving Chemotherapy  Today you received the following chemotherapy agents VP 16 (Etoposide) To help prevent nausea and vomiting after your treatment, we encourage you to take your nausea medication as prescribed.   If you develop nausea and vomiting that is not controlled by your nausea medication, call the clinic.   BELOW ARE SYMPTOMS THAT SHOULD BE REPORTED IMMEDIATELY:  *FEVER GREATER THAN 100.5 F  *CHILLS WITH OR WITHOUT FEVER  NAUSEA AND VOMITING THAT IS NOT CONTROLLED WITH YOUR NAUSEA MEDICATION  *UNUSUAL SHORTNESS OF BREATH  *UNUSUAL BRUISING OR BLEEDING  TENDERNESS IN MOUTH AND THROAT WITH OR WITHOUT PRESENCE OF ULCERS  *URINARY PROBLEMS  *BOWEL PROBLEMS  UNUSUAL RASH Items with * indicate a potential emergency and should be followed up as soon as possible.  Feel free to call the clinic you have any questions or concerns. The clinic phone number is [phone].    

## 2013-07-21 NOTE — Telephone Encounter (Signed)
Pt in the infusion room requesting a rx refill on his percocet and clartian.  He has funds through [facility] that he does not have through his PCP that prescribes these medications.  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  He is waiting for social security and then medicaid to kick in and then his medications will be covered.  He wanted to see if Dr Donnald Garre would refill his rx's.  Per Dr Donnald Garre, will give a 1 time rx refill of these medications percocet and clartian.  SLJ

## 2013-07-22 ENCOUNTER — Ambulatory Visit (HOSPITAL_BASED_OUTPATIENT_CLINIC_OR_DEPARTMENT_OTHER): Payer: Medicaid Other

## 2013-07-22 VITALS — BP 126/71 | HR 95 | Temp 97.5°F

## 2013-07-22 DIAGNOSIS — C3491 Malignant neoplasm of unspecified part of right bronchus or lung: Secondary | ICD-10-CM

## 2013-07-22 DIAGNOSIS — Z5189 Encounter for other specified aftercare: Secondary | ICD-10-CM

## 2013-07-22 DIAGNOSIS — C3410 Malignant neoplasm of upper lobe, unspecified bronchus or lung: Secondary | ICD-10-CM

## 2013-07-22 DIAGNOSIS — C787 Secondary malignant neoplasm of liver and intrahepatic bile duct: Secondary | ICD-10-CM

## 2013-07-22 MED ORDER — PEGFILGRASTIM INJECTION 6 MG/0.6ML
6.0000 mg | Freq: Once | SUBCUTANEOUS | Status: AC
  Administered 2013-07-22: 6 mg via SUBCUTANEOUS
  Filled 2013-07-22: qty 0.6

## 2013-07-22 NOTE — Patient Instructions (Signed)

## 2013-07-26 ENCOUNTER — Other Ambulatory Visit (HOSPITAL_BASED_OUTPATIENT_CLINIC_OR_DEPARTMENT_OTHER): Payer: Medicaid Other

## 2013-07-26 DIAGNOSIS — C3410 Malignant neoplasm of upper lobe, unspecified bronchus or lung: Secondary | ICD-10-CM

## 2013-07-26 DIAGNOSIS — C3491 Malignant neoplasm of unspecified part of right bronchus or lung: Secondary | ICD-10-CM

## 2013-07-26 LAB — CBC WITH DIFFERENTIAL/PLATELET
BASO%: 0.2 % (ref 0.0–2.0)
EOS%: 0.1 % (ref 0.0–7.0)
Eosinophils Absolute: 0 10*3/uL (ref 0.0–0.5)
HCT: 27.6 % — ABNORMAL LOW (ref 38.4–49.9)
LYMPH%: 6.7 % — ABNORMAL LOW (ref 14.0–49.0)
MCH: 33.9 pg — ABNORMAL HIGH (ref 27.2–33.4)
MCHC: 35.9 g/dL (ref 32.0–36.0)
MCV: 94.3 fL (ref 79.3–98.0)
MONO#: 0.2 10*3/uL (ref 0.1–0.9)
NEUT#: 9.3 10*3/uL — ABNORMAL HIGH (ref 1.5–6.5)
RDW: 16 % — ABNORMAL HIGH (ref 11.0–14.6)
WBC: 10.2 10*3/uL (ref 4.0–10.3)
lymph#: 0.7 10*3/uL — ABNORMAL LOW (ref 0.9–3.3)

## 2013-07-26 LAB — COMPREHENSIVE METABOLIC PANEL (CC13)
ALT: 23 U/L (ref 0–55)
AST: 21 U/L (ref 5–34)
Albumin: 4 g/dL (ref 3.5–5.0)
Anion Gap: 12 mEq/L — ABNORMAL HIGH (ref 3–11)
CO2: 23 mEq/L (ref 22–29)
Calcium: 9.5 mg/dL (ref 8.4–10.4)
Chloride: 98 mEq/L (ref 98–109)
Potassium: 3.6 mEq/L (ref 3.5–5.1)
Sodium: 133 mEq/L — ABNORMAL LOW (ref 136–145)
Total Protein: 7 g/dL (ref 6.4–8.3)

## 2013-07-29 ENCOUNTER — Ambulatory Visit: Payer: Medicaid Other | Admitting: Radiation Oncology

## 2013-08-02 ENCOUNTER — Ambulatory Visit: Payer: Self-pay | Admitting: Family Medicine

## 2013-08-02 ENCOUNTER — Other Ambulatory Visit: Payer: Self-pay

## 2013-08-02 ENCOUNTER — Telehealth: Payer: Self-pay | Admitting: Internal Medicine

## 2013-08-02 NOTE — Telephone Encounter (Signed)
returned pt call and r/s todays appt to tomorrow due to transportation...done

## 2013-08-03 ENCOUNTER — Other Ambulatory Visit (HOSPITAL_BASED_OUTPATIENT_CLINIC_OR_DEPARTMENT_OTHER): Payer: Medicaid Other

## 2013-08-03 DIAGNOSIS — C3491 Malignant neoplasm of unspecified part of right bronchus or lung: Secondary | ICD-10-CM

## 2013-08-03 DIAGNOSIS — C3490 Malignant neoplasm of unspecified part of unspecified bronchus or lung: Secondary | ICD-10-CM

## 2013-08-03 LAB — CBC WITH DIFFERENTIAL/PLATELET
BASO%: 0.7 % (ref 0.0–2.0)
Basophils Absolute: 0 10*3/uL (ref 0.0–0.1)
EOS%: 0.3 % (ref 0.0–7.0)
HGB: 10.6 g/dL — ABNORMAL LOW (ref 13.0–17.1)
LYMPH%: 17.7 % (ref 14.0–49.0)
MCH: 34.7 pg — ABNORMAL HIGH (ref 27.2–33.4)
MCHC: 34.9 g/dL (ref 32.0–36.0)
MCV: 99.4 fL — ABNORMAL HIGH (ref 79.3–98.0)
MONO%: 9.9 % (ref 0.0–14.0)
Platelets: 63 10*3/uL — ABNORMAL LOW (ref 140–400)
RBC: 3.04 10*6/uL — ABNORMAL LOW (ref 4.20–5.82)
RDW: 19.3 % — ABNORMAL HIGH (ref 11.0–14.6)

## 2013-08-03 LAB — COMPREHENSIVE METABOLIC PANEL (CC13)
ALT: 29 U/L (ref 0–55)
AST: 25 U/L (ref 5–34)
Albumin: 3.8 g/dL (ref 3.5–5.0)
Alkaline Phosphatase: 135 U/L (ref 40–150)
BUN: 7.1 mg/dL (ref 7.0–26.0)
Chloride: 100 mEq/L (ref 98–109)
Potassium: 3.6 mEq/L (ref 3.5–5.1)

## 2013-08-09 ENCOUNTER — Ambulatory Visit (HOSPITAL_BASED_OUTPATIENT_CLINIC_OR_DEPARTMENT_OTHER): Payer: Medicaid Other | Admitting: Physician Assistant

## 2013-08-09 ENCOUNTER — Telehealth: Payer: Self-pay | Admitting: *Deleted

## 2013-08-09 ENCOUNTER — Ambulatory Visit (HOSPITAL_BASED_OUTPATIENT_CLINIC_OR_DEPARTMENT_OTHER): Payer: Medicaid Other

## 2013-08-09 ENCOUNTER — Encounter: Payer: Self-pay | Admitting: Physician Assistant

## 2013-08-09 ENCOUNTER — Other Ambulatory Visit (HOSPITAL_BASED_OUTPATIENT_CLINIC_OR_DEPARTMENT_OTHER): Payer: Medicaid Other | Admitting: Lab

## 2013-08-09 VITALS — BP 133/84 | HR 74 | Temp 97.0°F | Resp 18 | Ht 72.0 in | Wt 156.5 lb

## 2013-08-09 DIAGNOSIS — C3410 Malignant neoplasm of upper lobe, unspecified bronchus or lung: Secondary | ICD-10-CM

## 2013-08-09 DIAGNOSIS — C3491 Malignant neoplasm of unspecified part of right bronchus or lung: Secondary | ICD-10-CM

## 2013-08-09 DIAGNOSIS — C3490 Malignant neoplasm of unspecified part of unspecified bronchus or lung: Secondary | ICD-10-CM

## 2013-08-09 DIAGNOSIS — R5381 Other malaise: Secondary | ICD-10-CM

## 2013-08-09 DIAGNOSIS — C7931 Secondary malignant neoplasm of brain: Secondary | ICD-10-CM

## 2013-08-09 DIAGNOSIS — C787 Secondary malignant neoplasm of liver and intrahepatic bile duct: Secondary | ICD-10-CM

## 2013-08-09 DIAGNOSIS — Z5111 Encounter for antineoplastic chemotherapy: Secondary | ICD-10-CM

## 2013-08-09 LAB — CBC WITH DIFFERENTIAL/PLATELET
Basophils Absolute: 0 10*3/uL (ref 0.0–0.1)
Eosinophils Absolute: 0 10*3/uL (ref 0.0–0.5)
HGB: 10.9 g/dL — ABNORMAL LOW (ref 13.0–17.1)
LYMPH%: 14.1 % (ref 14.0–49.0)
MCV: 96 fL (ref 79.3–98.0)
MONO#: 0.5 10*3/uL (ref 0.1–0.9)
MONO%: 6.6 % (ref 0.0–14.0)
NEUT#: 6.5 10*3/uL (ref 1.5–6.5)
NEUT%: 79.1 % — ABNORMAL HIGH (ref 39.0–75.0)
Platelets: 195 10*3/uL (ref 140–400)
RBC: 3.26 10*6/uL — ABNORMAL LOW (ref 4.20–5.82)
WBC: 8.2 10*3/uL (ref 4.0–10.3)

## 2013-08-09 LAB — COMPREHENSIVE METABOLIC PANEL (CC13)
Anion Gap: 10 mEq/L (ref 3–11)
BUN: 10.1 mg/dL (ref 7.0–26.0)
CO2: 27 mEq/L (ref 22–29)
Creatinine: 0.9 mg/dL (ref 0.7–1.3)
Glucose: 250 mg/dl — ABNORMAL HIGH (ref 70–140)
Total Bilirubin: 0.78 mg/dL (ref 0.20–1.20)

## 2013-08-09 MED ORDER — SODIUM CHLORIDE 0.9 % IV SOLN
100.0000 mg/m2 | Freq: Once | INTRAVENOUS | Status: AC
  Administered 2013-08-09: 200 mg via INTRAVENOUS
  Filled 2013-08-09: qty 10

## 2013-08-09 MED ORDER — DEXAMETHASONE SODIUM PHOSPHATE 20 MG/5ML IJ SOLN
INTRAMUSCULAR | Status: AC
  Filled 2013-08-09: qty 5

## 2013-08-09 MED ORDER — ONDANSETRON 16 MG/50ML IVPB (CHCC)
16.0000 mg | Freq: Once | INTRAVENOUS | Status: AC
  Administered 2013-08-09: 16 mg via INTRAVENOUS

## 2013-08-09 MED ORDER — SODIUM CHLORIDE 0.9 % IV SOLN
Freq: Once | INTRAVENOUS | Status: AC
  Administered 2013-08-09: 13:00:00 via INTRAVENOUS

## 2013-08-09 MED ORDER — HEPARIN SOD (PORK) LOCK FLUSH 100 UNIT/ML IV SOLN
500.0000 [IU] | Freq: Once | INTRAVENOUS | Status: AC | PRN
  Administered 2013-08-09: 500 [IU]
  Filled 2013-08-09: qty 5

## 2013-08-09 MED ORDER — SODIUM CHLORIDE 0.9 % IV SOLN
560.0000 mg | Freq: Once | INTRAVENOUS | Status: AC
  Administered 2013-08-09: 560 mg via INTRAVENOUS
  Filled 2013-08-09: qty 56

## 2013-08-09 MED ORDER — ONDANSETRON 16 MG/50ML IVPB (CHCC)
INTRAVENOUS | Status: AC
  Filled 2013-08-09: qty 16

## 2013-08-09 MED ORDER — DEXAMETHASONE SODIUM PHOSPHATE 20 MG/5ML IJ SOLN
20.0000 mg | Freq: Once | INTRAMUSCULAR | Status: AC
  Administered 2013-08-09: 20 mg via INTRAVENOUS

## 2013-08-09 MED ORDER — SODIUM CHLORIDE 0.9 % IJ SOLN
10.0000 mL | INTRAMUSCULAR | Status: DC | PRN
  Administered 2013-08-09: 10 mL
  Filled 2013-08-09: qty 10

## 2013-08-09 NOTE — Patient Instructions (Signed)
[facility] Discharge Instructions for Patients Receiving Chemotherapy  Today you received the following chemotherapy agents Etoposide, Carboplatin  To help prevent nausea and vomiting after your treatment, we encourage you to take your nausea medication     If you develop nausea and vomiting that is not controlled by your nausea medication, call the clinic.   BELOW ARE SYMPTOMS THAT SHOULD BE REPORTED IMMEDIATELY:  *FEVER GREATER THAN 100.5 F  *CHILLS WITH OR WITHOUT FEVER  NAUSEA AND VOMITING THAT IS NOT CONTROLLED WITH YOUR NAUSEA MEDICATION  *UNUSUAL SHORTNESS OF BREATH  *UNUSUAL BRUISING OR BLEEDING  TENDERNESS IN MOUTH AND THROAT WITH OR WITHOUT PRESENCE OF ULCERS  *URINARY PROBLEMS  *BOWEL PROBLEMS  UNUSUAL RASH Items with * indicate a potential emergency and should be followed up as soon as possible.  Feel free to call the clinic you have any questions or concerns. The clinic phone number is [phone].    

## 2013-08-09 NOTE — Telephone Encounter (Signed)
appts made and printed. Pt is aware that tx will be added. i emailed MW...td

## 2013-08-09 NOTE — Telephone Encounter (Signed)
Per staff message and POF I have scheduled appts.  JMW  

## 2013-08-09 NOTE — Progress Notes (Addendum)
[facility]  Telephone:([phone]) [phone] [phone]) [phone]  SHARED VISIT PROGRESS NOTE  Saralyn Pilar, DO [address] [place] [address]  DIAGNOSIS: Extensive Stage small cell lung cancer diagnosed in September of 2014.  PRIOR THERAPY: None  CURRENT THERAPY: Systemic chemotherapy with carboplatin for an AUC of 5 given on day 1 and etoposide 120 mg per meter squared given on days 1, 2 and 3 with Neulasta support given on day 4 status post 3 cycles. Carboplatin was reduced to AUC of 4 and etoposide 100 mg/M2 starting from cycle #3.  DISEASE STAGE: Extensive stage  CHEMOTHERAPY INTENT: Palliative  CURRENT # OF CHEMOTHERAPY CYCLES: 4  CURRENT ANTIEMETICS: Zofran, dexamethasone, Compazine  CURRENT SMOKING STATUS: Current smoker, strongly advised to quit smoking  ORAL CHEMOTHERAPY AND CONSENT: N./A.  CURRENT BISPHOSPHONATES USE: None  PAIN MANAGEMENT: Percocet  NARCOTICS INDUCED CONSTIPATION: None  LIVING WILL AND CODE STATUS: ?   INTERVAL HISTORY: William Townsend 52 y.o. male returns for a follow up visit for followup of his recently diagnosed extensive stage small cell lung cancer. The patient is tolerating his current treatment was carboplatin and etoposide fairly well with no significant adverse effects except for fatigue. He notes the fatigue drugs on for about 10 days after chemotherapy. He also has constipation but this is well managed with Dulcolax. He voiced no other specific complaints He denied having any significant chest pain, shortness breath, cough or hemoptysis. The patient has no nausea or vomiting. He has no fever or chills.   MEDICAL HISTORY: Past Medical History  Diagnosis Date  . Atrial fibrillation   . Diabetes mellitus   . 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 Allergy   . Substance abuse   . Depression   . Stroke     3 previous strokes - Oct, Nov, and Dec 2012  . Throat cancer     September 2008  . Lung cancer      lobectomy in 2010  . Heart murmur     ALLERGIES:  is allergic to vicodin.  MEDICATIONS:  Current Outpatient Prescriptions  Medication Sig Dispense Refill  . lidocaine-prilocaine (EMLA) cream Apply topically as needed. Apply to port 1 hour before chemotherapy.  30 g  0  . loratadine (CLARITIN) 10 MG tablet Take 1 tablet (10 mg total) by mouth daily as needed for allergies.  30 tablet  0  . oxyCODONE-acetaminophen (PERCOCET/ROXICET) 5-325 MG per tablet Take 1 tablet by mouth every 6 (six) hours as needed.  30 tablet  0  . prochlorperazine (COMPAZINE) 10 MG tablet Take 1 tablet (10 mg total) by mouth every 6 (six) hours as needed.  60 tablet  0   No current facility-administered medications for this visit.   Facility-Administered Medications Ordered in Other Visits  Medication Dose Route Frequency Provider Last Rate Last Dose  . sodium chloride 0.9 % injection 10 mL  10 mL Intracatheter PRN Conni Slipper, PA-C   10 mL at 08/09/13 1540    SURGICAL HISTORY:  Past Surgical History  Procedure Laterality Date  . Lobectomy    . Craniotomy Left 04/27/2013    Procedure: Left Occipital Craniotomy for resection of tumor with microscope;  Surgeon: Karn Cassis, MD;  Location: [facility];  Service: Neurosurgery;  Laterality: Left;    REVIEW OF SYSTEMS:  Constitutional: positive for fatigue Eyes: negative Ears, nose, mouth, throat, and face: negative Respiratory: negative Cardiovascular: negative Gastrointestinal: positive for constipation Genitourinary:negative Integument/breast: negative Hematologic/lymphatic: negative Musculoskeletal:negative  Neurological: negative Behavioral/Psych: negative Endocrine: negative Allergic/Immunologic: negative   PHYSICAL EXAMINATION: General appearance: alert, cooperative and no distress Head: Normocephalic, without obvious abnormality, atraumatic Neck: no adenopathy, no carotid bruit, no JVD, supple, symmetrical, trachea midline and thyroid  not enlarged, symmetric, no tenderness/mass/nodules Lymph nodes: Cervical, supraclavicular, and axillary nodes normal. Resp: clear to auscultation bilaterally Cardio: regular rate and rhythm, S1, S2 normal, no murmur, click, rub or gallop GI: soft, non-tender; bowel sounds normal; no masses,  no organomegaly Extremities: extremities normal, atraumatic, no cyanosis or edema Neurologic: Alert and oriented X 3, normal strength and tone. Normal symmetric reflexes. Normal coordination and gait Mouth: Reveals a poor dentition with  6 remaining teeth. In the left upper posterior gum line there is a tooth with jagged edges The tongue and buccal mucosa appear dry, no evidence of thrush or mucositis  ECOG PERFORMANCE STATUS: 1 - Symptomatic but completely ambulatory  Blood pressure 133/84, pulse 74, temperature 97 F (36.1 C), temperature source Oral, resp. rate 18, height 6' (1.829 m), weight 156 lb 8 oz (70.988 kg), SpO2 97.00%.  LABORATORY DATA: Lab Results  Component Value Date   WBC 8.2 08/09/2013   HGB 10.9* 08/09/2013   HCT 31.3* 08/09/2013   MCV 96.0 08/09/2013   PLT 195 08/09/2013      Chemistry      Component Value Date/Time   NA 139 08/09/2013 1101   NA 136 06/03/2013 1310   K 4.0 08/09/2013 1101   K 3.8 06/03/2013 1310   CL 98 06/03/2013 1310   CO2 27 08/09/2013 1101   CO2 25 06/03/2013 1310   BUN 10.1 08/09/2013 1101   BUN 12 06/03/2013 1310   CREATININE 0.9 08/09/2013 1101   CREATININE 0.91 06/03/2013 1310   CREATININE 1.04 01/28/2012 1454      Component Value Date/Time   CALCIUM 9.1 08/09/2013 1101   CALCIUM 9.0 06/03/2013 1310   ALKPHOS 121 08/09/2013 1101   ALKPHOS 122* 04/21/2013 1041   AST 26 08/09/2013 1101   AST 29 04/21/2013 1041   ALT 21 08/09/2013 1101   ALT 28 04/21/2013 1041   BILITOT 0.78 08/09/2013 1101   BILITOT 0.7 04/21/2013 1041       RADIOGRAPHIC STUDIES:  Ct Chest W Contrast  07/16/2013   CLINICAL DATA:  Restaging small-cell lung cancer. Intracranial and  hepatic metastases. Chemotherapy ongoing.  EXAM: CT CHEST, ABDOMEN, AND PELVIS WITH CONTRAST  TECHNIQUE: Multidetector CT imaging of the chest, abdomen and pelvis was performed following the standard protocol during bolus administration of intravenous contrast.  CONTRAST:  OMNIPAQUE IOHEXOL 300 MG/ML  SOLN  COMPARISON:  Prior examinations 06/02/2013 and 04/21/2013.  FINDINGS:   CT CHEST FINDINGS  Right IJ Port-A-Cath tip extends to the SVC right atrial junction. Small mediastinal and paraesophageal lymph nodes are unchanged. There are no pathologically enlarged mediastinal, hilar or axillary lymph nodes.  There is stable atherosclerosis of the aorta, great vessels and coronary arteries. No significant pleural or pericardial effusion is present.  There are stable postsurgical changes in the right upper lobe. The cavitary left upper lobe mass measures 3.3 x 2.2 cm on image 22 (previously 3.5 x 2.8 cm). No new or enlarging nodules are identified. There are no worrisome osseous findings.    CT ABDOMEN AND PELVIS FINDINGS  Interval improvement in multifocal hepatic metastatic disease. The largest lesion posteriorly in the right hepatic lobe measures 5.3 x 4.4 cm on image 53. Lesion inferiorly in the right hepatic lobe measures 4.8 x 3.9  cm on image 66 (previously 5.8 x 5.4 cm).  The inferior splenic lesion also slightly smaller, measuring 4.1 x 3.6 cm on image 74 (previously 4.6 x 3.3 cm). No new splenic lesions are identified.  There is no adrenal mass. The gallbladder is contracted without focal abnormality. Probable gallstones are again noted. The pancreas and kidneys appear unremarkable.  No significant abnormalities of the stomach, small bowel, appendix or colon are seen. There is stable aortoiliac atherosclerosis. There is no adenopathy or ascites. Mild bladder wall thickening is noted. There are calcifications within the base of the penis.  No worrisome osseous findings are demonstrated. Vague lucent  lesion in the left iliac bone on image 100 is unchanged. Diffuse lumbar spondylosis noted.  IMPRESSION: 1. Slight interval decrease in size of cavitary left upper lobe lung mass consistent with primary lung cancer. 2. No evidence of thoracic metastatic disease. 3. Interval improvement in multifocal hepatic metastatic disease. The indeterminate splenic lesion has also slightly decreased in size, suggesting that this may reflect an atypical metastasis. No evidence of disease progression.   Electronically Signed   By: Roxy Horseman M.D.   On: 07/16/2013 12:46    ASSESSMENT/PLAN:  The patient is feeling fine today with no specific complaints. He tolerated the first 2 cycles of his treatment with carboplatin and etoposide fairly well except for mild fatigue.  His recent scan showed improvement in his disease. Patient was discussed with an also seen by Dr. Arbutus Ped. He will continue with reduced dose carboplatin for an AUC of 4 and etoposide at 100 mg per meter squared. He will proceed with cycle #4 of his chemotherapy today as scheduled. He'll continue with weekly labs consisting of a CBC differential and C. met as scheduled. He will followup with Dr. Arbutus Ped in 3 weeks with restaging CT scan of his chest, abdomen and pelvis with contrast to reevaluate his disease.   He was advised to call immediately if he has any concerning symptoms in the interval.  All questions were answered. The patient knows to call the clinic with any problems, questions or concerns. We can certainly see the patient much sooner if necessary.    Conni Slipper PA-C  ADDENDUM: Hematology/Oncology Attending: I had a face to face encounter with the patient. I recommended for his care plan. This is a very pleasant 52 years old white male with extensive stage small cell lung cancer currently undergoing systemic chemotherapy with carboplatin and etoposide status post 3 cycles. He is rating his treatment fairly well with no significant  adverse effects. I recommended for the patient to proceed with cycle #4 today as scheduled. He would come back for follow up visit in 3 weeks with repeat CT scan of the chest, abdomen and pelvis for restaging of his disease. He was advised to call immediately if he has any concerning symptoms in the interval. Lajuana Matte., MD 08/11/2013

## 2013-08-10 ENCOUNTER — Ambulatory Visit (HOSPITAL_BASED_OUTPATIENT_CLINIC_OR_DEPARTMENT_OTHER): Payer: Medicaid Other

## 2013-08-10 VITALS — BP 130/75 | HR 92 | Temp 98.2°F

## 2013-08-10 DIAGNOSIS — Z5111 Encounter for antineoplastic chemotherapy: Secondary | ICD-10-CM

## 2013-08-10 DIAGNOSIS — C3491 Malignant neoplasm of unspecified part of right bronchus or lung: Secondary | ICD-10-CM

## 2013-08-10 DIAGNOSIS — C787 Secondary malignant neoplasm of liver and intrahepatic bile duct: Secondary | ICD-10-CM

## 2013-08-10 DIAGNOSIS — C3410 Malignant neoplasm of upper lobe, unspecified bronchus or lung: Secondary | ICD-10-CM

## 2013-08-10 MED ORDER — HEPARIN SOD (PORK) LOCK FLUSH 100 UNIT/ML IV SOLN
500.0000 [IU] | Freq: Once | INTRAVENOUS | Status: AC | PRN
  Administered 2013-08-10: 500 [IU]
  Filled 2013-08-10: qty 5

## 2013-08-10 MED ORDER — DEXAMETHASONE SODIUM PHOSPHATE 10 MG/ML IJ SOLN
10.0000 mg | Freq: Once | INTRAMUSCULAR | Status: AC
  Administered 2013-08-10: 10 mg via INTRAVENOUS

## 2013-08-10 MED ORDER — SODIUM CHLORIDE 0.9 % IV SOLN
100.0000 mg/m2 | Freq: Once | INTRAVENOUS | Status: AC
  Administered 2013-08-10: 200 mg via INTRAVENOUS
  Filled 2013-08-10: qty 10

## 2013-08-10 MED ORDER — ONDANSETRON 8 MG/NS 50 ML IVPB
INTRAVENOUS | Status: AC
  Filled 2013-08-10: qty 8

## 2013-08-10 MED ORDER — DEXAMETHASONE SODIUM PHOSPHATE 10 MG/ML IJ SOLN
INTRAMUSCULAR | Status: AC
  Filled 2013-08-10: qty 1

## 2013-08-10 MED ORDER — ONDANSETRON 8 MG/50ML IVPB (CHCC)
8.0000 mg | Freq: Once | INTRAVENOUS | Status: AC
  Administered 2013-08-10: 8 mg via INTRAVENOUS

## 2013-08-10 MED ORDER — SODIUM CHLORIDE 0.9 % IJ SOLN
10.0000 mL | INTRAMUSCULAR | Status: DC | PRN
  Administered 2013-08-10: 10 mL
  Filled 2013-08-10: qty 10

## 2013-08-10 MED ORDER — SODIUM CHLORIDE 0.9 % IV SOLN
Freq: Once | INTRAVENOUS | Status: AC
  Administered 2013-08-10: 13:00:00 via INTRAVENOUS

## 2013-08-10 NOTE — Patient Instructions (Signed)
[facility] Discharge Instructions for Patients Receiving Chemotherapy  Today you received the following chemotherapy agents: Etoposide.  To help prevent nausea and vomiting after your treatment, we encourage you to take your nausea medication: Compazine 10 mg every 6 hours as needed.   If you develop nausea and vomiting that is not controlled by your nausea medication, call the clinic.   BELOW ARE SYMPTOMS THAT SHOULD BE REPORTED IMMEDIATELY:  *FEVER GREATER THAN 100.5 F  *CHILLS WITH OR WITHOUT FEVER  NAUSEA AND VOMITING THAT IS NOT CONTROLLED WITH YOUR NAUSEA MEDICATION  *UNUSUAL SHORTNESS OF BREATH  *UNUSUAL BRUISING OR BLEEDING  TENDERNESS IN MOUTH AND THROAT WITH OR WITHOUT PRESENCE OF ULCERS  *URINARY PROBLEMS  *BOWEL PROBLEMS  UNUSUAL RASH Items with * indicate a potential emergency and should be followed up as soon as possible.  Feel free to call the clinic you have any questions or concerns. The clinic phone number is [phone].    

## 2013-08-11 ENCOUNTER — Encounter: Payer: Self-pay | Admitting: *Deleted

## 2013-08-11 ENCOUNTER — Ambulatory Visit (HOSPITAL_BASED_OUTPATIENT_CLINIC_OR_DEPARTMENT_OTHER): Payer: Medicaid Other

## 2013-08-11 DIAGNOSIS — C787 Secondary malignant neoplasm of liver and intrahepatic bile duct: Secondary | ICD-10-CM

## 2013-08-11 DIAGNOSIS — Z5111 Encounter for antineoplastic chemotherapy: Secondary | ICD-10-CM

## 2013-08-11 DIAGNOSIS — C3491 Malignant neoplasm of unspecified part of right bronchus or lung: Secondary | ICD-10-CM

## 2013-08-11 DIAGNOSIS — C3410 Malignant neoplasm of upper lobe, unspecified bronchus or lung: Secondary | ICD-10-CM

## 2013-08-11 MED ORDER — SODIUM CHLORIDE 0.9 % IV SOLN
Freq: Once | INTRAVENOUS | Status: AC
  Administered 2013-08-11: 13:00:00 via INTRAVENOUS

## 2013-08-11 MED ORDER — ONDANSETRON 8 MG/NS 50 ML IVPB
INTRAVENOUS | Status: AC
  Filled 2013-08-11: qty 8

## 2013-08-11 MED ORDER — SODIUM CHLORIDE 0.9 % IV SOLN
100.0000 mg/m2 | Freq: Once | INTRAVENOUS | Status: AC
  Administered 2013-08-11: 200 mg via INTRAVENOUS
  Filled 2013-08-11: qty 10

## 2013-08-11 MED ORDER — SODIUM CHLORIDE 0.9 % IJ SOLN
10.0000 mL | INTRAMUSCULAR | Status: DC | PRN
  Administered 2013-08-11: 10 mL
  Filled 2013-08-11: qty 10

## 2013-08-11 MED ORDER — ONDANSETRON 8 MG/50ML IVPB (CHCC)
8.0000 mg | Freq: Once | INTRAVENOUS | Status: AC
  Administered 2013-08-11: 8 mg via INTRAVENOUS

## 2013-08-11 MED ORDER — HEPARIN SOD (PORK) LOCK FLUSH 100 UNIT/ML IV SOLN
500.0000 [IU] | Freq: Once | INTRAVENOUS | Status: AC | PRN
  Administered 2013-08-11: 500 [IU]
  Filled 2013-08-11: qty 5

## 2013-08-11 MED ORDER — DEXAMETHASONE SODIUM PHOSPHATE 10 MG/ML IJ SOLN
INTRAMUSCULAR | Status: AC
  Filled 2013-08-11: qty 1

## 2013-08-11 MED ORDER — DEXAMETHASONE SODIUM PHOSPHATE 10 MG/ML IJ SOLN
10.0000 mg | Freq: Once | INTRAMUSCULAR | Status: AC
  Administered 2013-08-11: 10 mg via INTRAVENOUS

## 2013-08-11 MED ORDER — ONDANSETRON HCL 8 MG PO TABS
ORAL_TABLET | ORAL | Status: AC
  Filled 2013-08-11: qty 1

## 2013-08-11 NOTE — Patient Instructions (Signed)
Continue with weekly labs as scheduled  Followup with Dr. Mohamed in 3 weeks with restaging CT scan of your chest, abdomen and pelvis to reevaluate your disease 

## 2013-08-11 NOTE — Patient Instructions (Signed)
[facility] Discharge Instructions for Patients Receiving Chemotherapy  Today you received the following chemotherapy agents [id number]  To help prevent nausea and vomiting after your treatment, we encourage you to take your nausea medication as directed   If you develop nausea and vomiting that is not controlled by your nausea medication, call the clinic.   BELOW ARE SYMPTOMS THAT SHOULD BE REPORTED IMMEDIATELY:  *FEVER GREATER THAN 100.5 F  *CHILLS WITH OR WITHOUT FEVER  NAUSEA AND VOMITING THAT IS NOT CONTROLLED WITH YOUR NAUSEA MEDICATION  *UNUSUAL SHORTNESS OF BREATH  *UNUSUAL BRUISING OR BLEEDING  TENDERNESS IN MOUTH AND THROAT WITH OR WITHOUT PRESENCE OF ULCERS  *URINARY PROBLEMS  *BOWEL PROBLEMS  UNUSUAL RASH Items with * indicate a potential emergency and should be followed up as soon as possible.  Feel free to call the clinic you have any questions or concerns. The clinic phone number is [phone].

## 2013-08-12 ENCOUNTER — Encounter: Payer: Self-pay | Admitting: Radiation Oncology

## 2013-08-12 ENCOUNTER — Telehealth: Payer: Self-pay | Admitting: *Deleted

## 2013-08-12 ENCOUNTER — Ambulatory Visit: Payer: Self-pay

## 2013-08-12 ENCOUNTER — Ambulatory Visit
Admission: RE | Admit: 2013-08-12 | Discharge: 2013-08-12 | Disposition: A | Discharge status: Home / Self Care | Payer: Medicaid Other | Source: Ambulatory Visit | Attending: Radiation Oncology | Admitting: Radiation Oncology

## 2013-08-12 ENCOUNTER — Ambulatory Visit (HOSPITAL_BASED_OUTPATIENT_CLINIC_OR_DEPARTMENT_OTHER): Payer: Medicaid Other

## 2013-08-12 VITALS — BP 131/71 | HR 81 | Temp 98.6°F | Resp 16 | Wt 152.1 lb

## 2013-08-12 VITALS — BP 127/77 | HR 89 | Temp 98.1°F

## 2013-08-12 DIAGNOSIS — C3410 Malignant neoplasm of upper lobe, unspecified bronchus or lung: Secondary | ICD-10-CM

## 2013-08-12 DIAGNOSIS — C3491 Malignant neoplasm of unspecified part of right bronchus or lung: Secondary | ICD-10-CM

## 2013-08-12 DIAGNOSIS — Z5189 Encounter for other specified aftercare: Secondary | ICD-10-CM

## 2013-08-12 DIAGNOSIS — C7931 Secondary malignant neoplasm of brain: Secondary | ICD-10-CM

## 2013-08-12 MED ORDER — PEGFILGRASTIM INJECTION 6 MG/0.6ML
6.0000 mg | Freq: Once | SUBCUTANEOUS | Status: AC
  Administered 2013-08-12: 6 mg via SUBCUTANEOUS
  Filled 2013-08-12: qty 0.6

## 2013-08-12 NOTE — Progress Notes (Signed)
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   Interval Since Last Radiation:  2  months  Narrative:  The patient returns today for routine follow-up.  He is asymptomatic.                              ALLERGIES:  is allergic to vicodin.  Meds: Current Outpatient Prescriptions  Medication Sig Dispense Refill  . loratadine (CLARITIN) 10 MG tablet Take 1 tablet (10 mg total) by mouth daily as needed for allergies.  30 tablet  0  . lidocaine-prilocaine (EMLA) cream Apply topically as needed. Apply to port 1 hour before chemotherapy.  30 g  0  . oxyCODONE-acetaminophen (PERCOCET/ROXICET) 5-325 MG per tablet Take 1 tablet by mouth every 6 (six) hours as needed.  30 tablet  0  . prochlorperazine (COMPAZINE) 10 MG tablet Take 1 tablet (10 mg total) by mouth every 6 (six) hours as needed.  60 tablet  0   No current facility-administered medications for this encounter.    Physical Findings: The patient is in no acute distress. Patient is alert and oriented.  weight is 152 lb 1.6 oz (68.992 kg). His oral temperature is 98.6 F (37 C). His blood pressure is 131/71 and his pulse is 81. His respiration is 16 and oxygen saturation is 100%. .  No significant changes.  Lab Findings: Lab Results  Component Value Date   WBC 8.2 08/09/2013   HGB 10.9* 08/09/2013   HCT 31.3* 08/09/2013   MCV 96.0 08/09/2013   PLT 195 08/09/2013    @LASTCHEM @  Radiographic Findings: Ct Chest W Contrast  07/16/2013   CLINICAL DATA:  Restaging small-cell lung cancer. Intracranial and hepatic metastases. Chemotherapy ongoing.  EXAM: CT CHEST,  ABDOMEN, AND PELVIS WITH CONTRAST  TECHNIQUE: Multidetector CT imaging of the chest, abdomen and pelvis was performed following the standard protocol during bolus administration of intravenous contrast.  CONTRAST:  OMNIPAQUE IOHEXOL 300 MG/ML  SOLN  COMPARISON:  Prior examinations 06/02/2013 and 04/21/2013.  FINDINGS: CT CHEST FINDINGS  Right IJ Port-A-Cath tip extends to the SVC right atrial junction. Small mediastinal and paraesophageal lymph nodes are unchanged. There are no pathologically enlarged mediastinal, hilar or axillary lymph nodes.  There is stable atherosclerosis of the aorta, great vessels and coronary arteries. No significant pleural or pericardial effusion is present.  There are stable postsurgical changes in the right upper lobe. The cavitary left upper lobe mass measures 3.3 x 2.2 cm on image 22 (previously 3.5 x 2.8 cm). No new or enlarging nodules are identified. There are no worrisome osseous findings.  CT ABDOMEN AND PELVIS FINDINGS  Interval improvement in multifocal hepatic metastatic disease. The largest lesion posteriorly in the right hepatic lobe measures 5.3 x 4.4 cm on image 53. Lesion inferiorly in the right hepatic lobe measures 4.8 x 3.9 cm on image 66 (previously 5.8 x 5.4 cm).  The inferior splenic lesion also slightly smaller, measuring 4.1 x 3.6 cm on image 74 (previously 4.6 x 3.3 cm). No new splenic lesions are identified.  There  is no adrenal mass. The gallbladder is contracted without focal abnormality. Probable gallstones are again noted. The pancreas and kidneys appear unremarkable.  No significant abnormalities of the stomach, small bowel, appendix or colon are seen. There is stable aortoiliac atherosclerosis. There is no adenopathy or ascites. Mild bladder wall thickening is noted. There are calcifications within the base of the penis.  No worrisome osseous findings are demonstrated. Vague lucent lesion in the left iliac bone on image 100 is unchanged. Diffuse  lumbar spondylosis noted.  IMPRESSION: 1. Slight interval decrease in size of cavitary left upper lobe lung mass consistent with primary lung cancer. 2. No evidence of thoracic metastatic disease. 3. Interval improvement in multifocal hepatic metastatic disease. The indeterminate splenic lesion has also slightly decreased in size, suggesting that this may reflect an atypical metastasis. No evidence of disease progression.   Electronically Signed   By: Roxy Horseman M.D.   On: 07/16/2013 12:46   Ct Abdomen Pelvis W Contrast  07/16/2013   CLINICAL DATA:  Restaging small-cell lung cancer. Intracranial and hepatic metastases. Chemotherapy ongoing.  EXAM: CT CHEST, ABDOMEN, AND PELVIS WITH CONTRAST  TECHNIQUE: Multidetector CT imaging of the chest, abdomen and pelvis was performed following the standard protocol during bolus administration of intravenous contrast.  CONTRAST:  OMNIPAQUE IOHEXOL 300 MG/ML  SOLN  COMPARISON:  Prior examinations 06/02/2013 and 04/21/2013.  FINDINGS: CT CHEST FINDINGS  Right IJ Port-A-Cath tip extends to the SVC right atrial junction. Small mediastinal and paraesophageal lymph nodes are unchanged. There are no pathologically enlarged mediastinal, hilar or axillary lymph nodes.  There is stable atherosclerosis of the aorta, great vessels and coronary arteries. No significant pleural or pericardial effusion is present.  There are stable postsurgical changes in the right upper lobe. The cavitary left upper lobe mass measures 3.3 x 2.2 cm on image 22 (previously 3.5 x 2.8 cm). No new or enlarging nodules are identified. There are no worrisome osseous findings.  CT ABDOMEN AND PELVIS FINDINGS  Interval improvement in multifocal hepatic metastatic disease. The largest lesion posteriorly in the right hepatic lobe measures 5.3 x 4.4 cm on image 53. Lesion inferiorly in the right hepatic lobe measures 4.8 x 3.9 cm on image 66 (previously 5.8 x 5.4 cm).  The inferior splenic lesion also  slightly smaller, measuring 4.1 x 3.6 cm on image 74 (previously 4.6 x 3.3 cm). No new splenic lesions are identified.  There is no adrenal mass. The gallbladder is contracted without focal abnormality. Probable gallstones are again noted. The pancreas and kidneys appear unremarkable.  No significant abnormalities of the stomach, small bowel, appendix or colon are seen. There is stable aortoiliac atherosclerosis. There is no adenopathy or ascites. Mild bladder wall thickening is noted. There are calcifications within the base of the penis.  No worrisome osseous findings are demonstrated. Vague lucent lesion in the left iliac bone on image 100 is unchanged. Diffuse lumbar spondylosis noted.  IMPRESSION: 1. Slight interval decrease in size of cavitary left upper lobe lung mass consistent with primary lung cancer. 2. No evidence of thoracic metastatic disease. 3. Interval improvement in multifocal hepatic metastatic disease. The indeterminate splenic lesion has also slightly decreased in size, suggesting that this may reflect an atypical metastasis. No evidence of disease progression.   Electronically Signed   By: Roxy Horseman M.D.   On: 07/16/2013 12:46    Impression:  The patient is recovering from the effects of radiation.    Plan:  Post-XRT baseline MRI and follow-up  in 2 months  _____________________________________  [name]. Kathrynn Running, M.D.

## 2013-08-12 NOTE — Addendum Note (Signed)
Encounter addended by: Oneita Hurt, MD on: 08/12/2013 11:54 AM<BR>     Documentation filed: Normajean Glasgow VN

## 2013-08-12 NOTE — Progress Notes (Signed)
Flat affect noted. Denies pain at this time. Denies headache, dizziness, nausea, vomiting, diplopia or ringing in the ear. Without complaints at this time. Steady gait noted. Does not take decadron.

## 2013-08-12 NOTE — Telephone Encounter (Signed)
Called patient to inform of lab, test and fu visit, lvm for a return call 

## 2013-08-16 ENCOUNTER — Other Ambulatory Visit: Payer: Self-pay | Admitting: Lab

## 2013-08-16 ENCOUNTER — Telehealth: Payer: Self-pay | Admitting: Internal Medicine

## 2013-08-16 NOTE — Telephone Encounter (Signed)
s.w. pt and he needed to r/s todays lab...done

## 2013-08-17 ENCOUNTER — Other Ambulatory Visit: Payer: Self-pay

## 2013-08-17 ENCOUNTER — Telehealth: Payer: Self-pay | Admitting: Internal Medicine

## 2013-08-17 NOTE — Telephone Encounter (Signed)
s.w. pt on incomming call to r/s lab due to transportation problems...done

## 2013-08-18 ENCOUNTER — Encounter (INDEPENDENT_AMBULATORY_CARE_PROVIDER_SITE_OTHER): Payer: Self-pay

## 2013-08-18 ENCOUNTER — Other Ambulatory Visit (HOSPITAL_BASED_OUTPATIENT_CLINIC_OR_DEPARTMENT_OTHER): Payer: Medicaid Other

## 2013-08-18 DIAGNOSIS — C3410 Malignant neoplasm of upper lobe, unspecified bronchus or lung: Secondary | ICD-10-CM

## 2013-08-18 DIAGNOSIS — C3491 Malignant neoplasm of unspecified part of right bronchus or lung: Secondary | ICD-10-CM

## 2013-08-18 LAB — CBC WITH DIFFERENTIAL/PLATELET
BASO%: 0.3 % (ref 0.0–2.0)
Basophils Absolute: 0 10*3/uL (ref 0.0–0.1)
EOS%: 0.1 % (ref 0.0–7.0)
HCT: 26.1 % — ABNORMAL LOW (ref 38.4–49.9)
MCH: 33.9 pg — ABNORMAL HIGH (ref 27.2–33.4)
MCHC: 34 g/dL (ref 32.0–36.0)
MCV: 99.8 fL — ABNORMAL HIGH (ref 79.3–98.0)
MONO%: 6.4 % (ref 0.0–14.0)
NEUT%: 75.1 % — ABNORMAL HIGH (ref 39.0–75.0)
lymph#: 0.5 10*3/uL — ABNORMAL LOW (ref 0.9–3.3)

## 2013-08-18 LAB — COMPREHENSIVE METABOLIC PANEL (CC13)
ALT: 25 U/L (ref 0–55)
AST: 19 U/L (ref 5–34)
Alkaline Phosphatase: 142 U/L (ref 40–150)
BUN: 16.2 mg/dL (ref 7.0–26.0)
Calcium: 9.7 mg/dL (ref 8.4–10.4)
Chloride: 97 mEq/L — ABNORMAL LOW (ref 98–109)
Creatinine: 1 mg/dL (ref 0.7–1.3)
Sodium: 136 mEq/L (ref 136–145)
Total Bilirubin: 1.47 mg/dL — ABNORMAL HIGH (ref 0.20–1.20)
Total Protein: 7.4 g/dL (ref 6.4–8.3)

## 2013-08-23 ENCOUNTER — Other Ambulatory Visit: Payer: Self-pay

## 2013-08-25 ENCOUNTER — Telehealth: Payer: Self-pay | Admitting: Internal Medicine

## 2013-08-25 NOTE — Telephone Encounter (Signed)
returned pt call and s/w pt to r/s missed lab....done and pt aware

## 2013-08-26 ENCOUNTER — Other Ambulatory Visit: Payer: Self-pay

## 2013-08-26 ENCOUNTER — Other Ambulatory Visit: Payer: Self-pay | Admitting: Physician Assistant

## 2013-08-26 ENCOUNTER — Encounter (INDEPENDENT_AMBULATORY_CARE_PROVIDER_SITE_OTHER): Payer: Self-pay

## 2013-08-26 ENCOUNTER — Other Ambulatory Visit (HOSPITAL_BASED_OUTPATIENT_CLINIC_OR_DEPARTMENT_OTHER): Payer: Medicaid Other

## 2013-08-26 ENCOUNTER — Telehealth: Payer: Self-pay | Admitting: Internal Medicine

## 2013-08-26 DIAGNOSIS — C787 Secondary malignant neoplasm of liver and intrahepatic bile duct: Secondary | ICD-10-CM

## 2013-08-26 DIAGNOSIS — C3491 Malignant neoplasm of unspecified part of right bronchus or lung: Secondary | ICD-10-CM

## 2013-08-26 DIAGNOSIS — C3410 Malignant neoplasm of upper lobe, unspecified bronchus or lung: Secondary | ICD-10-CM

## 2013-08-26 LAB — CBC WITH DIFFERENTIAL/PLATELET
Basophils Absolute: 0 10*3/uL (ref 0.0–0.1)
EOS%: 0.2 % (ref 0.0–7.0)
Eosinophils Absolute: 0 10*3/uL (ref 0.0–0.5)
HCT: 22.3 % — ABNORMAL LOW (ref 38.4–49.9)
HGB: 7.9 g/dL — ABNORMAL LOW (ref 13.0–17.1)
MCH: 34.1 pg — ABNORMAL HIGH (ref 27.2–33.4)
MCV: 96.1 fL (ref 79.3–98.0)
MONO%: 7.8 % (ref 0.0–14.0)
NEUT#: 4.3 10*3/uL (ref 1.5–6.5)
NEUT%: 74.2 % (ref 39.0–75.0)
Platelets: 45 10*3/uL — ABNORMAL LOW (ref 140–400)
RDW: 15.6 % — ABNORMAL HIGH (ref 11.0–14.6)

## 2013-08-26 LAB — COMPREHENSIVE METABOLIC PANEL (CC13)
ALT: 20 U/L (ref 0–55)
Albumin: 3.7 g/dL (ref 3.5–5.0)
Anion Gap: 11 mEq/L (ref 3–11)
BUN: 8.2 mg/dL (ref 7.0–26.0)
CO2: 25 mEq/L (ref 22–29)
Calcium: 8.8 mg/dL (ref 8.4–10.4)
Chloride: 103 mEq/L (ref 98–109)
Creatinine: 0.9 mg/dL (ref 0.7–1.3)
Potassium: 3.7 mEq/L (ref 3.5–5.1)

## 2013-08-26 NOTE — Telephone Encounter (Signed)
gave pt appt calendar for lab,md and chemo for December 2014

## 2013-08-27 ENCOUNTER — Other Ambulatory Visit: Payer: Self-pay | Admitting: *Deleted

## 2013-08-27 ENCOUNTER — Ambulatory Visit (HOSPITAL_COMMUNITY): Payer: Medicaid Other

## 2013-08-27 DIAGNOSIS — C3490 Malignant neoplasm of unspecified part of unspecified bronchus or lung: Secondary | ICD-10-CM

## 2013-08-27 NOTE — Progress Notes (Signed)
Hbg 7.9, pt states he is not really symptomatic, would like to wait for labs on Monday to see if the hbg recovers on it's own.  Type and hold added for Monday 08/30/13.  SLJ

## 2013-08-30 ENCOUNTER — Other Ambulatory Visit: Payer: Self-pay | Admitting: Internal Medicine

## 2013-08-30 ENCOUNTER — Ambulatory Visit: Payer: Self-pay | Admitting: Internal Medicine

## 2013-08-30 ENCOUNTER — Other Ambulatory Visit: Payer: Self-pay | Admitting: *Deleted

## 2013-08-30 ENCOUNTER — Other Ambulatory Visit: Payer: Self-pay

## 2013-08-30 ENCOUNTER — Ambulatory Visit (HOSPITAL_BASED_OUTPATIENT_CLINIC_OR_DEPARTMENT_OTHER): Payer: Medicaid Other

## 2013-08-30 ENCOUNTER — Other Ambulatory Visit (HOSPITAL_BASED_OUTPATIENT_CLINIC_OR_DEPARTMENT_OTHER): Payer: Medicaid Other

## 2013-08-30 DIAGNOSIS — Z5111 Encounter for antineoplastic chemotherapy: Secondary | ICD-10-CM

## 2013-08-30 DIAGNOSIS — C3491 Malignant neoplasm of unspecified part of right bronchus or lung: Secondary | ICD-10-CM

## 2013-08-30 DIAGNOSIS — C3410 Malignant neoplasm of upper lobe, unspecified bronchus or lung: Secondary | ICD-10-CM

## 2013-08-30 DIAGNOSIS — C7931 Secondary malignant neoplasm of brain: Secondary | ICD-10-CM

## 2013-08-30 DIAGNOSIS — C3490 Malignant neoplasm of unspecified part of unspecified bronchus or lung: Secondary | ICD-10-CM

## 2013-08-30 LAB — CBC WITH DIFFERENTIAL/PLATELET
Basophils Absolute: 0 10*3/uL (ref 0.0–0.1)
Eosinophils Absolute: 0 10*3/uL (ref 0.0–0.5)
MONO#: 0.7 10*3/uL (ref 0.1–0.9)
MONO%: 8.1 % (ref 0.0–14.0)
NEUT#: 7.2 10*3/uL — ABNORMAL HIGH (ref 1.5–6.5)
Platelets: 139 10*3/uL — ABNORMAL LOW (ref 140–400)
RBC: 2.63 10*6/uL — ABNORMAL LOW (ref 4.20–5.82)
RDW: 19.2 % — ABNORMAL HIGH (ref 11.0–14.6)
WBC: 8.9 10*3/uL (ref 4.0–10.3)
lymph#: 1 10*3/uL (ref 0.9–3.3)
nRBC: 0 % (ref 0–0)

## 2013-08-30 LAB — HOLD TUBE, BLOOD BANK

## 2013-08-30 MED ORDER — HEPARIN SOD (PORK) LOCK FLUSH 100 UNIT/ML IV SOLN
500.0000 [IU] | Freq: Once | INTRAVENOUS | Status: AC | PRN
  Administered 2013-08-30: 500 [IU]
  Filled 2013-08-30: qty 5

## 2013-08-30 MED ORDER — DEXAMETHASONE SODIUM PHOSPHATE 20 MG/5ML IJ SOLN
20.0000 mg | Freq: Once | INTRAMUSCULAR | Status: AC
  Administered 2013-08-30: 20 mg via INTRAVENOUS

## 2013-08-30 MED ORDER — DEXAMETHASONE SODIUM PHOSPHATE 20 MG/5ML IJ SOLN
INTRAMUSCULAR | Status: AC
  Filled 2013-08-30: qty 5

## 2013-08-30 MED ORDER — SODIUM CHLORIDE 0.9 % IV SOLN
480.0000 mg | Freq: Once | INTRAVENOUS | Status: AC
  Administered 2013-08-30: 480 mg via INTRAVENOUS
  Filled 2013-08-30: qty 48

## 2013-08-30 MED ORDER — SODIUM CHLORIDE 0.9 % IV SOLN
100.0000 mg/m2 | Freq: Once | INTRAVENOUS | Status: AC
  Administered 2013-08-30: 200 mg via INTRAVENOUS
  Filled 2013-08-30: qty 10

## 2013-08-30 MED ORDER — ONDANSETRON 16 MG/50ML IVPB (CHCC)
INTRAVENOUS | Status: AC
  Filled 2013-08-30: qty 16

## 2013-08-30 MED ORDER — SODIUM CHLORIDE 0.9 % IJ SOLN
10.0000 mL | INTRAMUSCULAR | Status: DC | PRN
  Administered 2013-08-30: 10 mL
  Filled 2013-08-30: qty 10

## 2013-08-30 MED ORDER — ONDANSETRON 16 MG/50ML IVPB (CHCC)
16.0000 mg | Freq: Once | INTRAVENOUS | Status: AC
Start: 2013-08-30 — End: 2013-08-30
  Administered 2013-08-30: 16 mg via INTRAVENOUS

## 2013-08-30 MED ORDER — SODIUM CHLORIDE 0.9 % IV SOLN
Freq: Once | INTRAVENOUS | Status: AC
  Administered 2013-08-30: 13:00:00 via INTRAVENOUS

## 2013-08-30 NOTE — Progress Notes (Signed)
HGB-9.0 with today's CBC results.  No need for blood transfusion per Dr. Arbutus Ped.

## 2013-08-30 NOTE — Patient Instructions (Signed)
[facility] Discharge Instructions for Patients Receiving Chemotherapy  Today you received the following chemotherapy agents:  Etoposide and Carboplatin  To help prevent nausea and vomiting after your treatment, we encourage you to take your nausea medication as ordered per MD.   If you develop nausea and vomiting that is not controlled by your nausea medication, call the clinic.   BELOW ARE SYMPTOMS THAT SHOULD BE REPORTED IMMEDIATELY:  *FEVER GREATER THAN 100.5 F  *CHILLS WITH OR WITHOUT FEVER  NAUSEA AND VOMITING THAT IS NOT CONTROLLED WITH YOUR NAUSEA MEDICATION  *UNUSUAL SHORTNESS OF BREATH  *UNUSUAL BRUISING OR BLEEDING  TENDERNESS IN MOUTH AND THROAT WITH OR WITHOUT PRESENCE OF ULCERS  *URINARY PROBLEMS  *BOWEL PROBLEMS  UNUSUAL RASH Items with * indicate a potential emergency and should be followed up as soon as possible.  Feel free to call the clinic you have any questions or concerns. The clinic phone number is [phone].    

## 2013-08-31 ENCOUNTER — Ambulatory Visit (HOSPITAL_COMMUNITY)
Admission: RE | Admit: 2013-08-31 | Discharge: 2013-08-31 | Disposition: A | Discharge status: Home / Self Care | Payer: Medicaid Other | Source: Ambulatory Visit | Attending: Physician Assistant | Admitting: Physician Assistant

## 2013-08-31 ENCOUNTER — Encounter (HOSPITAL_COMMUNITY): Payer: Self-pay

## 2013-08-31 ENCOUNTER — Ambulatory Visit (HOSPITAL_BASED_OUTPATIENT_CLINIC_OR_DEPARTMENT_OTHER): Payer: Medicaid Other

## 2013-08-31 VITALS — BP 110/64 | HR 83 | Temp 98.3°F

## 2013-08-31 DIAGNOSIS — M47817 Spondylosis without myelopathy or radiculopathy, lumbosacral region: Secondary | ICD-10-CM | POA: Insufficient documentation

## 2013-08-31 DIAGNOSIS — I700 Atherosclerosis of aorta: Secondary | ICD-10-CM | POA: Insufficient documentation

## 2013-08-31 DIAGNOSIS — C3491 Malignant neoplasm of unspecified part of right bronchus or lung: Secondary | ICD-10-CM

## 2013-08-31 DIAGNOSIS — C787 Secondary malignant neoplasm of liver and intrahepatic bile duct: Secondary | ICD-10-CM | POA: Insufficient documentation

## 2013-08-31 DIAGNOSIS — C3410 Malignant neoplasm of upper lobe, unspecified bronchus or lung: Secondary | ICD-10-CM

## 2013-08-31 DIAGNOSIS — Z5111 Encounter for antineoplastic chemotherapy: Secondary | ICD-10-CM

## 2013-08-31 DIAGNOSIS — C3490 Malignant neoplasm of unspecified part of unspecified bronchus or lung: Secondary | ICD-10-CM | POA: Insufficient documentation

## 2013-08-31 DIAGNOSIS — I2510 Atherosclerotic heart disease of native coronary artery without angina pectoris: Secondary | ICD-10-CM | POA: Insufficient documentation

## 2013-08-31 DIAGNOSIS — C7889 Secondary malignant neoplasm of other digestive organs: Secondary | ICD-10-CM | POA: Insufficient documentation

## 2013-08-31 MED ORDER — ONDANSETRON 8 MG/NS 50 ML IVPB
INTRAVENOUS | Status: AC
  Filled 2013-08-31: qty 8

## 2013-08-31 MED ORDER — ONDANSETRON 8 MG/50ML IVPB (CHCC)
8.0000 mg | Freq: Once | INTRAVENOUS | Status: AC
  Administered 2013-08-31: 8 mg via INTRAVENOUS

## 2013-08-31 MED ORDER — SODIUM CHLORIDE 0.9 % IV SOLN
100.0000 mg/m2 | Freq: Once | INTRAVENOUS | Status: AC
  Administered 2013-08-31: 200 mg via INTRAVENOUS
  Filled 2013-08-31: qty 10

## 2013-08-31 MED ORDER — DEXAMETHASONE SODIUM PHOSPHATE 10 MG/ML IJ SOLN
INTRAMUSCULAR | Status: AC
  Filled 2013-08-31: qty 1

## 2013-08-31 MED ORDER — DEXAMETHASONE SODIUM PHOSPHATE 10 MG/ML IJ SOLN
10.0000 mg | Freq: Once | INTRAMUSCULAR | Status: AC
  Administered 2013-08-31: 10 mg via INTRAVENOUS

## 2013-08-31 MED ORDER — IOHEXOL 300 MG/ML  SOLN
100.0000 mL | Freq: Once | INTRAMUSCULAR | Status: AC | PRN
  Administered 2013-08-31: 100 mL via INTRAVENOUS

## 2013-08-31 MED ORDER — SODIUM CHLORIDE 0.9 % IV SOLN
Freq: Once | INTRAVENOUS | Status: AC
  Administered 2013-08-31: 12:00:00 via INTRAVENOUS

## 2013-08-31 MED ORDER — HEPARIN SOD (PORK) LOCK FLUSH 100 UNIT/ML IV SOLN
500.0000 [IU] | Freq: Once | INTRAVENOUS | Status: AC | PRN
  Administered 2013-08-31: 500 [IU]
  Filled 2013-08-31: qty 5

## 2013-08-31 MED ORDER — SODIUM CHLORIDE 0.9 % IJ SOLN
10.0000 mL | INTRAMUSCULAR | Status: DC | PRN
  Administered 2013-08-31: 10 mL
  Filled 2013-08-31: qty 10

## 2013-08-31 NOTE — Patient Instructions (Signed)
[facility] Discharge Instructions for Patients Receiving Chemotherapy  Today you received the following chemotherapy agents etoposide  To help prevent nausea and vomiting after your treatment, we encourage you to take your nausea medication as needed   If you develop nausea and vomiting that is not controlled by your nausea medication, call the clinic.   BELOW ARE SYMPTOMS THAT SHOULD BE REPORTED IMMEDIATELY:  *FEVER GREATER THAN 100.5 F  *CHILLS WITH OR WITHOUT FEVER  NAUSEA AND VOMITING THAT IS NOT CONTROLLED WITH YOUR NAUSEA MEDICATION  *UNUSUAL SHORTNESS OF BREATH  *UNUSUAL BRUISING OR BLEEDING  TENDERNESS IN MOUTH AND THROAT WITH OR WITHOUT PRESENCE OF ULCERS  *URINARY PROBLEMS  *BOWEL PROBLEMS  UNUSUAL RASH Items with * indicate a potential emergency and should be followed up as soon as possible.  Feel free to call the clinic you have any questions or concerns. The clinic phone number is [phone].    

## 2013-09-01 ENCOUNTER — Telehealth: Payer: Self-pay | Admitting: *Deleted

## 2013-09-01 ENCOUNTER — Encounter: Payer: Self-pay | Admitting: Internal Medicine

## 2013-09-01 ENCOUNTER — Ambulatory Visit (HOSPITAL_BASED_OUTPATIENT_CLINIC_OR_DEPARTMENT_OTHER): Payer: Medicaid Other | Admitting: Internal Medicine

## 2013-09-01 ENCOUNTER — Ambulatory Visit (HOSPITAL_BASED_OUTPATIENT_CLINIC_OR_DEPARTMENT_OTHER): Payer: Medicaid Other

## 2013-09-01 VITALS — BP 131/72 | HR 81 | Temp 97.7°F | Resp 18 | Ht 72.0 in | Wt 154.9 lb

## 2013-09-01 DIAGNOSIS — C3491 Malignant neoplasm of unspecified part of right bronchus or lung: Secondary | ICD-10-CM

## 2013-09-01 DIAGNOSIS — F17200 Nicotine dependence, unspecified, uncomplicated: Secondary | ICD-10-CM

## 2013-09-01 DIAGNOSIS — J309 Allergic rhinitis, unspecified: Secondary | ICD-10-CM

## 2013-09-01 DIAGNOSIS — Z5111 Encounter for antineoplastic chemotherapy: Secondary | ICD-10-CM

## 2013-09-01 DIAGNOSIS — C3410 Malignant neoplasm of upper lobe, unspecified bronchus or lung: Secondary | ICD-10-CM

## 2013-09-01 DIAGNOSIS — R5381 Other malaise: Secondary | ICD-10-CM

## 2013-09-01 MED ORDER — OMEPRAZOLE 20 MG PO CPDR: 20.0000 mg | DELAYED_RELEASE_CAPSULE | Freq: Two times a day (BID) | ORAL | Status: DC

## 2013-09-01 MED ORDER — ONDANSETRON 8 MG/50ML IVPB (CHCC)
8.0000 mg | Freq: Once | INTRAVENOUS | Status: AC
  Administered 2013-09-01: 8 mg via INTRAVENOUS

## 2013-09-01 MED ORDER — SODIUM CHLORIDE 0.9 % IV SOLN
100.0000 mg/m2 | Freq: Once | INTRAVENOUS | Status: AC
  Administered 2013-09-01: 200 mg via INTRAVENOUS
  Filled 2013-09-01: qty 10

## 2013-09-01 MED ORDER — HEPARIN SOD (PORK) LOCK FLUSH 100 UNIT/ML IV SOLN
500.0000 [IU] | Freq: Once | INTRAVENOUS | Status: AC | PRN
  Administered 2013-09-01: 500 [IU]
  Filled 2013-09-01: qty 5

## 2013-09-01 MED ORDER — SODIUM CHLORIDE 0.9 % IJ SOLN
10.0000 mL | INTRAMUSCULAR | Status: DC | PRN
  Administered 2013-09-01: 10 mL
  Filled 2013-09-01: qty 10

## 2013-09-01 MED ORDER — OXYCODONE-ACETAMINOPHEN 5-325 MG PO TABS: 1.0000 | ORAL_TABLET | Freq: Four times a day (QID) | ORAL | Status: DC | PRN

## 2013-09-01 MED ORDER — SODIUM CHLORIDE 0.9 % IV SOLN
Freq: Once | INTRAVENOUS | Status: AC
  Administered 2013-09-01: 12:00:00 via INTRAVENOUS

## 2013-09-01 MED ORDER — DEXAMETHASONE SODIUM PHOSPHATE 10 MG/ML IJ SOLN
10.0000 mg | Freq: Once | INTRAMUSCULAR | Status: AC
  Administered 2013-09-01: 10 mg via INTRAVENOUS

## 2013-09-01 MED ORDER — ONDANSETRON 8 MG/NS 50 ML IVPB
INTRAVENOUS | Status: AC
  Filled 2013-09-01: qty 8

## 2013-09-01 MED ORDER — DEXAMETHASONE SODIUM PHOSPHATE 10 MG/ML IJ SOLN
INTRAMUSCULAR | Status: AC
  Filled 2013-09-01: qty 1

## 2013-09-01 MED ORDER — LORATADINE 10 MG PO TABS: 10.0000 mg | ORAL_TABLET | Freq: Every day | ORAL | Status: DC | PRN

## 2013-09-01 NOTE — Progress Notes (Signed)
[facility]  [phone] [phone]  PROGRESS NOTE  Saralyn Pilar, DO [address] [place] [address]  DIAGNOSIS: Extensive Stage small cell lung cancer diagnosed in September of 2014.  PRIOR THERAPY: None  CURRENT THERAPY: Systemic chemotherapy with carboplatin for an AUC of 5 given on day 1 and etoposide 120 mg per meter squared given on days 1, 2 and 3 with Neulasta support given on day 4 status post 2 cycles. Carboplatin was reduced to AUC of 4 and etoposide 100 mg/M2 starting from cycle #4.  DISEASE STAGE: Extensive stage  CHEMOTHERAPY INTENT: Palliative  CURRENT # OF CHEMOTHERAPY CYCLES: 5  CURRENT ANTIEMETICS: Zofran, dexamethasone, Compazine  CURRENT SMOKING STATUS: Current smoker, strongly advised to quit smoking  ORAL CHEMOTHERAPY AND CONSENT: N./A.  CURRENT BISPHOSPHONATES USE: None  PAIN MANAGEMENT: Percocet  NARCOTICS INDUCED CONSTIPATION: None  LIVING WILL AND CODE STATUS: ?   INTERVAL HISTORY: William Townsend 52 y.o. male returns for a follow up visit. The patient is tolerating his current treatment with carboplatin and etoposide fairly well with no significant adverse effects except for mild fatigue. He denied having any significant chest pain, shortness of breath, cough or hemoptysis. The patient has no nausea or vomiting. He has no fever or chills. He has repeat CT scan of the chest, abdomen and pelvis performed recently and he is here for evaluation and discussion of his scan results.  MEDICAL HISTORY: Past Medical History  Diagnosis Date  . Atrial fibrillation   . Diabetes mellitus   . 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 Allergy   . Substance abuse   . Depression   . Stroke     3 previous strokes - Oct, Nov, and Dec 2012  . Throat cancer     September 2008  . Lung cancer     lobectomy in 2010  . Heart murmur   . Hx of radiation therapy 05/18/13- 06/01/13    whole brain, 30 Gy, 10 fractions     ALLERGIES:  is allergic to vicodin.  MEDICATIONS:  Current Outpatient Prescriptions  Medication Sig Dispense Refill  . lidocaine-prilocaine (EMLA) cream Apply topically as needed. Apply to port 1 hour before chemotherapy.  30 g  0  . loratadine (CLARITIN) 10 MG tablet Take 1 tablet (10 mg total) by mouth daily as needed for allergies.  30 tablet  0  . oxyCODONE-acetaminophen (PERCOCET/ROXICET) 5-325 MG per tablet Take 1 tablet by mouth every 6 (six) hours as needed.  30 tablet  0  . omeprazole (PRILOSEC) 20 MG capsule Take 1 capsule (20 mg total) by mouth 2 (two) times daily before a meal.  60 capsule  1  . prochlorperazine (COMPAZINE) 10 MG tablet Take 1 tablet (10 mg total) by mouth every 6 (six) hours as needed.  60 tablet  0   No current facility-administered medications for this visit.    SURGICAL HISTORY:  Past Surgical History  Procedure Laterality Date  . Lobectomy    . Craniotomy Left 04/27/2013    Procedure: Left Occipital Craniotomy for resection of tumor with microscope;  Surgeon: Karn Cassis, MD;  Location: [facility];  Service: Neurosurgery;  Laterality: Left;    REVIEW OF SYSTEMS:  Constitutional: positive for fatigue Eyes: negative Ears, nose, mouth, throat, and face: negative Respiratory: negative Cardiovascular: negative Gastrointestinal: negative Genitourinary:negative Integument/breast: negative Hematologic/lymphatic: negative Musculoskeletal:negative Neurological: negative Behavioral/Psych: negative Endocrine: negative Allergic/Immunologic: negative   PHYSICAL EXAMINATION: General appearance: alert, cooperative and no  distress Head: Normocephalic, without obvious abnormality, atraumatic Neck: no adenopathy, no carotid bruit, no JVD, supple, symmetrical, trachea midline and thyroid not enlarged, symmetric, no tenderness/mass/nodules Lymph nodes: Cervical, supraclavicular, and axillary nodes normal. Resp: clear to auscultation  bilaterally Cardio: regular rate and rhythm, S1, S2 normal, no murmur, click, rub or gallop GI: soft, non-tender; bowel sounds normal; no masses,  no organomegaly Extremities: extremities normal, atraumatic, no cyanosis or edema Neurologic: Alert and oriented X 3, normal strength and tone. Normal symmetric reflexes. Normal coordination and gait Mouth: Reveals a poor dentition with  6 remaining teeth. In the left upper posterior gum line there is a tooth with jagged edges The tongue and buccal mucosa appear dry, no evidence of thrush or mucositis  ECOG PERFORMANCE STATUS: 1 - Symptomatic but completely ambulatory  Blood pressure 131/72, pulse 81, temperature 97.7 F (36.5 C), temperature source Oral, resp. rate 18, height 6' (1.829 m), weight 154 lb 14.4 oz (70.262 kg), SpO2 100.00%.  LABORATORY DATA: Lab Results  Component Value Date   WBC 8.9 08/30/2013   HGB 9.0* 08/30/2013   HCT 26.4* 08/30/2013   MCV 100.4* 08/30/2013   PLT 139* 08/30/2013      Chemistry      Component Value Date/Time   NA 139 08/26/2013 1154   NA 136 06/03/2013 1310   K 3.7 08/26/2013 1154   K 3.8 06/03/2013 1310   CL 98 06/03/2013 1310   CO2 25 08/26/2013 1154   CO2 25 06/03/2013 1310   BUN 8.2 08/26/2013 1154   BUN 12 06/03/2013 1310   CREATININE 0.9 08/26/2013 1154   CREATININE 0.91 06/03/2013 1310   CREATININE 1.04 01/28/2012 1454      Component Value Date/Time   CALCIUM 8.8 08/26/2013 1154   CALCIUM 9.0 06/03/2013 1310   ALKPHOS 139 08/26/2013 1154   ALKPHOS 122* 04/21/2013 1041   AST 23 08/26/2013 1154   AST 29 04/21/2013 1041   ALT 20 08/26/2013 1154   ALT 28 04/21/2013 1041   BILITOT 0.52 08/26/2013 1154   BILITOT 0.7 04/21/2013 1041       RADIOGRAPHIC STUDIES: Ct Chest W Contrast  08/31/2013   CLINICAL DATA:  Follow-up extensive stage small-cell lung cancer  EXAM: CT CHEST, ABDOMEN, AND PELVIS WITH CONTRAST  TECHNIQUE: Multidetector CT imaging of the chest, abdomen and pelvis was performed  following the standard protocol during bolus administration of intravenous contrast.  CONTRAST:  OMNIPAQUE IOHEXOL 300 MG/ML  SOLN  COMPARISON:  07/16/2013  FINDINGS:   CT CHEST FINDINGS  Postsurgical changes with brachytherapy seeds in the right upper lobe.  2.0 x 3.1 cm partially cavitary lesion in the posterior left upper lobe (series 5/ image 21), previously 2.2 x 3.3 cm.  No new/suspicious pulmonary nodules. Mild paraseptal emphysematous changes. No pleural effusion or pneumothorax.  Visualized thyroid is unremarkable.  The heart is normal in size. Coronary atherosclerosis in the LAD. Mild atherosclerotic calcifications of the aortic arch.  Right chest port terminating at the cavoatrial junction.  12 mm short axis right hilar node (series 2/ image 26), unchanged. No suspicious mediastinal or axillary lymphadenopathy.  Degenerative changes of the thoracic spine.    CT ABDOMEN AND PELVIS FINDINGS  Multifocal hepatic metastases in both lobes, mildly improved. Index lesions are as follows:  --3.9 x 4.2 cm lesion in the central right hepatic lobe (series 2/ image 56), previously 4.4 x 5.3 cm  --3.5 x 4.1 cm lesion inferiorly in the anterior segment right hepatic lobe (series 2/  image 69), previously 3.9 x 4.8 cm  Spleen is notable for a 3.3 x 2.9 cm metastasis inferiorly (series 2/image 72), previously 4.1 x 3.6 cm.  Pancreas and adrenal glands are within normal limits.  Contracted gallbladder with suspected noncalcified gallstones (series 2/image 70).  Kidneys are within normal limits.  No hydronephrosis.  No evidence of bowel obstruction.  Normal appendix.  Atherosclerotic calcifications of the abdominal aorta and branch vessels.  No abdominopelvic ascites.  No suspicious abdominopelvic lymphadenopathy.  Prostate is unremarkable.  Bladder is within normal limits.  Degenerative changes of the lumbar spine. Stable lytic lesion in the left iliac bone (image 99), favored to be benign.   IMPRESSION: 2.0 x  3.1 cm partially cavitary lesion in the left upper lobe, stable versus mildly decreased.  Improving hepatic metastases, with index lesions as described above.  Improving splenic metastasis.   Electronically Signed   By: Charline Bills M.D.   On: 08/31/2013 12:58    ASSESSMENT/PLAN:  The patient is feeling fine today with no specific complaints. He tolerated the first 4 cycles of his treatment with carboplatin and etoposide fairly well except for mild fatigue.  His recent scan showed continuous improvement in his disease. I discussed the scan results with the patient and recommended for him to continue his chemotherapy with carboplatin and etoposide,  We will proceed with cycle #5 today as scheduled.  He would come back for follow up visit in 3 weeks with the next cycle of his treatment. He was advised to call immediately if he has any concerning symptoms in the interval. All questions were answered. The patient knows to call the clinic with any problems, questions or concerns. We can certainly see the patient much sooner if necessary.   I spent 15 minutes counseling the patient face to face. The total time spent in the appointment was 25 minutes.  Lajuana Matte., MD 09/01/2013

## 2013-09-01 NOTE — Patient Instructions (Signed)
CURRENT THERAPY: Systemic chemotherapy with carboplatin for an AUC of 5 given on day 1 and etoposide 120 mg per meter squared given on days 1, 2 and 3 with Neulasta support given on day 4 status post 2 cycles. Carboplatin was reduced to AUC of 4 and etoposide 100 mg/M2 starting from cycle #4.  DISEASE STAGE: Extensive stage  CHEMOTHERAPY INTENT: Palliative  CURRENT # OF CHEMOTHERAPY CYCLES: 5  CURRENT ANTIEMETICS: Zofran, dexamethasone, Compazine  CURRENT SMOKING STATUS: Current smoker, strongly advised to quit smoking  ORAL CHEMOTHERAPY AND CONSENT: N./A.  CURRENT BISPHOSPHONATES USE: None  PAIN MANAGEMENT: Percocet  NARCOTICS INDUCED CONSTIPATION: None  LIVING WILL AND CODE STATUS: ?

## 2013-09-01 NOTE — Telephone Encounter (Signed)
Per staff message and POF I have scheduled appts.  JMW  

## 2013-09-01 NOTE — Patient Instructions (Signed)
[facility] Discharge Instructions for Patients Receiving Chemotherapy  Today you received the following chemotherapy agent: Etoposide   To help prevent nausea and vomiting after your treatment, we encourage you to take your nausea medication as prescribed.   If you develop nausea and vomiting that is not controlled by your nausea medication, call the clinic.   BELOW ARE SYMPTOMS THAT SHOULD BE REPORTED IMMEDIATELY:  *FEVER GREATER THAN 100.5 F  *CHILLS WITH OR WITHOUT FEVER  NAUSEA AND VOMITING THAT IS NOT CONTROLLED WITH YOUR NAUSEA MEDICATION  *UNUSUAL SHORTNESS OF BREATH  *UNUSUAL BRUISING OR BLEEDING  TENDERNESS IN MOUTH AND THROAT WITH OR WITHOUT PRESENCE OF ULCERS  *URINARY PROBLEMS  *BOWEL PROBLEMS  UNUSUAL RASH Items with * indicate a potential emergency and should be followed up as soon as possible.  Feel free to call the clinic you have any questions or concerns. The clinic phone number is [phone].    

## 2013-09-03 ENCOUNTER — Ambulatory Visit (HOSPITAL_BASED_OUTPATIENT_CLINIC_OR_DEPARTMENT_OTHER): Payer: Medicaid Other

## 2013-09-03 ENCOUNTER — Telehealth: Payer: Self-pay | Admitting: Internal Medicine

## 2013-09-03 VITALS — BP 94/60 | HR 100 | Temp 98.6°F

## 2013-09-03 DIAGNOSIS — C3410 Malignant neoplasm of upper lobe, unspecified bronchus or lung: Secondary | ICD-10-CM

## 2013-09-03 DIAGNOSIS — C3491 Malignant neoplasm of unspecified part of right bronchus or lung: Secondary | ICD-10-CM

## 2013-09-03 DIAGNOSIS — Z5189 Encounter for other specified aftercare: Secondary | ICD-10-CM

## 2013-09-03 MED ORDER — PEGFILGRASTIM INJECTION 6 MG/0.6ML
6.0000 mg | Freq: Once | SUBCUTANEOUS | Status: AC
  Administered 2013-09-03: 6 mg via SUBCUTANEOUS
  Filled 2013-09-03: qty 0.6

## 2013-09-03 NOTE — Telephone Encounter (Signed)
pt stopped by requested I rs 12/29 lab to 12/30 due to transportation issues shh

## 2013-09-06 ENCOUNTER — Other Ambulatory Visit: Payer: Medicaid Other

## 2013-09-07 ENCOUNTER — Encounter (INDEPENDENT_AMBULATORY_CARE_PROVIDER_SITE_OTHER): Payer: Self-pay

## 2013-09-07 ENCOUNTER — Other Ambulatory Visit (HOSPITAL_BASED_OUTPATIENT_CLINIC_OR_DEPARTMENT_OTHER): Payer: Medicaid Other

## 2013-09-07 DIAGNOSIS — C3491 Malignant neoplasm of unspecified part of right bronchus or lung: Secondary | ICD-10-CM

## 2013-09-07 DIAGNOSIS — C3410 Malignant neoplasm of upper lobe, unspecified bronchus or lung: Secondary | ICD-10-CM

## 2013-09-07 LAB — CBC WITH DIFFERENTIAL/PLATELET
Basophils Absolute: 0 10*3/uL (ref 0.0–0.1)
Eosinophils Absolute: 0 10*3/uL (ref 0.0–0.5)
HGB: 7.1 g/dL — ABNORMAL LOW (ref 13.0–17.1)
MCV: 99.3 fL — ABNORMAL HIGH (ref 79.3–98.0)
MONO#: 0.2 10*3/uL (ref 0.1–0.9)
MONO%: 8.4 % (ref 0.0–14.0)
NEUT#: 2.2 10*3/uL (ref 1.5–6.5)
RBC: 2.01 10*6/uL — ABNORMAL LOW (ref 4.20–5.82)
RDW: 16.1 % — ABNORMAL HIGH (ref 11.0–14.6)
WBC: 3 10*3/uL — ABNORMAL LOW (ref 4.0–10.3)
lymph#: 0.5 10*3/uL — ABNORMAL LOW (ref 0.9–3.3)

## 2013-09-07 LAB — COMPREHENSIVE METABOLIC PANEL (CC13)
Albumin: 3.8 g/dL (ref 3.5–5.0)
Alkaline Phosphatase: 126 U/L (ref 40–150)
BUN: 12.6 mg/dL (ref 7.0–26.0)
CO2: 23 mEq/L (ref 22–29)
Calcium: 8.9 mg/dL (ref 8.4–10.4)
Chloride: 97 mEq/L — ABNORMAL LOW (ref 98–109)
Glucose: 466 mg/dl — ABNORMAL HIGH (ref 70–140)
Potassium: 3.5 mEq/L (ref 3.5–5.1)
Sodium: 136 mEq/L (ref 136–145)
Total Protein: 6.6 g/dL (ref 6.4–8.3)

## 2013-09-13 ENCOUNTER — Other Ambulatory Visit: Payer: Medicaid Other

## 2013-09-16 ENCOUNTER — Telehealth: Payer: Self-pay | Admitting: Medical Oncology

## 2013-09-16 MED ORDER — AZITHROMYCIN 250 MG PO TABS: ORAL_TABLET | ORAL | Status: DC

## 2013-09-16 NOTE — Telephone Encounter (Addendum)
Cold and cough congestion. Productive cough " not dark" sputum. Denies fever . Has been in bed the past 4 days until today he finally felt like getting up. Per Dr Julien Nordmann I called in zpak and told pt to call for temp >100.5 f or worsening symptoms. He voiced understanding.

## 2013-09-20 ENCOUNTER — Encounter: Payer: Self-pay | Admitting: Physician Assistant

## 2013-09-20 ENCOUNTER — Ambulatory Visit (HOSPITAL_COMMUNITY)
Admission: RE | Admit: 2013-09-20 | Discharge: 2013-09-20 | Disposition: A | Discharge status: Home / Self Care | Payer: Medicaid Other | Source: Ambulatory Visit | Attending: Internal Medicine | Admitting: Internal Medicine

## 2013-09-20 ENCOUNTER — Other Ambulatory Visit (HOSPITAL_BASED_OUTPATIENT_CLINIC_OR_DEPARTMENT_OTHER): Payer: Medicaid Other

## 2013-09-20 ENCOUNTER — Ambulatory Visit (HOSPITAL_BASED_OUTPATIENT_CLINIC_OR_DEPARTMENT_OTHER): Payer: Medicaid Other

## 2013-09-20 ENCOUNTER — Other Ambulatory Visit: Payer: Medicaid Other

## 2013-09-20 ENCOUNTER — Ambulatory Visit (HOSPITAL_BASED_OUTPATIENT_CLINIC_OR_DEPARTMENT_OTHER): Payer: Medicaid Other | Admitting: Physician Assistant

## 2013-09-20 VITALS — BP 120/71 | HR 90 | Temp 98.8°F | Resp 18 | Ht 72.0 in | Wt 150.4 lb

## 2013-09-20 DIAGNOSIS — D649 Anemia, unspecified: Secondary | ICD-10-CM

## 2013-09-20 DIAGNOSIS — R5381 Other malaise: Secondary | ICD-10-CM

## 2013-09-20 DIAGNOSIS — C3491 Malignant neoplasm of unspecified part of right bronchus or lung: Secondary | ICD-10-CM

## 2013-09-20 DIAGNOSIS — C787 Secondary malignant neoplasm of liver and intrahepatic bile duct: Secondary | ICD-10-CM

## 2013-09-20 DIAGNOSIS — T451X5A Adverse effect of antineoplastic and immunosuppressive drugs, initial encounter: Principal | ICD-10-CM

## 2013-09-20 DIAGNOSIS — Z5111 Encounter for antineoplastic chemotherapy: Secondary | ICD-10-CM

## 2013-09-20 DIAGNOSIS — C3410 Malignant neoplasm of upper lobe, unspecified bronchus or lung: Secondary | ICD-10-CM

## 2013-09-20 DIAGNOSIS — D6481 Anemia due to antineoplastic chemotherapy: Secondary | ICD-10-CM

## 2013-09-20 DIAGNOSIS — C3490 Malignant neoplasm of unspecified part of unspecified bronchus or lung: Secondary | ICD-10-CM

## 2013-09-20 DIAGNOSIS — R5383 Other fatigue: Secondary | ICD-10-CM

## 2013-09-20 DIAGNOSIS — I4891 Unspecified atrial fibrillation: Secondary | ICD-10-CM

## 2013-09-20 LAB — COMPREHENSIVE METABOLIC PANEL (CC13)
ALT: 22 U/L (ref 0–55)
ANION GAP: 14 meq/L — AB (ref 3–11)
AST: 24 U/L (ref 5–34)
Albumin: 3.5 g/dL (ref 3.5–5.0)
Alkaline Phosphatase: 174 U/L — ABNORMAL HIGH (ref 40–150)
BUN: 6.8 mg/dL — ABNORMAL LOW (ref 7.0–26.0)
CALCIUM: 8 mg/dL — AB (ref 8.4–10.4)
CHLORIDE: 100 meq/L (ref 98–109)
CO2: 25 mEq/L (ref 22–29)
CREATININE: 0.9 mg/dL (ref 0.7–1.3)
GLUCOSE: 313 mg/dL — AB (ref 70–140)
Potassium: 3.5 mEq/L (ref 3.5–5.1)
Sodium: 139 mEq/L (ref 136–145)
Total Bilirubin: 0.68 mg/dL (ref 0.20–1.20)
Total Protein: 6.4 g/dL (ref 6.4–8.3)

## 2013-09-20 LAB — CBC WITH DIFFERENTIAL/PLATELET
BASO%: 0.3 % (ref 0.0–2.0)
Basophils Absolute: 0 10*3/uL (ref 0.0–0.1)
EOS ABS: 0 10*3/uL (ref 0.0–0.5)
EOS%: 0.1 % (ref 0.0–7.0)
HCT: 21.4 % — ABNORMAL LOW (ref 38.4–49.9)
HEMOGLOBIN: 7.3 g/dL — AB (ref 13.0–17.1)
LYMPH%: 9.3 % — ABNORMAL LOW (ref 14.0–49.0)
MCH: 33.8 pg — AB (ref 27.2–33.4)
MCHC: 34.1 g/dL (ref 32.0–36.0)
MCV: 99.1 fL — AB (ref 79.3–98.0)
MONO#: 0.9 10*3/uL (ref 0.1–0.9)
MONO%: 8.6 % (ref 0.0–14.0)
NEUT#: 8.6 10*3/uL — ABNORMAL HIGH (ref 1.5–6.5)
NEUT%: 81.7 % — ABNORMAL HIGH (ref 39.0–75.0)
Platelets: 153 10*3/uL (ref 140–400)
RBC: 2.16 10*6/uL — ABNORMAL LOW (ref 4.20–5.82)
RDW: 20.4 % — ABNORMAL HIGH (ref 11.0–14.6)
WBC: 10.5 10*3/uL — ABNORMAL HIGH (ref 4.0–10.3)
lymph#: 1 10*3/uL (ref 0.9–3.3)

## 2013-09-20 LAB — ABO/RH: ABO/RH(D): A POS

## 2013-09-20 LAB — PREPARE RBC (CROSSMATCH)

## 2013-09-20 MED ORDER — SODIUM CHLORIDE 0.9 % IV SOLN
530.0000 mg | Freq: Once | INTRAVENOUS | Status: AC
  Administered 2013-09-20: 530 mg via INTRAVENOUS
  Filled 2013-09-20: qty 53

## 2013-09-20 MED ORDER — DEXAMETHASONE SODIUM PHOSPHATE 20 MG/5ML IJ SOLN
20.0000 mg | Freq: Once | INTRAMUSCULAR | Status: AC
  Administered 2013-09-20: 20 mg via INTRAVENOUS

## 2013-09-20 MED ORDER — ONDANSETRON 16 MG/50ML IVPB (CHCC)
16.0000 mg | Freq: Once | INTRAVENOUS | Status: AC
  Administered 2013-09-20: 16 mg via INTRAVENOUS

## 2013-09-20 MED ORDER — SODIUM CHLORIDE 0.9 % IV SOLN
Freq: Once | INTRAVENOUS | Status: AC
  Administered 2013-09-20: 16:00:00 via INTRAVENOUS

## 2013-09-20 MED ORDER — DEXAMETHASONE SODIUM PHOSPHATE 20 MG/5ML IJ SOLN
INTRAMUSCULAR | Status: AC
  Filled 2013-09-20: qty 5

## 2013-09-20 MED ORDER — SODIUM CHLORIDE 0.9 % IV SOLN
100.0000 mg/m2 | Freq: Once | INTRAVENOUS | Status: AC
  Administered 2013-09-20: 200 mg via INTRAVENOUS
  Filled 2013-09-20: qty 10

## 2013-09-20 MED ORDER — ONDANSETRON 16 MG/50ML IVPB (CHCC)
INTRAVENOUS | Status: AC
  Filled 2013-09-20: qty 16

## 2013-09-20 MED ORDER — HEPARIN SOD (PORK) LOCK FLUSH 100 UNIT/ML IV SOLN
500.0000 [IU] | Freq: Once | INTRAVENOUS | Status: AC | PRN
  Administered 2013-09-20: 500 [IU]
  Filled 2013-09-20: qty 5

## 2013-09-20 MED ORDER — SODIUM CHLORIDE 0.9 % IJ SOLN
10.0000 mL | INTRAMUSCULAR | Status: DC | PRN
  Administered 2013-09-20: 10 mL
  Filled 2013-09-20: qty 10

## 2013-09-20 NOTE — Patient Instructions (Signed)
[facility] Discharge Instructions for Patients Receiving Chemotherapy  Today you received the following chemotherapy agents: carboplatin, etoposide  To help prevent nausea and vomiting after your treatment, we encourage you to take your nausea medication.  Take it as often as prescribed.     If you develop nausea and vomiting that is not controlled by your nausea medication, call the clinic. If it is after clinic hours your family physician or the after hours number for the clinic or go to the Emergency Department.   BELOW ARE SYMPTOMS THAT SHOULD BE REPORTED IMMEDIATELY:  *FEVER GREATER THAN 100.5 F  *CHILLS WITH OR WITHOUT FEVER  NAUSEA AND VOMITING THAT IS NOT CONTROLLED WITH YOUR NAUSEA MEDICATION  *UNUSUAL SHORTNESS OF BREATH  *UNUSUAL BRUISING OR BLEEDING  TENDERNESS IN MOUTH AND THROAT WITH OR WITHOUT PRESENCE OF ULCERS  *URINARY PROBLEMS  *BOWEL PROBLEMS  UNUSUAL RASH Items with * indicate a potential emergency and should be followed up as soon as possible.  Feel free to call the clinic you have any questions or concerns. The clinic phone number is [phone].   I have been informed and understand all the instructions given to me. I know to contact the clinic, my physician, or go to the Emergency Department if any problems should occur. I do not have any questions at this time, but understand that I may call the clinic during office hours   should I have any questions or need assistance in obtaining follow up care.    __________________________________________  _____________  __________ Signature of Patient or Authorized Representative            Date                   Time    __________________________________________ Nurse's Signature

## 2013-09-20 NOTE — Patient Instructions (Signed)
He will be given a blood transfusion to address your chemotherapy-induced anemia Continue with weekly labs as scheduled Follow with Dr. Julien Nordmann in 3 weeks with restaging CT scan of the chest, abdomen and pelvis to reevaluate your disease

## 2013-09-20 NOTE — Progress Notes (Addendum)
William Townsend  [phone] [phone]  SHARED VISIT PROGRESS NOTE  William Putnam, DO [place] [address]  DIAGNOSIS: Extensive Stage small cell lung cancer diagnosed in September of 2014.  PRIOR THERAPY: None  CURRENT THERAPY: Systemic chemotherapy with carboplatin for an AUC of 5 given on day 1 and etoposide 120 mg per meter squared given on days 1, 2 and 3 with Neulasta support given on day 4 status post 5 cycles. Carboplatin was reduced to AUC of 4 and etoposide 100 mg/M2 starting from cycle #4.  DISEASE STAGE: Extensive stage  CHEMOTHERAPY INTENT: Palliative  CURRENT # OF CHEMOTHERAPY CYCLES: 6  CURRENT ANTIEMETICS: Zofran, dexamethasone, Compazine  CURRENT SMOKING STATUS: Current smoker, strongly advised to quit smoking  ORAL CHEMOTHERAPY AND CONSENT: N./A.  CURRENT BISPHOSPHONATES USE: None  PAIN MANAGEMENT: Percocet  NARCOTICS INDUCED CONSTIPATION: None  LIVING WILL AND CODE STATUS: ?   INTERVAL HISTORY: William Townsend 53 y.o. male returns for a follow up visit. The patient is tolerating his current treatment with carboplatin and etoposide fairly well. He complains of increased fatigue. He also reports a bout with bronchitis that started approximately 2 weeks ago. He states that he felt very tired and cold. He was exposed to a brother with pneumonia. He had a productive cough and scratchy throat. He was placed on a Z-Pak and states that he is gradually feeling better. He reports during the beginning of his illness that he had  Significant loss of appetite. His appetite has improved recently.He denies any fever or chills.He denied having any significant chest pain, shortness of breath, cough or hemoptysis. The patient has no nausea or vomiting. He has no fever or chills.   MEDICAL HISTORY: Past Medical History  Diagnosis Date  . Atrial fibrillation   . Diabetes mellitus   . 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 Allergy   . Substance abuse   . Depression   . Stroke     3 previous strokes - Oct, Nov, and Dec 2012  . Throat cancer     September 2008  . Lung cancer     lobectomy in 2010  . Heart murmur   . Hx of radiation therapy 05/18/13- 06/01/13    whole brain, 30 Gy, 10 fractions    ALLERGIES:  is allergic to vicodin.  MEDICATIONS:  Current Outpatient Prescriptions  Medication Sig Dispense Refill  . azithromycin (ZITHROMAX Z-PAK) 250 MG tablet Take as directed.  6 each  0  . lidocaine-prilocaine (EMLA) cream Apply topically as needed. Apply to port 1 hour before chemotherapy.  30 g  0  . loratadine (CLARITIN) 10 MG tablet Take 1 tablet (10 mg total) by mouth daily as needed for allergies.  30 tablet  0  . omeprazole (PRILOSEC) 20 MG capsule Take 1 capsule (20 mg total) by mouth 2 (two) times daily before a meal.  60 capsule  1  . oxyCODONE-acetaminophen (PERCOCET/ROXICET) 5-325 MG per tablet Take 1 tablet by mouth every 6 (six) hours as needed.  30 tablet  0  . prochlorperazine (COMPAZINE) 10 MG tablet Take 1 tablet (10 mg total) by mouth every 6 (six) hours as needed.  60 tablet  0   No current facility-administered medications for this visit.   Facility-Administered Medications Ordered in Other Visits  Medication Dose Route Frequency Provider Last Rate Last Dose  . etoposide (VEPESID) 200 mg in sodium chloride 0.9 % 500 mL chemo infusion  100 mg/m2 (  Treatment Plan Actual) Intravenous Once Si Gaul, MD 510 mL/hr at 09/20/13 1700 200 mg at 09/20/13 1700  . heparin lock flush 100 unit/mL  500 Units Intracatheter Once PRN Si Gaul, MD      . sodium chloride 0.9 % injection 10 mL  10 mL Intracatheter PRN Si Gaul, MD        SURGICAL HISTORY:  Past Surgical History  Procedure Laterality Date  . Lobectomy    . Craniotomy Left 04/27/2013    Procedure: Left Occipital Craniotomy for resection of tumor with microscope;  Surgeon: Karn Cassis, MD;  Location: [facility];  Service: Neurosurgery;  Laterality: Left;    REVIEW OF SYSTEMS:  Constitutional: positive for anorexia and fatigue Eyes: negative Ears, nose, mouth, throat, and face: negative Respiratory: positive for cough Cardiovascular: negative Gastrointestinal: negative Genitourinary:negative Integument/breast: negative Hematologic/lymphatic: negative Musculoskeletal:negative Neurological: negative Behavioral/Psych: negative Endocrine: negative Allergic/Immunologic: negative   PHYSICAL EXAMINATION: General appearance: alert, cooperative and no distress Head: Normocephalic, without obvious abnormality, atraumatic Neck: no adenopathy, no carotid bruit, no JVD, supple, symmetrical, trachea midline and thyroid not enlarged, symmetric, no tenderness/mass/nodules Lymph nodes: Cervical, supraclavicular, and axillary nodes normal. Resp: clear to auscultation bilaterally Cardio: regular rate and rhythm, S1, S2 normal, no murmur, click, rub or gallop GI: soft, non-tender; bowel sounds normal; no masses,  no organomegaly Extremities: extremities normal, atraumatic, no cyanosis or edema Neurologic: Alert and oriented X 3, normal strength and tone. Normal symmetric reflexes. Normal coordination and gait Mouth: Reveals a poor dentition The tongue and buccal mucosa appear dry, no evidence of thrush or mucositis  ECOG PERFORMANCE STATUS: 1 - Symptomatic but completely ambulatory  Blood pressure 120/71, pulse 90, temperature 98.8 F (37.1 C), temperature source Oral, resp. rate 18, height 6' (1.829 m), weight 150 lb 6.4 oz (68.221 kg).  LABORATORY DATA: Lab Results  Component Value Date   WBC 10.5* 09/20/2013   HGB 7.3* 09/20/2013   HCT 21.4* 09/20/2013   MCV 99.1* 09/20/2013   PLT 153 09/20/2013      Chemistry      Component Value Date/Time   NA 139 09/20/2013 1337   NA 136 06/03/2013 1310   K 3.5 09/20/2013 1337   K 3.8 06/03/2013 1310   CL 98 06/03/2013 1310   CO2 25 09/20/2013 1337   CO2 25  06/03/2013 1310   BUN 6.8* 09/20/2013 1337   BUN 12 06/03/2013 1310   CREATININE 0.9 09/20/2013 1337   CREATININE 0.91 06/03/2013 1310   CREATININE 1.04 01/28/2012 1454      Component Value Date/Time   CALCIUM 8.0* 09/20/2013 1337   CALCIUM 9.0 06/03/2013 1310   ALKPHOS 174* 09/20/2013 1337   ALKPHOS 122* 04/21/2013 1041   AST 24 09/20/2013 1337   AST 29 04/21/2013 1041   ALT 22 09/20/2013 1337   ALT 28 04/21/2013 1041   BILITOT 0.68 09/20/2013 1337   BILITOT 0.7 04/21/2013 1041       RADIOGRAPHIC STUDIES: Ct Chest W Contrast  08/31/2013   CLINICAL DATA:  Follow-up extensive stage small-cell lung cancer  EXAM: CT CHEST, ABDOMEN, AND PELVIS WITH CONTRAST  TECHNIQUE: Multidetector CT imaging of the chest, abdomen and pelvis was performed following the standard protocol during bolus administration of intravenous contrast.  CONTRAST:  OMNIPAQUE IOHEXOL 300 MG/ML  SOLN  COMPARISON:  07/16/2013  FINDINGS:   CT CHEST FINDINGS  Postsurgical changes with brachytherapy seeds in the right upper lobe.  2.0 x 3.1 cm partially cavitary lesion in the  posterior left upper lobe (series 5/ image 21), previously 2.2 x 3.3 cm.  No new/suspicious pulmonary nodules. Mild paraseptal emphysematous changes. No pleural effusion or pneumothorax.  Visualized thyroid is unremarkable.  The heart is normal in size. Coronary atherosclerosis in the LAD. Mild atherosclerotic calcifications of the aortic arch.  Right chest port terminating at the cavoatrial junction.  12 mm short axis right hilar node (series 2/ image 26), unchanged. No suspicious mediastinal or axillary lymphadenopathy.  Degenerative changes of the thoracic spine.    CT ABDOMEN AND PELVIS FINDINGS  Multifocal hepatic metastases in both lobes, mildly improved. Index lesions are as follows:  --3.9 x 4.2 cm lesion in the central right hepatic lobe (series 2/ image 56), previously 4.4 x 5.3 cm  --3.5 x 4.1 cm lesion inferiorly in the anterior segment right hepatic  lobe (series 2/ image 69), previously 3.9 x 4.8 cm  Spleen is notable for a 3.3 x 2.9 cm metastasis inferiorly (series 2/image 72), previously 4.1 x 3.6 cm.  Pancreas and adrenal glands are within normal limits.  Contracted gallbladder with suspected noncalcified gallstones (series 2/image 70).  Kidneys are within normal limits.  No hydronephrosis.  No evidence of bowel obstruction.  Normal appendix.  Atherosclerotic calcifications of the abdominal aorta and branch vessels.  No abdominopelvic ascites.  No suspicious abdominopelvic lymphadenopathy.  Prostate is unremarkable.  Bladder is within normal limits.  Degenerative changes of the lumbar spine. Stable lytic lesion in the left iliac bone (image 99), favored to be benign.   IMPRESSION: 2.0 x 3.1 cm partially cavitary lesion in the left upper lobe, stable versus mildly decreased.  Improving hepatic metastases, with index lesions as described above.  Improving splenic metastasis.   Electronically Signed   By: Julian Hy M.D.   On: 08/31/2013 12:58    ASSESSMENT/PLAN:  The patient is feeling fine today with no specific complaints. He tolerated the first 5 cycles of his treatment with carboplatin and etoposide fairly well except for mild fatigue.  His recent scan showed continuous improvement in his disease. Patient was discussed with also seen by Dr. Julien Nordmann. His hemoglobin today of 7.3 is likely related to chemotherapy-induced anemia. Will arrange to transfuse him a total of 2 units of packed blood cells. The patient is in agreement with this plan. We will proceed with cycle #6 of his systemic chemotherapy with reduced dose carboplatin and etoposide with Neulasta support. He'll followup with Dr. Julien Nordmann in 3 weeks with repeat CBC differential, C. met and CT of the chest, abdomen and pelvis to reevaluate his disease. He'll continue with weekly labs consisting of a CBC differential and C. met in the interim.  He was advised to call immediately if he has  any concerning symptoms in the interval. All questions were answered. The patient knows to call the clinic with any problems, questions or concerns. We can certainly see the patient much sooner if necessary.    Carlton Adam, PA-C 09/20/2013  Addendum:  Hematology/Oncology Attending: I had a face-to-face encounter with the patient. I recommended his care plan. This is a very pleasant 53 years old white male with extensive stage small cell lung cancer currently on systemic chemotherapy with carboplatin and etoposide status post 5 cycles. The patient is tolerating his treatment fairly well except for increasing fatigue. He was also treated recently for acute bronchitis with Z-Pak and has significant improvement in his condition. I recommended for the patient to proceed with his chemotherapy today as scheduled. For the chemotherapy-induced  anemia I will arrange for the patient to have 2 units of PRBCs transfusion. He would come back for followup visit in 3 weeks after repeating CT scan of the chest, abdomen and pelvis with restaging of his disease. The patient was advised to call immediately if he has any concerning symptoms in the interval. Eilleen Kempf., MD 09/21/2013

## 2013-09-20 NOTE — Progress Notes (Signed)
OK to treat with Hgb 7.1 per Dr. Julien Nordmann.  Pt to receive two units of blood this week.

## 2013-09-21 ENCOUNTER — Ambulatory Visit (HOSPITAL_BASED_OUTPATIENT_CLINIC_OR_DEPARTMENT_OTHER): Payer: Medicaid Other

## 2013-09-21 ENCOUNTER — Other Ambulatory Visit: Payer: Self-pay | Admitting: *Deleted

## 2013-09-21 VITALS — BP 105/66 | HR 75 | Temp 97.2°F | Resp 20

## 2013-09-21 DIAGNOSIS — D649 Anemia, unspecified: Secondary | ICD-10-CM

## 2013-09-21 DIAGNOSIS — C787 Secondary malignant neoplasm of liver and intrahepatic bile duct: Secondary | ICD-10-CM

## 2013-09-21 DIAGNOSIS — C3491 Malignant neoplasm of unspecified part of right bronchus or lung: Secondary | ICD-10-CM

## 2013-09-21 DIAGNOSIS — C3410 Malignant neoplasm of upper lobe, unspecified bronchus or lung: Secondary | ICD-10-CM

## 2013-09-21 DIAGNOSIS — Z5111 Encounter for antineoplastic chemotherapy: Secondary | ICD-10-CM

## 2013-09-21 MED ORDER — ACETAMINOPHEN 325 MG PO TABS
650.0000 mg | ORAL_TABLET | Freq: Once | ORAL | Status: AC
  Administered 2013-09-21: 650 mg via ORAL

## 2013-09-21 MED ORDER — ACETAMINOPHEN 325 MG PO TABS
ORAL_TABLET | ORAL | Status: AC
  Filled 2013-09-21: qty 2

## 2013-09-21 MED ORDER — SODIUM CHLORIDE 0.9 % IJ SOLN
10.0000 mL | INTRAMUSCULAR | Status: DC | PRN
  Administered 2013-09-21: 10 mL
  Filled 2013-09-21: qty 10

## 2013-09-21 MED ORDER — HEPARIN SOD (PORK) LOCK FLUSH 100 UNIT/ML IV SOLN
500.0000 [IU] | Freq: Once | INTRAVENOUS | Status: AC | PRN
  Administered 2013-09-21: 500 [IU]
  Filled 2013-09-21: qty 5

## 2013-09-21 MED ORDER — SODIUM CHLORIDE 0.9 % IV SOLN
100.0000 mg/m2 | Freq: Once | INTRAVENOUS | Status: AC
  Administered 2013-09-21: 200 mg via INTRAVENOUS
  Filled 2013-09-21: qty 10

## 2013-09-21 MED ORDER — DEXAMETHASONE SODIUM PHOSPHATE 10 MG/ML IJ SOLN
10.0000 mg | Freq: Once | INTRAMUSCULAR | Status: AC
  Administered 2013-09-21: 10 mg via INTRAVENOUS

## 2013-09-21 MED ORDER — ONDANSETRON 8 MG/NS 50 ML IVPB
INTRAVENOUS | Status: AC
  Filled 2013-09-21: qty 8

## 2013-09-21 MED ORDER — DIPHENHYDRAMINE HCL 25 MG PO CAPS
ORAL_CAPSULE | ORAL | Status: AC
  Filled 2013-09-21: qty 1

## 2013-09-21 MED ORDER — DIPHENHYDRAMINE HCL 25 MG PO CAPS
25.0000 mg | ORAL_CAPSULE | Freq: Once | ORAL | Status: AC
  Administered 2013-09-21: 25 mg via ORAL

## 2013-09-21 MED ORDER — DEXAMETHASONE SODIUM PHOSPHATE 10 MG/ML IJ SOLN
INTRAMUSCULAR | Status: AC
  Filled 2013-09-21: qty 1

## 2013-09-21 MED ORDER — SODIUM CHLORIDE 0.9 % IV SOLN
Freq: Once | INTRAVENOUS | Status: AC
  Administered 2013-09-21: 13:00:00 via INTRAVENOUS

## 2013-09-21 MED ORDER — ONDANSETRON 8 MG/50ML IVPB (CHCC)
8.0000 mg | Freq: Once | INTRAVENOUS | Status: AC
  Administered 2013-09-21: 8 mg via INTRAVENOUS

## 2013-09-21 NOTE — Patient Instructions (Addendum)
[facility] Discharge Instructions for Patients Receiving Chemotherapy  Today you received the following chemotherapy agents Etoposide.  To help prevent nausea and vomiting after your treatment, we encourage you to take your nausea medication as prescribed.   If you develop nausea and vomiting that is not controlled by your nausea medication, call the clinic.   BELOW ARE SYMPTOMS THAT SHOULD BE REPORTED IMMEDIATELY:  *FEVER GREATER THAN 100.5 F  *CHILLS WITH OR WITHOUT FEVER  NAUSEA AND VOMITING THAT IS NOT CONTROLLED WITH YOUR NAUSEA MEDICATION  *UNUSUAL SHORTNESS OF BREATH  *UNUSUAL BRUISING OR BLEEDING  TENDERNESS IN MOUTH AND THROAT WITH OR WITHOUT PRESENCE OF ULCERS  *URINARY PROBLEMS  *BOWEL PROBLEMS  UNUSUAL RASH Items with * indicate a potential emergency and should be followed up as soon as possible.  Feel free to call the clinic you have any questions or concerns. The clinic phone number is [phone].   Blood Transfusion Information WHAT IS A BLOOD TRANSFUSION? A transfusion is the replacement of blood or some of its parts. Blood is made up of multiple cells which provide different functions.  Red blood cells carry oxygen and are used for blood loss replacement.  White blood cells fight against infection.  Platelets control bleeding.  Plasma helps clot blood.  Other blood products are available for specialized needs, such as hemophilia or other clotting disorders. BEFORE THE TRANSFUSION  Who gives blood for transfusions?   You may be able to donate blood to be used at a later date on yourself (autologous donation).  Relatives can be asked to donate blood. This is generally not any safer than if you have received blood from a stranger. The same precautions are taken to ensure safety when a relative's blood is donated.  Healthy volunteers who are fully evaluated to make sure their blood is safe. This is blood bank blood. Transfusion  therapy is the safest it has ever been in the practice of medicine. Before blood is taken from a donor, a complete history is taken to make sure that person has no history of diseases nor engages in risky social behavior (examples are intravenous drug use or sexual activity with multiple partners). The donor's travel history is screened to minimize risk of transmitting infections, such as malaria. The donated blood is tested for signs of infectious diseases, such as HIV and hepatitis. The blood is then tested to be sure it is compatible with you in order to minimize the chance of a transfusion reaction. If you or a relative donates blood, this is often done in anticipation of surgery and is not appropriate for emergency situations. It takes many days to process the donated blood. RISKS AND COMPLICATIONS Although transfusion therapy is very safe and saves many lives, the main dangers of transfusion include:   Getting an infectious disease.  Developing a transfusion reaction. This is an allergic reaction to something in the blood you were given. Every precaution is taken to prevent this. The decision to have a blood transfusion has been considered carefully by your caregiver before blood is given. Blood is not given unless the benefits outweigh the risks. AFTER THE TRANSFUSION  Right after receiving a blood transfusion, you will usually feel much better and more energetic. This is especially true if your red blood cells have gotten low (anemic). The transfusion raises the level of the red blood cells which carry oxygen, and this usually causes an energy increase.  The nurse administering the transfusion will monitor you carefully  for complications. HOME CARE INSTRUCTIONS  No special instructions are needed after a transfusion. You may find your energy is better. Speak with your caregiver about any limitations on activity for underlying diseases you may have. SEEK MEDICAL CARE IF:   Your condition is  not improving after your transfusion.  You develop redness or irritation at the intravenous (IV) site. SEEK IMMEDIATE MEDICAL CARE IF:  Any of the following symptoms occur over the next 12 hours:  Shaking chills.  You have a temperature by mouth above 102 F (38.9 C), not controlled by medicine.  Chest, back, or muscle pain.  People around you feel you are not acting correctly or are confused.  Shortness of breath or difficulty breathing.  Dizziness and fainting.  You get a rash or develop hives.  You have a decrease in urine output.  Your urine turns a dark color or changes to pink, red, or brown. Any of the following symptoms occur over the next 10 days:  You have a temperature by mouth above 102 F (38.9 C), not controlled by medicine.  Shortness of breath.  Weakness after normal activity.  The white part of the eye turns yellow (jaundice).  You have a decrease in the amount of urine or are urinating less often.  Your urine turns a dark color or changes to pink, red, or brown. Document Released: 08/23/2000 Document Revised: 11/18/2011 Document Reviewed: 04/11/2008 [facility] Patient Information 2014 [place].

## 2013-09-22 ENCOUNTER — Telehealth: Payer: Self-pay | Admitting: *Deleted

## 2013-09-22 ENCOUNTER — Ambulatory Visit (HOSPITAL_BASED_OUTPATIENT_CLINIC_OR_DEPARTMENT_OTHER): Payer: Medicaid Other

## 2013-09-22 VITALS — BP 117/71 | HR 74 | Temp 97.1°F | Resp 18

## 2013-09-22 DIAGNOSIS — C3410 Malignant neoplasm of upper lobe, unspecified bronchus or lung: Secondary | ICD-10-CM

## 2013-09-22 DIAGNOSIS — C3490 Malignant neoplasm of unspecified part of unspecified bronchus or lung: Secondary | ICD-10-CM

## 2013-09-22 DIAGNOSIS — Z5111 Encounter for antineoplastic chemotherapy: Secondary | ICD-10-CM

## 2013-09-22 DIAGNOSIS — D649 Anemia, unspecified: Secondary | ICD-10-CM

## 2013-09-22 DIAGNOSIS — C787 Secondary malignant neoplasm of liver and intrahepatic bile duct: Secondary | ICD-10-CM

## 2013-09-22 DIAGNOSIS — C3491 Malignant neoplasm of unspecified part of right bronchus or lung: Secondary | ICD-10-CM

## 2013-09-22 MED ORDER — DIPHENHYDRAMINE HCL 25 MG PO CAPS
ORAL_CAPSULE | ORAL | Status: AC
  Filled 2013-09-22: qty 1

## 2013-09-22 MED ORDER — ONDANSETRON 8 MG/50ML IVPB (CHCC)
8.0000 mg | Freq: Once | INTRAVENOUS | Status: AC
  Administered 2013-09-22: 8 mg via INTRAVENOUS

## 2013-09-22 MED ORDER — ACETAMINOPHEN 325 MG PO TABS
650.0000 mg | ORAL_TABLET | Freq: Once | ORAL | Status: AC
  Administered 2013-09-22: 650 mg via ORAL

## 2013-09-22 MED ORDER — DEXAMETHASONE SODIUM PHOSPHATE 10 MG/ML IJ SOLN
INTRAMUSCULAR | Status: AC
  Filled 2013-09-22: qty 1

## 2013-09-22 MED ORDER — SODIUM CHLORIDE 0.9 % IJ SOLN
10.0000 mL | INTRAMUSCULAR | Status: DC | PRN
  Administered 2013-09-22: 10 mL
  Filled 2013-09-22: qty 10

## 2013-09-22 MED ORDER — DEXAMETHASONE SODIUM PHOSPHATE 10 MG/ML IJ SOLN
10.0000 mg | Freq: Once | INTRAMUSCULAR | Status: AC
  Administered 2013-09-22: 10 mg via INTRAVENOUS

## 2013-09-22 MED ORDER — SODIUM CHLORIDE 0.9 % IV SOLN
Freq: Once | INTRAVENOUS | Status: AC
  Administered 2013-09-22: 12:00:00 via INTRAVENOUS

## 2013-09-22 MED ORDER — ETOPOSIDE CHEMO INJECTION 1 GM/50ML
100.0000 mg/m2 | Freq: Once | INTRAVENOUS | Status: AC
  Administered 2013-09-22: 200 mg via INTRAVENOUS
  Filled 2013-09-22: qty 10

## 2013-09-22 MED ORDER — HEPARIN SOD (PORK) LOCK FLUSH 100 UNIT/ML IV SOLN
500.0000 [IU] | Freq: Once | INTRAVENOUS | Status: AC | PRN
  Administered 2013-09-22: 500 [IU]
  Filled 2013-09-22: qty 5

## 2013-09-22 MED ORDER — ONDANSETRON 8 MG/NS 50 ML IVPB
INTRAVENOUS | Status: AC
  Filled 2013-09-22: qty 8

## 2013-09-22 MED ORDER — DIPHENHYDRAMINE HCL 25 MG PO CAPS
25.0000 mg | ORAL_CAPSULE | Freq: Once | ORAL | Status: AC
  Administered 2013-09-22: 25 mg via ORAL

## 2013-09-22 MED ORDER — ACETAMINOPHEN 325 MG PO TABS
ORAL_TABLET | ORAL | Status: AC
  Filled 2013-09-22: qty 2

## 2013-09-22 NOTE — Telephone Encounter (Signed)
Pt is aware of his CT on 10/08/13 and that hes NPO starting @ 4am and need to drink the first bottle of contrast @ 6:30am and the other @ 7:30am..hes aware to arrive @ 8:15am here @ [place]...Marland KitchenMarland Kitchentd

## 2013-09-22 NOTE — Patient Instructions (Addendum)
[place] Discharge Instructions for Patients Receiving Chemotherapy  Today you received the following chemotherapy agents: Etoposide.   To help prevent nausea and vomiting after your treatment, we encourage you to take your nausea medication: Compazine 10 mg every 6 hrs as needed.   If you develop nausea and vomiting that is not controlled by your nausea medication, call the clinic.   BELOW ARE SYMPTOMS THAT SHOULD BE REPORTED IMMEDIATELY:  *FEVER GREATER THAN 100.5 F  *CHILLS WITH OR WITHOUT FEVER  NAUSEA AND VOMITING THAT IS NOT CONTROLLED WITH YOUR NAUSEA MEDICATION  *UNUSUAL SHORTNESS OF BREATH  *UNUSUAL BRUISING OR BLEEDING  TENDERNESS IN MOUTH AND THROAT WITH OR WITHOUT PRESENCE OF ULCERS  *URINARY PROBLEMS  *BOWEL PROBLEMS  UNUSUAL RASH Items with * indicate a potential emergency and should be followed up as soon as possible.  Feel free to call the clinic you have any questions or concerns. The clinic phone number is [phone].  Blood Transfusion Information WHAT IS A BLOOD TRANSFUSION? A transfusion is the replacement of blood or some of its parts. Blood is made up of multiple cells which provide different functions.  Red blood cells carry oxygen and are used for blood loss replacement.  White blood cells fight against infection.  Platelets control bleeding.  Plasma helps clot blood.  Other blood products are available for specialized needs, such as hemophilia or other clotting disorders. BEFORE THE TRANSFUSION  Who gives blood for transfusions?   You may be able to donate blood to be used at a later date on yourself (autologous donation).  Relatives can be asked to donate blood. This is generally not any safer than if you have received blood from a stranger. The same precautions are taken to ensure safety when a relative's blood is donated.  Healthy volunteers who are fully evaluated to make sure their blood is safe. This is blood  bank blood. Transfusion therapy is the safest it has ever been in the practice of medicine. Before blood is taken from a donor, a complete history is taken to make sure that person has no history of diseases nor engages in risky social behavior (examples are intravenous drug use or sexual activity with multiple partners). The donor's travel history is screened to minimize risk of transmitting infections, such as malaria. The donated blood is tested for signs of infectious diseases, such as HIV and hepatitis. The blood is then tested to be sure it is compatible with you in order to minimize the chance of a transfusion reaction. If you or a relative donates blood, this is often done in anticipation of surgery and is not appropriate for emergency situations. It takes many days to process the donated blood. RISKS AND COMPLICATIONS Although transfusion therapy is very safe and saves many lives, the main dangers of transfusion include:   Getting an infectious disease.  Developing a transfusion reaction. This is an allergic reaction to something in the blood you were given. Every precaution is taken to prevent this. The decision to have a blood transfusion has been considered carefully by your caregiver before blood is given. Blood is not given unless the benefits outweigh the risks. AFTER THE TRANSFUSION  Right after receiving a blood transfusion, you will usually feel much better and more energetic. This is especially true if your red blood cells have gotten low (anemic). The transfusion raises the level of the red blood cells which carry oxygen, and this usually causes an energy increase.  The nurse administering  the transfusion will monitor you carefully for complications. HOME CARE INSTRUCTIONS  No special instructions are needed after a transfusion. You may find your energy is better. Speak with your caregiver about any limitations on activity for underlying diseases you may have. SEEK MEDICAL CARE  IF:   Your condition is not improving after your transfusion.  You develop redness or irritation at the intravenous (IV) site. SEEK IMMEDIATE MEDICAL CARE IF:  Any of the following symptoms occur over the next 12 hours:  Shaking chills.  You have a temperature by mouth above 102 F (38.9 C), not controlled by medicine.  Chest, back, or muscle pain.  People around you feel you are not acting correctly or are confused.  Shortness of breath or difficulty breathing.  Dizziness and fainting.  You get a rash or develop hives.  You have a decrease in urine output.  Your urine turns a dark color or changes to pink, red, or brown. Any of the following symptoms occur over the next 10 days:  You have a temperature by mouth above 102 F (38.9 C), not controlled by medicine.  Shortness of breath.  Weakness after normal activity.  The white part of the eye turns yellow (jaundice).  You have a decrease in the amount of urine or are urinating less often.  Your urine turns a dark color or changes to pink, red, or brown. Document Released: 08/23/2000 Document Revised: 11/18/2011 Document Reviewed: 04/11/2008 [facility] Patient Information 2014 [place].

## 2013-09-22 NOTE — Telephone Encounter (Signed)
appts made and printed. Pt is aware that William Townsend messages and ask that i call her and give his cell# to get him scheduled for CT chest/abd/and pelvis...td

## 2013-09-23 ENCOUNTER — Ambulatory Visit (HOSPITAL_BASED_OUTPATIENT_CLINIC_OR_DEPARTMENT_OTHER): Payer: Medicaid Other

## 2013-09-23 VITALS — BP 129/76 | HR 96 | Temp 98.5°F

## 2013-09-23 DIAGNOSIS — Z5189 Encounter for other specified aftercare: Secondary | ICD-10-CM

## 2013-09-23 DIAGNOSIS — C3490 Malignant neoplasm of unspecified part of unspecified bronchus or lung: Secondary | ICD-10-CM

## 2013-09-23 DIAGNOSIS — C3410 Malignant neoplasm of upper lobe, unspecified bronchus or lung: Secondary | ICD-10-CM

## 2013-09-23 DIAGNOSIS — C787 Secondary malignant neoplasm of liver and intrahepatic bile duct: Secondary | ICD-10-CM

## 2013-09-23 LAB — TYPE AND SCREEN
ABO/RH(D): A POS
Antibody Screen: NEGATIVE
UNIT DIVISION: 0
Unit division: 0

## 2013-09-23 MED ORDER — PEGFILGRASTIM INJECTION 6 MG/0.6ML
6.0000 mg | Freq: Once | SUBCUTANEOUS | Status: AC
  Administered 2013-09-23: 6 mg via SUBCUTANEOUS
  Filled 2013-09-23: qty 0.6

## 2013-09-27 ENCOUNTER — Telehealth: Payer: Self-pay | Admitting: Internal Medicine

## 2013-09-27 ENCOUNTER — Other Ambulatory Visit: Payer: Medicaid Other

## 2013-09-27 NOTE — Telephone Encounter (Signed)
pt called and needed to r/s appt due to transportation problems

## 2013-09-29 ENCOUNTER — Other Ambulatory Visit (HOSPITAL_BASED_OUTPATIENT_CLINIC_OR_DEPARTMENT_OTHER): Payer: Medicaid Other

## 2013-09-29 ENCOUNTER — Telehealth: Payer: Self-pay | Admitting: Medical Oncology

## 2013-09-29 ENCOUNTER — Other Ambulatory Visit: Payer: Self-pay | Admitting: Medical Oncology

## 2013-09-29 DIAGNOSIS — D649 Anemia, unspecified: Secondary | ICD-10-CM

## 2013-09-29 DIAGNOSIS — C3490 Malignant neoplasm of unspecified part of unspecified bronchus or lung: Secondary | ICD-10-CM

## 2013-09-29 DIAGNOSIS — C3491 Malignant neoplasm of unspecified part of right bronchus or lung: Secondary | ICD-10-CM

## 2013-09-29 DIAGNOSIS — C3410 Malignant neoplasm of upper lobe, unspecified bronchus or lung: Secondary | ICD-10-CM

## 2013-09-29 LAB — CBC WITH DIFFERENTIAL/PLATELET
BASO%: 0 % (ref 0.0–2.0)
Basophils Absolute: 0 10*3/uL (ref 0.0–0.1)
EOS ABS: 0 10*3/uL (ref 0.0–0.5)
EOS%: 0 % (ref 0.0–7.0)
HCT: 20.3 % — ABNORMAL LOW (ref 38.4–49.9)
HGB: 7.2 g/dL — ABNORMAL LOW (ref 13.0–17.1)
LYMPH#: 0.6 10*3/uL — AB (ref 0.9–3.3)
LYMPH%: 20.8 % (ref 14.0–49.0)
MCH: 32.1 pg (ref 27.2–33.4)
MCHC: 35.5 g/dL (ref 32.0–36.0)
MCV: 90.6 fL (ref 79.3–98.0)
MONO#: 0.3 10*3/uL (ref 0.1–0.9)
MONO%: 11.3 % (ref 0.0–14.0)
NEUT#: 2 10*3/uL (ref 1.5–6.5)
NEUT%: 67.9 % (ref 39.0–75.0)
Platelets: 30 10*3/uL — ABNORMAL LOW (ref 140–400)
RBC: 2.24 10*6/uL — ABNORMAL LOW (ref 4.20–5.82)
RDW: 17 % — ABNORMAL HIGH (ref 11.0–14.6)
WBC: 2.9 10*3/uL — AB (ref 4.0–10.3)

## 2013-09-29 LAB — COMPREHENSIVE METABOLIC PANEL (CC13)
ALT: 30 U/L (ref 0–55)
ANION GAP: 13 meq/L — AB (ref 3–11)
AST: 24 U/L (ref 5–34)
Albumin: 3.9 g/dL (ref 3.5–5.0)
Alkaline Phosphatase: 136 U/L (ref 40–150)
BUN: 16.9 mg/dL (ref 7.0–26.0)
CALCIUM: 9.3 mg/dL (ref 8.4–10.4)
CO2: 25 meq/L (ref 22–29)
CREATININE: 0.9 mg/dL (ref 0.7–1.3)
Chloride: 97 mEq/L — ABNORMAL LOW (ref 98–109)
GLUCOSE: 360 mg/dL — AB (ref 70–140)
Potassium: 3.3 mEq/L — ABNORMAL LOW (ref 3.5–5.1)
Sodium: 134 mEq/L — ABNORMAL LOW (ref 136–145)
Total Bilirubin: 1.16 mg/dL (ref 0.20–1.20)
Total Protein: 7.2 g/dL (ref 6.4–8.3)

## 2013-09-29 MED ORDER — LEVOFLOXACIN 500 MG PO TABS: 500.0000 mg | ORAL_TABLET | Freq: Every day | ORAL | Status: DC

## 2013-09-29 NOTE — Telephone Encounter (Signed)
Per Dr Julien Nordmann I called in levaquin and scheduled blood transfusion . Pt notified. Har called for.

## 2013-09-29 NOTE — Telephone Encounter (Signed)
Pt reports he cannot get rid of his "head cold" symptoms. Treated with z pak on 09/16/13.

## 2013-09-30 ENCOUNTER — Other Ambulatory Visit: Payer: Self-pay | Admitting: Physician Assistant

## 2013-10-01 ENCOUNTER — Other Ambulatory Visit: Payer: Medicaid Other

## 2013-10-01 ENCOUNTER — Other Ambulatory Visit: Payer: Self-pay | Admitting: Medical Oncology

## 2013-10-01 ENCOUNTER — Ambulatory Visit (HOSPITAL_BASED_OUTPATIENT_CLINIC_OR_DEPARTMENT_OTHER): Payer: Medicaid Other

## 2013-10-01 VITALS — BP 123/82 | HR 88 | Temp 98.1°F | Resp 18

## 2013-10-01 DIAGNOSIS — C3490 Malignant neoplasm of unspecified part of unspecified bronchus or lung: Secondary | ICD-10-CM

## 2013-10-01 DIAGNOSIS — D649 Anemia, unspecified: Secondary | ICD-10-CM

## 2013-10-01 DIAGNOSIS — D6481 Anemia due to antineoplastic chemotherapy: Secondary | ICD-10-CM

## 2013-10-01 DIAGNOSIS — T451X5A Adverse effect of antineoplastic and immunosuppressive drugs, initial encounter: Secondary | ICD-10-CM

## 2013-10-01 LAB — PREPARE RBC (CROSSMATCH)

## 2013-10-01 MED ORDER — ACETAMINOPHEN 325 MG PO TABS
ORAL_TABLET | ORAL | Status: AC
  Filled 2013-10-01: qty 2

## 2013-10-01 MED ORDER — SODIUM CHLORIDE 0.9 % IJ SOLN
10.0000 mL | INTRAMUSCULAR | Status: DC | PRN
  Administered 2013-10-01: 10 mL via INTRAVENOUS
  Filled 2013-10-01: qty 10

## 2013-10-01 MED ORDER — DIPHENHYDRAMINE HCL 25 MG PO CAPS
ORAL_CAPSULE | ORAL | Status: AC
  Filled 2013-10-01: qty 1

## 2013-10-01 MED ORDER — ACETAMINOPHEN 325 MG PO TABS
650.0000 mg | ORAL_TABLET | Freq: Once | ORAL | Status: AC
  Administered 2013-10-01: 650 mg via ORAL

## 2013-10-01 MED ORDER — SODIUM CHLORIDE 0.9 % IV SOLN
250.0000 mL | Freq: Once | INTRAVENOUS | Status: AC
  Administered 2013-10-01: 250 mL via INTRAVENOUS

## 2013-10-01 MED ORDER — HEPARIN SOD (PORK) LOCK FLUSH 100 UNIT/ML IV SOLN
500.0000 [IU] | Freq: Once | INTRAVENOUS | Status: AC
Start: 2013-10-01 — End: 2013-10-01
  Administered 2013-10-01: 500 [IU] via INTRAVENOUS
  Filled 2013-10-01: qty 5

## 2013-10-01 MED ORDER — DIPHENHYDRAMINE HCL 25 MG PO CAPS
25.0000 mg | ORAL_CAPSULE | Freq: Once | ORAL | Status: AC
  Administered 2013-10-01: 25 mg via ORAL

## 2013-10-01 MED ORDER — OXYCODONE-ACETAMINOPHEN 5-325 MG PO TABS: 1.0000 | ORAL_TABLET | Freq: Four times a day (QID) | ORAL | Status: DC | PRN

## 2013-10-01 NOTE — Patient Instructions (Signed)
Blood Transfusion  A blood transfusion replaces your blood or some of its parts. Blood is replaced when you have lost blood because of surgery, an accident, or for severe blood conditions like anemia. You can donate blood to be used on yourself if you have a planned surgery. If you lose blood during that surgery, your own blood can be given back to you. Any blood given to you is checked to make sure it matches your blood type. Your temperature, blood pressure, and heart rate (vital signs) will be checked often.  GET HELP RIGHT AWAY IF:   You feel sick to your stomach (nauseous) or throw up (vomit).  You have watery poop (diarrhea).  You have shortness of breath or trouble breathing.  You have blood in your pee (urine) or have dark colored pee.  You have chest pain or tightness.  Your eyes or skin turn yellow (jaundice).  You have a temperature by mouth above 102 F (38.9 C), not controlled by medicine.  You start to shake and have chills.  You develop a a red rash (hives) or feel itchy.  You develop lightheadedness or feel confused.  You develop back, joint, or muscle pain.  You do not feel hungry (lost appetite).  You feel tired, restless, or nervous.  You develop belly (abdominal) cramps. Document Released: 11/22/2008 Document Revised: 11/18/2011 Document Reviewed: 11/22/2008 Exit[employer] Patient Information 2014 [employer].  

## 2013-10-01 NOTE — Telephone Encounter (Signed)
Pt requests pain med refill. Prescription given to pt in the infusion room.

## 2013-10-03 LAB — TYPE AND SCREEN
ABO/RH(D): A POS
Antibody Screen: NEGATIVE
UNIT DIVISION: 0
Unit division: 0

## 2013-10-04 ENCOUNTER — Other Ambulatory Visit: Payer: Medicaid Other

## 2013-10-07 ENCOUNTER — Encounter (HOSPITAL_COMMUNITY): Payer: Self-pay | Admitting: Emergency Medicine

## 2013-10-07 ENCOUNTER — Observation Stay (HOSPITAL_COMMUNITY)
Admission: EM | Admit: 2013-10-07 | Discharge: 2013-10-08 | Disposition: A | Discharge status: Home / Self Care | Payer: Medicaid Other | Attending: Family Medicine | Admitting: Family Medicine

## 2013-10-07 ENCOUNTER — Emergency Department (HOSPITAL_COMMUNITY): Payer: Medicaid Other

## 2013-10-07 ENCOUNTER — Observation Stay (HOSPITAL_COMMUNITY): Payer: Medicaid Other

## 2013-10-07 DIAGNOSIS — Z902 Acquired absence of lung [part of]: Secondary | ICD-10-CM | POA: Insufficient documentation

## 2013-10-07 DIAGNOSIS — C787 Secondary malignant neoplasm of liver and intrahepatic bile duct: Secondary | ICD-10-CM | POA: Insufficient documentation

## 2013-10-07 DIAGNOSIS — I10 Essential (primary) hypertension: Secondary | ICD-10-CM | POA: Diagnosis present

## 2013-10-07 DIAGNOSIS — C3490 Malignant neoplasm of unspecified part of unspecified bronchus or lung: Secondary | ICD-10-CM

## 2013-10-07 DIAGNOSIS — D649 Anemia, unspecified: Secondary | ICD-10-CM | POA: Insufficient documentation

## 2013-10-07 DIAGNOSIS — G8929 Other chronic pain: Secondary | ICD-10-CM | POA: Insufficient documentation

## 2013-10-07 DIAGNOSIS — Z85118 Personal history of other malignant neoplasm of bronchus and lung: Secondary | ICD-10-CM

## 2013-10-07 DIAGNOSIS — F1010 Alcohol abuse, uncomplicated: Secondary | ICD-10-CM | POA: Insufficient documentation

## 2013-10-07 DIAGNOSIS — R4182 Altered mental status, unspecified: Secondary | ICD-10-CM

## 2013-10-07 DIAGNOSIS — R519 Headache, unspecified: Secondary | ICD-10-CM | POA: Diagnosis present

## 2013-10-07 DIAGNOSIS — C7949 Secondary malignant neoplasm of other parts of nervous system: Secondary | ICD-10-CM | POA: Diagnosis present

## 2013-10-07 DIAGNOSIS — IMO0002 Reserved for concepts with insufficient information to code with codable children: Secondary | ICD-10-CM | POA: Insufficient documentation

## 2013-10-07 DIAGNOSIS — G9340 Encephalopathy, unspecified: Principal | ICD-10-CM | POA: Diagnosis present

## 2013-10-07 DIAGNOSIS — Z85819 Personal history of malignant neoplasm of unspecified site of lip, oral cavity, and pharynx: Secondary | ICD-10-CM | POA: Diagnosis present

## 2013-10-07 DIAGNOSIS — D759 Disease of blood and blood-forming organs, unspecified: Secondary | ICD-10-CM | POA: Insufficient documentation

## 2013-10-07 DIAGNOSIS — F17200 Nicotine dependence, unspecified, uncomplicated: Secondary | ICD-10-CM

## 2013-10-07 DIAGNOSIS — E43 Unspecified severe protein-calorie malnutrition: Secondary | ICD-10-CM | POA: Diagnosis present

## 2013-10-07 DIAGNOSIS — T451X5A Adverse effect of antineoplastic and immunosuppressive drugs, initial encounter: Secondary | ICD-10-CM | POA: Insufficient documentation

## 2013-10-07 DIAGNOSIS — Z9221 Personal history of antineoplastic chemotherapy: Secondary | ICD-10-CM | POA: Insufficient documentation

## 2013-10-07 DIAGNOSIS — Z8673 Personal history of transient ischemic attack (TIA), and cerebral infarction without residual deficits: Secondary | ICD-10-CM

## 2013-10-07 DIAGNOSIS — C7889 Secondary malignant neoplasm of other digestive organs: Secondary | ICD-10-CM | POA: Insufficient documentation

## 2013-10-07 DIAGNOSIS — Z720 Tobacco use: Secondary | ICD-10-CM | POA: Diagnosis present

## 2013-10-07 DIAGNOSIS — J309 Allergic rhinitis, unspecified: Secondary | ICD-10-CM | POA: Insufficient documentation

## 2013-10-07 DIAGNOSIS — C7931 Secondary malignant neoplasm of brain: Secondary | ICD-10-CM | POA: Insufficient documentation

## 2013-10-07 DIAGNOSIS — E119 Type 2 diabetes mellitus without complications: Secondary | ICD-10-CM | POA: Diagnosis present

## 2013-10-07 DIAGNOSIS — E41 Nutritional marasmus: Secondary | ICD-10-CM | POA: Insufficient documentation

## 2013-10-07 DIAGNOSIS — R51 Headache: Secondary | ICD-10-CM | POA: Diagnosis present

## 2013-10-07 DIAGNOSIS — D7289 Other specified disorders of white blood cells: Secondary | ICD-10-CM | POA: Insufficient documentation

## 2013-10-07 DIAGNOSIS — D696 Thrombocytopenia, unspecified: Secondary | ICD-10-CM | POA: Insufficient documentation

## 2013-10-07 LAB — CBC WITH DIFFERENTIAL/PLATELET
BASOS ABS: 0 10*3/uL (ref 0.0–0.1)
Basophils Relative: 0 % (ref 0–1)
EOS ABS: 0 10*3/uL (ref 0.0–0.7)
EOS PCT: 0 % (ref 0–5)
HCT: 28 % — ABNORMAL LOW (ref 39.0–52.0)
Hemoglobin: 10.3 g/dL — ABNORMAL LOW (ref 13.0–17.0)
Lymphocytes Relative: 5 % — ABNORMAL LOW (ref 12–46)
Lymphs Abs: 0.5 10*3/uL — ABNORMAL LOW (ref 0.7–4.0)
MCH: 31.5 pg (ref 26.0–34.0)
MCHC: 36.8 g/dL — ABNORMAL HIGH (ref 30.0–36.0)
MCV: 85.6 fL (ref 78.0–100.0)
Monocytes Absolute: 0.6 10*3/uL (ref 0.1–1.0)
Monocytes Relative: 6 % (ref 3–12)
Neutro Abs: 10.4 10*3/uL — ABNORMAL HIGH (ref 1.7–7.7)
Neutrophils Relative %: 90 % — ABNORMAL HIGH (ref 43–77)
PLATELETS: 21 10*3/uL — AB (ref 150–400)
RBC: 3.27 MIL/uL — ABNORMAL LOW (ref 4.22–5.81)
RDW: 15.6 % — AB (ref 11.5–15.5)
WBC: 11.6 10*3/uL — ABNORMAL HIGH (ref 4.0–10.5)

## 2013-10-07 LAB — URINALYSIS, ROUTINE W REFLEX MICROSCOPIC
Glucose, UA: 250 mg/dL — AB
HGB URINE DIPSTICK: NEGATIVE
Ketones, ur: 40 mg/dL — AB
LEUKOCYTES UA: NEGATIVE
Nitrite: NEGATIVE
Protein, ur: NEGATIVE mg/dL
SPECIFIC GRAVITY, URINE: 1.018 (ref 1.005–1.030)
UROBILINOGEN UA: 1 mg/dL (ref 0.0–1.0)
pH: 6 (ref 5.0–8.0)

## 2013-10-07 LAB — LIPASE, BLOOD: Lipase: 31 U/L (ref 11–59)

## 2013-10-07 LAB — OSMOLALITY: Osmolality: 284 mOsm/kg (ref 275–300)

## 2013-10-07 LAB — COMPREHENSIVE METABOLIC PANEL
ALT: 23 U/L (ref 0–53)
AST: 29 U/L (ref 0–37)
Albumin: 3.7 g/dL (ref 3.5–5.2)
Alkaline Phosphatase: 159 U/L — ABNORMAL HIGH (ref 39–117)
BUN: 16 mg/dL (ref 6–23)
CALCIUM: 7.9 mg/dL — AB (ref 8.4–10.5)
CO2: 24 mEq/L (ref 19–32)
CREATININE: 0.76 mg/dL (ref 0.50–1.35)
Chloride: 96 mEq/L (ref 96–112)
GFR calc Af Amer: >90 mL/min (ref >90)
GFR calc non Af Amer: >90 mL/min (ref >90)
Glucose, Bld: 258 mg/dL — ABNORMAL HIGH (ref 70–99)
Potassium: 3.8 mEq/L (ref 3.7–5.3)
SODIUM: 144 meq/L (ref 137–147)
Total Bilirubin: 1.2 mg/dL (ref 0.3–1.2)
Total Protein: 6.9 g/dL (ref 6.0–8.3)

## 2013-10-07 LAB — PHOSPHORUS: Phosphorus: 3.8 mg/dL (ref 2.3–4.6)

## 2013-10-07 LAB — MAGNESIUM: Magnesium: 0.6 mg/dL — CL (ref 1.5–2.5)

## 2013-10-07 LAB — AMMONIA: Ammonia: 29 umol/L (ref 11–60)

## 2013-10-07 MED ORDER — IOHEXOL 300 MG/ML  SOLN: 25.0000 mL | INTRAMUSCULAR | Status: AC

## 2013-10-07 MED ORDER — MAGNESIUM SULFATE 40 MG/ML IJ SOLN
2.0000 g | Freq: Once | INTRAMUSCULAR | Status: AC
  Administered 2013-10-07: 2 g via INTRAVENOUS
  Filled 2013-10-07: qty 50

## 2013-10-07 MED ORDER — SODIUM CHLORIDE 0.9 % IJ SOLN
3.0000 mL | Freq: Two times a day (BID) | INTRAMUSCULAR | Status: DC
  Administered 2013-10-08: 3 mL via INTRAVENOUS

## 2013-10-07 MED ORDER — ONDANSETRON HCL 4 MG/2ML IJ SOLN
4.0000 mg | Freq: Once | INTRAMUSCULAR | Status: AC
  Administered 2013-10-07: 4 mg via INTRAVENOUS
  Filled 2013-10-07: qty 2

## 2013-10-07 MED ORDER — ONDANSETRON HCL 4 MG/2ML IJ SOLN: 4.0000 mg | Freq: Once | INTRAMUSCULAR | Status: AC

## 2013-10-07 MED ORDER — SODIUM CHLORIDE 0.9 % IJ SOLN: 3.0000 mL | INTRAMUSCULAR | Status: DC | PRN

## 2013-10-07 MED ORDER — SODIUM CHLORIDE 0.9 % IV BOLUS (SEPSIS)
1000.0000 mL | Freq: Once | INTRAVENOUS | Status: AC
  Administered 2013-10-07: 1000 mL via INTRAVENOUS

## 2013-10-07 MED ORDER — SODIUM CHLORIDE 0.9 % IV SOLN: INTRAVENOUS | Status: AC

## 2013-10-07 MED ORDER — HYDROCORTISONE NA SUCCINATE PF 100 MG IJ SOLR
50.0000 mg | Freq: Four times a day (QID) | INTRAMUSCULAR | Status: DC
  Administered 2013-10-07 – 2013-10-08 (×4): 50 mg via INTRAVENOUS
  Filled 2013-10-07 (×5): qty 1

## 2013-10-07 MED ORDER — IOHEXOL 300 MG/ML  SOLN
100.0000 mL | Freq: Once | INTRAMUSCULAR | Status: AC | PRN
  Administered 2013-10-07: 100 mL via INTRAVENOUS

## 2013-10-07 MED ORDER — SODIUM CHLORIDE 0.9 % IV SOLN: 250.0000 mL | INTRAVENOUS | Status: DC | PRN

## 2013-10-07 MED ORDER — OXYCODONE HCL 5 MG PO TABS
5.0000 mg | ORAL_TABLET | ORAL | Status: DC | PRN
  Administered 2013-10-07 – 2013-10-08 (×4): 5 mg via ORAL
  Filled 2013-10-07 (×4): qty 1

## 2013-10-07 MED ORDER — SODIUM CHLORIDE 0.9 % IJ SOLN: 3.0000 mL | Freq: Two times a day (BID) | INTRAMUSCULAR | Status: DC

## 2013-10-07 MED ORDER — ONDANSETRON HCL 4 MG PO TABS
4.0000 mg | ORAL_TABLET | Freq: Four times a day (QID) | ORAL | Status: DC | PRN
  Administered 2013-10-08: 4 mg via ORAL
  Filled 2013-10-07: qty 1

## 2013-10-07 MED ORDER — PROCHLORPERAZINE EDISYLATE 5 MG/ML IJ SOLN
10.0000 mg | Freq: Four times a day (QID) | INTRAMUSCULAR | Status: DC | PRN
  Administered 2013-10-07: 10 mg via INTRAVENOUS
  Filled 2013-10-07 (×2): qty 2

## 2013-10-07 MED ORDER — ONDANSETRON HCL 4 MG/2ML IJ SOLN: 4.0000 mg | Freq: Four times a day (QID) | INTRAMUSCULAR | Status: DC | PRN

## 2013-10-07 NOTE — ED Notes (Signed)
CRITICAL VALUE ALERT  Critical value received: Platelet 21  Date of notification:  10/07/2013  Time of notification:  1031  Critical value read back:Yes   Nurse who received alert:  Elyn Peers   MD notified (1st page): MD Karle Starch

## 2013-10-07 NOTE — ED Notes (Signed)
Attempted to call report x 1  

## 2013-10-07 NOTE — Progress Notes (Signed)
CRITICAL VALUE ALERT  Critical value received:  Magnesium 0.6  Date of notification:  2/29/15  Time of notification:  1900  Critical value read back:yes  Nurse who received alert:  Leafy Kindle  MD notified (1st page):  Vance Gather  Time of first page:  67  MD notified (2nd page):NA  Time of second page:NA  Responding MD:  Bonner Puna  Time MD responded:  1910 Orders entered

## 2013-10-07 NOTE — ED Notes (Signed)
Patient transported to CT 

## 2013-10-07 NOTE — ED Notes (Signed)
Per EMS: Pt from home, c/o N/V x 2 days and feeling confused. Pt oriented to self. Hx: brain cancer, currently on chemo. GCS 14. VSS.

## 2013-10-07 NOTE — H&P (Signed)
[facility] Admission History and Physical Service Pager: [phone]  Patient name: William Townsend Medical record number: [id number] Date of birth: [birth year] Age: 53 y.o. Gender: male  Primary Care Provider: [name], DO Consultants: Will notify oncologist, Dr. Julien Nordmann in AM Code Status: DNR  Chief Complaint: Weakness, altered mental status  Assessment and Plan: William Townsend is a 53 y.o. year old male presenting with weakness, vomiting, and confusion. 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 He also has a history of multiple CVAs, tobacco abuse, T2DM and HTN not currently being treated.   Stage IV small cell lung CA with brain, liver, and spleen mets. Undergoing palliative chemotherapy (s/p 6 cycles), most recent treatment 1/12-15. CT chest/abd/pelvis showing unchanged liver lesions, further decrease in the size of the cavitary lesion in the left lung, and no other acute abnormalities.  - Will continue to attempt to contact Dr. Julien Nordmann for recommendations ([facility] [phone] "busy" throughout PM on day of admission) - Brain MRI - pending at this time - OxyIR 5mg  q4h prn pain - Compazine 10mg  q6h prn nausea & zofran 4mg  q6h prn nausea - No documentation of palliative care or talks of hospice. Will consider consult to palliative care.   Acute encephalopathy Presentation concerning for new or worsening brain metastases vs. CVA vs. acute intoxication/ingestion vs. alcohol withdrawal vs. occult infection vs. adrenal suppression with decadron. No signs of trauma, no gross lab abnormalities (renal function wnl), ammonia normal, not hypertensive encephalopathy. Urinalysis without signs of infection. No symptoms of PNA.  - Neuro checks q4h, consider CIWA if becomes more  tremulous - Check cortisol and start stress dose steroids (decadron with each infusion for nausea control)  Elevated anion gap of 24 without acidosis (HCO3 24). Delta gap is 13, indicating underlying metabolic alkalosis likely due to chronic respiratory acidosis due to lung disease. Pt denies ingestions, though his mental clearing so far could indicate recovery from acute toxic insult. Albumin and total protein normal.  - Urine with 40 ketones - Check osmolality, magnesium, phosphorus  - Recheck BMP in AM  Myelosuppression secondary to chemotherapy. Received neulasta 1/15 and transfusion of 2u PRBCs 1/23  - Anemia, mild, asymptomatic - Thrombocytopenia (21k): Chronic problem. No signs of bleeding. No indication for transfusion at this time. No pharmacologic DVT prophylaxis and monitor CBCs  Neutrophilic leukocytosis WBC 11.6 (90% PMNs) without signs or symptoms of infection. ?neulasta reaction. - Monitor for signs/symptoms of infection.  History of CVA x3 in 2012. Not on antiplatelet or statin.  - CT head negative, follow up MRI and consider neurological consult.  - s/p brain surgery so not TPA candidate and poor anti-platelet candidate  T2DM: Not on medications or surveillance, glucose elevated on BMP but not to a range capable of explaining his presentation.  - Will not check CBGs. Will follow BMPs. HTN not on medications, normotensive at this time.   FEN/GI: Carb modified, NS at 100cc/hr x12h Prophylaxis: SCDs in light of plt: 21k  Disposition: Admit to FMTS, admitting Dr. Nori Riis  History of Present Illness: William Townsend is a 53 y.o. year old male presenting on 1/29 with dizziness, vomiting, and altered mental status. 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 He also has a history of tobacco abuse, T2DM and HTN not  currently being treated.   He states he woke up this morning and "just felt terrible," with increased nausea, vomiting, dizziness, disorientation, and weakness. He was unable to walk and reported significant confusion. He called EMS for transport to the hospital and required full assistance for transfer though he is normally ambulatory. He reports having bad days after chemo, but has not received chemotherapy recently and this feels worse than those times. He denies recent illness, decreased oral intake, diarrhea, constipation. He endorses intermittent headaches chronically, blurry vision worse in the right eye (but not double vision), and poor coordination. He denies intentional ingestion and suicidal ideation. He has been taking a couple more percocet per day (3 yesterday, 2 on most days)   He takes claritin everyday, and has previously been on prednisone but stopped this 2 weeks ago. He does not take any medications for diabetes and does not check his glucose at home. He was recently treated with levaquin 1/21 for presumed sinusitis. He reports improved, but not completely resolved nasal drainage.   He lives at home with 2 roommates, and drinks about 2 beers per night for many years. He is trying to cut down to improve his health. Denies agitation, feeling guilty or ever needing an eye-opener. Denies ever having shakes.  He continues to smoke approximately 6-7 cigarettes per day. Denies other drugs and ingestions.   From last office visit with Dr. Julien Nordmann (09/20/13): CURRENT THERAPY: Systemic chemotherapy with carboplatin for an AUC of 5 given on day 1 and etoposide 120 mg per meter squared given on days 1, 2 and 3 with Neulasta support given on day 4 status post 5 cycles. Carboplatin was reduced to AUC of 4 and etoposide 100 mg/M2 starting from cycle #4.  DISEASE STAGE: Extensive stage  CHEMOTHERAPY INTENT: Palliative  CURRENT # OF CHEMOTHERAPY CYCLES: 6  CURRENT ANTIEMETICS: Zofran, dexamethasone,  Compazine  CURRENT SMOKING STATUS: Current smoker, strongly advised to quit smoking  ORAL CHEMOTHERAPY AND CONSENT: N./A.  CURRENT BISPHOSPHONATES USE: None  PAIN MANAGEMENT: Percocet  NARCOTICS INDUCED CONSTIPATION: None  LIVING WILL AND CODE STATUS: ?  Last Carboplatin - 09/20/13 Last Etoposide - 1/12, 1/13, 1/14  Last Neulasta - 1/15 Recently Treated with Z-Pack (09/16/13) and Levaquin for a "head cold" (09/29/13) 2Units PRBC Transfusion on 10/01/13 CT Chest/abd/pelvis (with PO contrast) scheduled 1/30   Review Of Systems: Per HPI Otherwise 12 point review of systems was performed and was unremarkable.  Patient Active Problem List   Diagnosis Date Noted  . Small cell lung cancer 06/01/2013  . Brain Metastases from small cell lung cancer 05/16/2013  . Brain mass 04/21/2013  . Lesion of lung 04/21/2013  . Liver lesion 04/21/2013  . Tobacco abuse 04/03/2013  . Headache(784.0) 04/02/2013  . Diabetes mellitus, type 2 01/28/2012  . Essential hypertension, benign 01/28/2012  . History of stroke 01/28/2012  . History of alcohol abuse 01/28/2012  . History of lung cancer 01/28/2012  . History of throat cancer 01/28/2012  . Allergic rhinitis 01/28/2012  . Chronic pain of multiple joints 01/28/2012   Past Medical History: Past Medical History  Diagnosis Date  . Atrial fibrillation   . Diabetes mellitus   . 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 Allergy   . Substance abuse   . Depression   . Stroke     3 previous strokes -  Oct, Nov, and Dec 2012  . Throat cancer     September 2008  . Lung cancer     lobectomy in 2010  . Heart murmur   . Hx of radiation therapy 05/18/13- 06/01/13    whole brain, 30 Gy, 10 fractions   Past Surgical History: Past Surgical History  Procedure Laterality Date  . Lobectomy    . Craniotomy Left 04/27/2013    Procedure: Left Occipital Craniotomy for resection of tumor with microscope;  Surgeon: Floyce Stakes, MD;  Location: [facility];  Service:  Neurosurgery;  Laterality: Left;   Social History: History  Substance Use Topics  . Smoking status: Current Every Day Smoker -- 0.50 packs/day for 30 years    Types: Cigarettes  . Smokeless tobacco: Never Used  . Alcohol Use: 0.0 oz/week    1-2 Cans of beer per week     Comment: couple beers at night   Additional social history: pre HPI Please also refer to relevant sections of EMR.  Family History: Family History  Problem Relation Age of Onset  . Diabetes Mother   . Early death Mother   . Heart disease Father   . Stroke Father   . Asthma Sister   . Depression Sister    Allergies and Medications: Allergies  Allergen Reactions  . Vicodin [Hydrocodone-Acetaminophen] Itching   No current facility-administered medications on file prior to encounter.   Current Outpatient Prescriptions on File Prior to Encounter  Medication Sig Dispense Refill  . levofloxacin (LEVAQUIN) 500 MG tablet Take 1 tablet (500 mg total) by mouth daily.  7 tablet  0  . loratadine (CLARITIN) 10 MG tablet Take 1 tablet (10 mg total) by mouth daily as needed for allergies.  30 tablet  0  . omeprazole (PRILOSEC) 20 MG capsule Take 1 capsule (20 mg total) by mouth 2 (two) times daily before a meal.  60 capsule  1  . oxyCODONE-acetaminophen (PERCOCET/ROXICET) 5-325 MG per tablet Take 1 tablet by mouth every 6 (six) hours as needed.  30 tablet  0  . prochlorperazine (COMPAZINE) 10 MG tablet Take 1 tablet (10 mg total) by mouth every 6 (six) hours as needed.  60 tablet  0    Objective: BP 113/63  Pulse 89  Temp(Src) 98.4 F (36.9 C) (Oral)  Resp 16  SpO2 98% Exam: General: Chronically ill appearing 53yo male in NAD HEENT: NCAT, hyperpigmentation on forehead and bald scalp, conjunctivae normal Cardiovascular: RRR, no murmur noted, no JVD Respiratory: Nonlabored, CTAB Abdomen: +BS, soft, NT, ND, scaphoid Extremities: WWP, no edema in LE's Skin: Scattered seborrheic keratoses on trunk, liver spots on  arms Neuro: Alert and oriented x3, CN II-XII intact, sensation normal x4 extremities, strength 4/5 symmetrically, DTRs 2+, speech normal with limited attention span, visual fields reduced bilaterally, tremor at rest and with motion, grossly abnormal finger-to-nose, rhomberg positive.   Labs and Imaging: CBC BMET   Recent Labs Lab 10/07/13 0945  WBC 11.6*  HGB 10.3*  HCT 28.0*  PLT 21*    Recent Labs Lab 10/07/13 0945  NA 144  K 3.8  CL 96  CO2 24  BUN 16  CREATININE 0.76  GLUCOSE 258*  CALCIUM 7.9*     Color, Urine AMBER (A)  APPearance CLEAR  Specific Gravity, Urine 1.018  pH 6.0  Glucose 250 (A)  Bilirubin Urine SMALL (A)  Ketones, ur 40 (A)  Protein NEGATIVE  Urobilinogen, UA 1.0  Nitrite NEGATIVE  Leukocytes, UA NEGATIVE  Hgb urine  dipstick NEGATIVE    CT ABDOMEN AND PELVIS WITH CONTRAST  FINDINGS:  Metastatic lesions in the right lobe of the liver are again noted.  The large lesion in the mid right lobe measures 4.2 x 4.0 cm,  essentially unchanged. The lesion adjacent to the gallbladder  measures 3.6 x 2.9 cm, slightly smaller. The lesion at the inferior  aspect of the right lobe measures 3.0 x 2.5 cm, essentially  unchanged.  No new lesions. The spleen, pancreas, adrenal glands, and kidneys  are normal. Bowel is normal. No acute osseous abnormalities.  IMPRESSION:  Multiple metastatic lesions of the liver are essentially unchanged.  No new lesions.  CT CHEST WITH CONTRAST  FINDINGS:  Patchy cavitary lesion in the left upper lobe is slightly less  prominent than on the prior study. The area of scarring in the right  upper lobe is stable. Lungs are otherwise clear. There is a stable  12 mm lymph node in the mid right hilum. No new adenopathy. Heart  size is normal.  No osseous abnormality. IMPRESSION:  Further decrease in the size of the cavitary lesion in the left  upper lobe. No other change.  CT HEAD WITHOUT CONTRAST  FINDINGS:  No mass  lesion. No midline shift. No acute hemorrhage or hematoma.  No extra-axial fluid collections. No evidence of acute infarction.  The patient has had any left posterior parietal craniotomy with a  resection of metastatic lesion in the left occipital lobe. There is  secondary encephalomalacia at that site.  There are no new lesions. No acute osseous abnormalities. Ventricles  are normal.  IMPRESSION:  No acute intracranial abnormality. Postsurgical changes in the left  occipital lobe from resection of previous metastasis.  Ryan B. Bonner Puna, MD, PGY-1 10/07/2013 3:59 PM   R3 Addendum:  I have seen the above patient and have discussed the patient's presentation, history, objective data, physical exam and assessment and plan with Dr. Bonner Puna.  I have made all appropriate changes to the above note and agree the note in full including the Exam findings, assessment and plan.   Gerda Diss, DO [facility] Resident - PGY-3 10/07/2013 8:54 PM

## 2013-10-07 NOTE — ED Notes (Signed)
Called MRI to see if pt was to be picked up in next 30 minutes, so as to not delay pt care by transferring pt to floor. MRI states that is will be over 45 minutes until they are ready for pt. Pt transferred to 6N.

## 2013-10-07 NOTE — ED Provider Notes (Signed)
CSN: [id number]     Arrival date & time 10/07/13  0920 History   First MD Initiated Contact with Patient 10/07/13 [phone]     Chief Complaint  Patient presents with  . Altered Mental Status  . Emesis   (Consider location/radiation/quality/duration/timing/severity/associated sxs/prior Treatment) Patient is a 53 y.o. male presenting with altered mental status and vomiting.  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 He has had vomiting for several days, no fever. No abdominal pain, confused this AM and so EMS was called.  Past Medical History  Diagnosis Date  . Atrial fibrillation   . Diabetes mellitus   . 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 Allergy   . Substance abuse   . Depression   . Stroke     3 previous strokes - Oct, Nov, and Dec 2012  . Throat cancer     September 2008  . Lung cancer     lobectomy in 2010  . Heart murmur   . Hx of radiation therapy 05/18/13- 06/01/13    whole brain, 30 Gy, 10 fractions   Past Surgical History  Procedure Laterality Date  . Lobectomy    . Craniotomy Left 04/27/2013    Procedure: Left Occipital Craniotomy for resection of tumor with microscope;  Surgeon: Floyce Stakes, MD;  Location: [facility];  Service: Neurosurgery;  Laterality: Left;   Family History  Problem Relation Age of Onset  . Diabetes Mother   . Early death Mother   . Heart disease Father   . Stroke Father   . Asthma Sister   . Depression Sister    History  Substance Use Topics  . Smoking status: Current Every Day Smoker -- 0.50 packs/day for 30 years    Types: Cigarettes  . Smokeless tobacco: Never Used  . Alcohol Use: 0.0 oz/week    1-2 Cans of beer per week     Comment: couple beers  at night    Review of Systems  Gastrointestinal: Positive for vomiting.   Unable to assess due to mental status.   Allergies  Vicodin  Home Medications   Current Outpatient Rx  Name  Route  Sig  Dispense  Refill  . levofloxacin (LEVAQUIN) 500 MG tablet   Oral   Take 1 tablet (500 mg total) by mouth daily.   7 tablet   0   . loratadine (CLARITIN) 10 MG tablet   Oral   Take 1 tablet (10 mg total) by mouth daily as needed for allergies.   30 tablet   0   . omeprazole (PRILOSEC) 20 MG capsule   Oral   Take 1 capsule (20 mg total) by mouth 2 (two) times daily before a meal.   60 capsule   1   . oxyCODONE-acetaminophen (PERCOCET/ROXICET) 5-325 MG per tablet   Oral   Take 1 tablet by mouth every 6 (six) hours as needed.   30 tablet   0   . prochlorperazine (COMPAZINE) 10 MG tablet   Oral   Take 1 tablet (10 mg total) by mouth every 6 (six) hours as needed.   60 tablet   0    BP 129/88  Pulse 114  Temp(Src) 98.4 F (36.9 C) (  Oral)  Resp 22  SpO2 98% Physical Exam  Nursing note and vitals reviewed. Constitutional: He appears well-developed and well-nourished.  HENT:  Head: Normocephalic and atraumatic.  Eyes: EOM are normal. Pupils are equal, round, and reactive to light.  Neck: Normal range of motion. Neck supple.  Cardiovascular: Normal rate, normal heart sounds and intact distal pulses.   Pulmonary/Chest: Effort normal and breath sounds normal.  Abdominal: Bowel sounds are normal. He exhibits no distension. There is no tenderness. There is no rebound.  Musculoskeletal: Normal range of motion. He exhibits no edema and no tenderness.  Neurological: He is alert. He has normal strength. No cranial nerve deficit or sensory deficit.  Skin: Skin is warm and dry. No rash noted.  Psychiatric: He has a normal mood and affect.    ED Course  Procedures (including critical care time) Labs Review Labs Reviewed  CBC WITH DIFFERENTIAL - Abnormal; Notable for the  following:    WBC 11.6 (*)    RBC 3.27 (*)    Hemoglobin 10.3 (*)    HCT 28.0 (*)    MCHC 36.8 (*)    RDW 15.6 (*)    Platelets 21 (*)    Neutrophils Relative % 90 (*)    Neutro Abs 10.4 (*)    Lymphocytes Relative 5 (*)    Lymphs Abs 0.5 (*)    All other components within normal limits  COMPREHENSIVE METABOLIC PANEL - Abnormal; Notable for the following:    Glucose, Bld 258 (*)    Calcium 7.9 (*)    Alkaline Phosphatase 159 (*)    All other components within normal limits  URINALYSIS, ROUTINE W REFLEX MICROSCOPIC - Abnormal; Notable for the following:    Color, Urine AMBER (*)    Glucose, UA 250 (*)    Bilirubin Urine SMALL (*)    Ketones, ur 40 (*)    All other components within normal limits  BASIC METABOLIC PANEL - Abnormal; Notable for the following:    Sodium 136 (*)    Chloride 94 (*)    Glucose, Bld 281 (*)    Calcium 7.1 (*)    All other components within normal limits  CBC - Abnormal; Notable for the following:    RBC 3.07 (*)    Hemoglobin 9.7 (*)    HCT 26.5 (*)    MCHC 36.6 (*)    RDW 15.7 (*)    Platelets 18 (*)    All other components within normal limits  MAGNESIUM - Abnormal; Notable for the following:    Magnesium 0.6 (*)    All other components within normal limits  MAGNESIUM - Abnormal; Notable for the following:    Magnesium 1.0 (*)    All other components within normal limits  LIPASE, BLOOD  AMMONIA  CORTISOL  OSMOLALITY  PROTIME-INR  PHOSPHORUS  MAGNESIUM   Imaging Review Ct Head Wo Contrast  10/07/2013   CLINICAL DATA:  Vomiting. Confusion. History of metastatic cancer to the brain.  EXAM: CT HEAD WITHOUT CONTRAST  TECHNIQUE: Contiguous axial images were obtained from the base of the skull through the vertex without intravenous contrast.  COMPARISON:  MRI of the brain dated 04/29/2013 and CT scan dated 04/21/2013  FINDINGS: No mass lesion. No midline shift. No acute hemorrhage or hematoma. No extra-axial fluid collections. No evidence of  acute infarction.  The patient has had any left posterior parietal craniotomy with a resection of metastatic lesion in the left occipital lobe. There is secondary encephalomalacia at that  site.  There are no new lesions. No acute osseous abnormalities. Ventricles are normal.  IMPRESSION: No acute intracranial abnormality. Postsurgical changes in the left occipital lobe from resection of previous metastasis.   Electronically Signed   By: Rozetta Nunnery M.D.   On: 10/07/2013 10:36   Ct Chest W Contrast  10/07/2013   CLINICAL DATA:  History of metastatic lung cancer.  EXAM: CT CHEST WITH CONTRAST  TECHNIQUE: Multidetector CT imaging of the chest was performed during intravenous contrast administration.  CONTRAST:  14mL OMNIPAQUE IOHEXOL 300 MG/ML  SOLN  COMPARISON:  CT scan of chest dated 08/31/2013  FINDINGS: Patchy cavitary lesion in the left upper lobe is slightly less prominent than on the prior study. The area of scarring in the right upper lobe is stable. Lungs are otherwise clear. There is a stable 12 mm lymph node in the mid right hilum. No new adenopathy. Heart size is normal.  No osseous abnormality.  IMPRESSION: Further decrease in the size of the cavitary lesion in the left upper lobe. No other change.   Electronically Signed   By: Rozetta Nunnery M.D.   On: 10/07/2013 12:57   Ct Abdomen Pelvis W Contrast  10/07/2013   CLINICAL DATA:  Metastatic lung cancer.  EXAM: CT ABDOMEN AND PELVIS WITH CONTRAST  TECHNIQUE: Multidetector CT imaging of the abdomen and pelvis was performed using the standard protocol following bolus administration of intravenous contrast.  CONTRAST:  148mL OMNIPAQUE IOHEXOL 300 MG/ML  SOLN  COMPARISON:  CT scan dated 08/31/2013  FINDINGS: Metastatic lesions in the right lobe of the liver are again noted. The large lesion in the mid right lobe measures 4.2 x 4.0 cm, essentially unchanged. The lesion adjacent to the gallbladder measures 3.6 x 2.9 cm, slightly smaller. The lesion at the  inferior aspect of the right lobe measures 3.0 x 2.5 cm, essentially unchanged.  No new lesions. The spleen, pancreas, adrenal glands, and kidneys are normal. Bowel is normal. No acute osseous abnormalities.  IMPRESSION: Multiple metastatic lesions of the liver are essentially unchanged. No new lesions.   Electronically Signed   By: Rozetta Nunnery M.D.   On: 10/07/2013 13:00    EKG Interpretation    Date/Time:  Thursday October 07 2013 09:28:32 EST Ventricular Rate:  114 PR Interval:  132 QRS Duration: 85 QT Interval:  318 QTC Calculation: 438 R Axis:   0 Text Interpretation:  Sinus tachycardia S1,S2,S3 pattern Since last tracing Rate faster Confirmed by Jennefer Kopp  MD, Arma Reining ([id number]) on 10/07/2013 10:10:57 AM            MDM   1. Altered mental state   2. Diabetes mellitus, type 2   3. Essential hypertension, benign   4. History of lung cancer   5. History of stroke   6. Small cell lung cancer   7. Tobacco abuse   8. Secondary malignant neoplasm of brain and spinal cord     Labs and CT head unremarkable. Pt is still getting chemo, this is his week off, typically has vomiting and weakness but not mental status changes. He is scheduled for CT surveillance of his metastatic lesions, will go ahead and do them now. Admit for further eval.     Willadeen Colantuono B. Karle Starch, MD 10/08/13 [phone]

## 2013-10-07 NOTE — ED Notes (Signed)
[place], [place]: [phone]

## 2013-10-07 NOTE — ED Notes (Signed)
MD Karle Starch at bedside.

## 2013-10-08 ENCOUNTER — Ambulatory Visit (HOSPITAL_COMMUNITY): Payer: Medicaid Other

## 2013-10-08 ENCOUNTER — Telehealth: Payer: Self-pay | Admitting: Internal Medicine

## 2013-10-08 ENCOUNTER — Observation Stay (HOSPITAL_COMMUNITY): Payer: Medicaid Other

## 2013-10-08 ENCOUNTER — Telehealth: Payer: Self-pay | Admitting: Dietician

## 2013-10-08 DIAGNOSIS — G9340 Encephalopathy, unspecified: Principal | ICD-10-CM

## 2013-10-08 DIAGNOSIS — C7931 Secondary malignant neoplasm of brain: Secondary | ICD-10-CM

## 2013-10-08 DIAGNOSIS — E43 Unspecified severe protein-calorie malnutrition: Secondary | ICD-10-CM | POA: Diagnosis present

## 2013-10-08 DIAGNOSIS — C7949 Secondary malignant neoplasm of other parts of nervous system: Secondary | ICD-10-CM

## 2013-10-08 LAB — BASIC METABOLIC PANEL
BUN: 11 mg/dL (ref 6–23)
CALCIUM: 7.1 mg/dL — AB (ref 8.4–10.5)
CO2: 26 mEq/L (ref 19–32)
CREATININE: 0.64 mg/dL (ref 0.50–1.35)
Chloride: 94 mEq/L — ABNORMAL LOW (ref 96–112)
GFR calc non Af Amer: >90 mL/min (ref >90)
Glucose, Bld: 281 mg/dL — ABNORMAL HIGH (ref 70–99)
Potassium: 3.8 mEq/L (ref 3.7–5.3)
Sodium: 136 mEq/L — ABNORMAL LOW (ref 137–147)

## 2013-10-08 LAB — PROTIME-INR
INR: 1.15 (ref 0.00–1.49)
Prothrombin Time: 14.5 seconds (ref 11.6–15.2)

## 2013-10-08 LAB — MAGNESIUM
MAGNESIUM: 1.8 mg/dL (ref 1.5–2.5)
Magnesium: 1 mg/dL — ABNORMAL LOW (ref 1.5–2.5)

## 2013-10-08 LAB — CBC
HEMATOCRIT: 26.5 % — AB (ref 39.0–52.0)
Hemoglobin: 9.7 g/dL — ABNORMAL LOW (ref 13.0–17.0)
MCH: 31.6 pg (ref 26.0–34.0)
MCHC: 36.6 g/dL — AB (ref 30.0–36.0)
MCV: 86.3 fL (ref 78.0–100.0)
PLATELETS: 18 10*3/uL — AB (ref 150–400)
RBC: 3.07 MIL/uL — ABNORMAL LOW (ref 4.22–5.81)
RDW: 15.7 % — ABNORMAL HIGH (ref 11.5–15.5)
WBC: 5.5 10*3/uL (ref 4.0–10.5)

## 2013-10-08 LAB — CORTISOL: CORTISOL PLASMA: 16.9 ug/dL

## 2013-10-08 MED ORDER — MAGNESIUM SULFATE 40 MG/ML IJ SOLN
2.0000 g | Freq: Once | INTRAMUSCULAR | Status: AC
  Administered 2013-10-08: 2 g via INTRAVENOUS
  Filled 2013-10-08: qty 50

## 2013-10-08 MED ORDER — ENSURE COMPLETE PO LIQD: 237.0000 mL | Freq: Two times a day (BID) | ORAL | Status: DC

## 2013-10-08 MED ORDER — GADOBENATE DIMEGLUMINE 529 MG/ML IV SOLN
15.0000 mL | Freq: Once | INTRAVENOUS | Status: AC
  Administered 2013-10-08: 15 mL via INTRAVENOUS

## 2013-10-08 NOTE — Discharge Summary (Signed)
[facility] Discharge Summary  Patient name: William Townsend Medical record number: [id number] Date of birth: 04-04-1961 Age: 53 y.o. Gender: male Date of Admission: 10/07/2013  Date of Discharge: 10/08/2013 Admitting Physician: Dickie La, MD  Primary Care Provider: [name], DO Consultants: None  Indication for Hospitalization: Altered mental status  Discharge Diagnoses/Problem List:  Patient Active Problem List   Diagnosis Date Noted  . Small cell lung cancer 06/01/2013  . Brain Metastases from small cell lung cancer 05/16/2013  . Brain mass 04/21/2013  . Lesion of lung 04/21/2013  . Liver lesion 04/21/2013  . Tobacco abuse 04/03/2013  . Headache(784.0) 04/02/2013  . Diabetes mellitus, type 2 01/28/2012  . Essential hypertension, benign 01/28/2012  . History of stroke 01/28/2012  . History of alcohol abuse 01/28/2012  . History of lung cancer 01/28/2012  . History of throat cancer 01/28/2012  . Allergic rhinitis 01/28/2012  . Chronic pain of multiple joints 01/28/2012   Disposition: Discharged to home  Discharge Condition: Stable  Discharge Exam:  General: Chronically ill appearing 54yo man in NAD  Cardiovascular: RRR, no M/R/G, no JVD  Respiratory: Non-labored, CTAB  Abdomen: +BS, soft, NT, ND  Extremities: WWP, no edema  Neuro: A&Ox3, continued reduced visual fields and symmetric weakness.  Brief Hospital Course:  William Townsend is a 53 y.o. year old male presenting 1/29 with weakness, vomiting, and confusion. 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 He also has a history of multiple CVAs, tobacco abuse, T2DM and HTN not currently being treated.   William Townsend reports experiencing profound weakness, confusion and disorientation upon waking  on 1/29 and subsequently called EMS. On arrival to the hospital vital signs were stable, a CT head showed no acute abnormalities, and blood work did not reveal an obvious etiology of encephalopathy. Magnesium was found to be low and was repleted with 2g magnesium IV x2. ECG was within normal limits. Ammonia was normal, glucose was mildly elevated, and there were no symptoms of infection.   There was a neutrophilic leukocytosis, a mild anemia, and severe thrombocytopenia which were seen as the result of chronic myelosuppression with recent neulasta and PRBC transfusions. There were no signs of bleeding.  He had finished a cycle of palliative chemotherapy a few days prior and had been vomiting, which is not unusual for him. An MRI of the brain was obtained for concern of new or worsening mass lesion, but this showed no signs of an etiology.  Stress-dose steroids were given empirically for concern of cerebral edema/mass effect and for possible adrenal suppression due to decadron use that was discontinued recently. Cortisol was 16.9, which was thought to be an appropriate stress response and steroids were stopped.   There were no medications listed for hypertension or diabetes and noe were started here. Blood pressures were normotensive bordering on hypotension and glucose readings from metabolic panels showed hyperglycemia in the 200s.    Issues for Follow Up:  - Consider palliative care consultation.  - Consider appetite stimulant and/or nutritional supplements, as patient is severely malnourished.  - Follow CBC for severe thrombocytopenia and anemia.   Significant Procedures: None  Significant Labs and Imaging:   Recent Labs Lab 10/07/13 0945 10/08/13 0425  WBC 11.6* 5.5  HGB 10.3* 9.7*  HCT 28.0* 26.5*  PLT  21* 18*    Recent Labs Lab 10/07/13 0945 10/07/13 1535 10/08/13 0425  NA 144  --  136*  K 3.8  --  3.8  CL 96  --  94*  CO2 24  --  26  GLUCOSE 258*  --  281*  BUN 16  --  11   CREATININE 0.76  --  0.64  CALCIUM 7.9*  --  7.1*  MG  --  0.6* 1.0*  PHOS  --  3.8  --   ALKPHOS 159*  --   --   AST 29  --   --   ALT 23  --   --   ALBUMIN 3.7  --   --    Color, Urine  AMBER (A)   APPearance  CLEAR   Specific Gravity, Urine  1.018   pH  6.0   Glucose  250 (A)   Bilirubin Urine  SMALL (A)   Ketones, ur  40 (A)   Protein  NEGATIVE   Urobilinogen, UA  1.0   Nitrite  NEGATIVE   Leukocytes, UA  NEGATIVE   Hgb urine dipstick  NEGATIVE    CT ABDOMEN AND PELVIS WITH CONTRAST  FINDINGS:  Metastatic lesions in the right lobe of the liver are again noted.  The large lesion in the mid right lobe measures 4.2 x 4.0 cm,  essentially unchanged. The lesion adjacent to the gallbladder  measures 3.6 x 2.9 cm, slightly smaller. The lesion at the inferior  aspect of the right lobe measures 3.0 x 2.5 cm, essentially  unchanged.  No new lesions. The spleen, pancreas, adrenal glands, and kidneys  are normal. Bowel is normal. No acute osseous abnormalities.  IMPRESSION:  Multiple metastatic lesions of the liver are essentially unchanged.  No new lesions.   CT CHEST WITH CONTRAST  FINDINGS:  Patchy cavitary lesion in the left upper lobe is slightly less  prominent than on the prior study. The area of scarring in the right  upper lobe is stable. Lungs are otherwise clear. There is a stable  12 mm lymph node in the mid right hilum. No new adenopathy. Heart  size is normal.  No osseous abnormality.  IMPRESSION:  Further decrease in the size of the cavitary lesion in the left  upper lobe. No other change.   CT HEAD WITHOUT CONTRAST  FINDINGS:  No mass lesion. No midline shift. No acute hemorrhage or hematoma.  No extra-axial fluid collections. No evidence of acute infarction.  The patient has had any left posterior parietal craniotomy with a  resection of metastatic lesion in the left occipital lobe. There is  secondary encephalomalacia at that site.  There are no new  lesions. No acute osseous abnormalities. Ventricles  are normal.  IMPRESSION:  No acute intracranial abnormality. Postsurgical changes in the left  occipital lobe from resection of previous metastasis.  Results/Tests Pending at Time of Discharge: None  Discharge Medications:    Medication List         levofloxacin 500 MG tablet  Commonly known as:  LEVAQUIN  Take 1 tablet (500 mg total) by mouth daily.     loratadine 10 MG tablet  Commonly known as:  CLARITIN  Take 1 tablet (10 mg total) by mouth daily as needed for allergies.     omeprazole 20 MG capsule  Commonly known as:  PRILOSEC  Take 20 mg by mouth daily.     oxyCODONE-acetaminophen 5-325 MG per tablet  Commonly known as:  PERCOCET/ROXICET  Take 1 tablet by mouth every 6 (six) hours as needed for severe pain.       Discharge Instructions: Please refer to Patient Instructions section of EMR for full details.  Patient was counseled important signs and symptoms that should prompt return to medical care, changes in medications, dietary instructions, activity restrictions, and follow up appointments.   Follow-Up Appointments:     Follow-up Information   Follow up with Vance Gather, MD. (2:30pm )    Specialty:  [facility]   Contact information:   [address] [place] [address] [phone]       Follow up with Eilleen Kempf., MD On 10/11/2013. (10:15am lab appt, 10:45am appt with DDr. Julien Nordmann)    Specialty:  Oncology   Contact information:   [place] [address] [phone]       Vance Gather, MD 10/08/2013, 11:57 AM PGY-1, [place]

## 2013-10-08 NOTE — Discharge Instructions (Signed)
You were hospitalized with altered mental status and weakness and have improved. Your brain MRI showed no new masses and you are stable for discharge. You should follow up with Dr. Julien Nordmann on Monday and with [facility] on Friday. No changes are being made in your medication regimen.   If you experience other acute changes in mental status, problems speaking, chest pain or shortness of breath please call the clinic or present to an emergency room.

## 2013-10-08 NOTE — Progress Notes (Signed)
CRITICAL VALUE ALERT  Critical value received: Platelets  Date of notification: 10/08/13  Time of notification: 0602  Critical value read back:yes  Nurse who received alert: Deeann Dowse RN  MD notified (1st page): Dr. Berkley Harvey  Time of first page:  0610  MD notified (2nd page):  Time of second page:  Responding MD: Dr. Berkley Harvey  Time MD responded:  [phone]

## 2013-10-08 NOTE — H&P (Signed)
FMTS Attending Admission Note: Amir Fick MD [phone] pager office [phone] I  have seen and examined this patient, reviewed their chart. I have discussed this patient with the resident. I agree with the resident's findings, assessment and care plan. 

## 2013-10-08 NOTE — Telephone Encounter (Signed)
returned pt call and advised that i neded to know what he is trying to r/s...lvm for pt to call back with d.t that he is trying to r/s

## 2013-10-08 NOTE — Discharge Summary (Signed)
Family Medicine Teaching Service  Discharge Note : Attending Meriam Chojnowski MD Pager [phone] Office [phone] I have seen and examined this patient, reviewed their chart and discussed discharge planning wit the resident at the time of discharge. I agree with the discharge plan as above.  

## 2013-10-08 NOTE — Progress Notes (Signed)
INITIAL NUTRITION ASSESSMENT  DOCUMENTATION CODES Per approved criteria  -Severe malnutrition in the context of chronic illness  Pt meets criteria for SEVERE MALNUTRITION in the context of CHRONIC ILLNESS as evidenced by 10% weight loss in less than 5 months and signs of severe muscle wasting evidenced in physical exam.  INTERVENTION: Provide Ensure Complete BID Encourage PO intake  NUTRITION DIAGNOSIS: Inadequate oral intake related to poor appetite, nausea, taste changes as evidenced by 10% weight loss in less than 5 months.   Goal: Pt to meet >/= 90% of their estimated nutrition needs   Monitor:  PO intake Weight Labs  Reason for Assessment: Malnutrition Screening Tool, score of 5  53 y.o. male  Admitting Dx: <principal problem not specified>  ASSESSMENT: 53 y.o. year old male presenting with weakness, vomiting, and confusion. 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 He also has a history of multiple CVAs, tobacco abuse, T2DM and HTN not currently being treated.   Pt reports that his usual body weight is around 180 lbs. He reports losing weight while undergoing 6 weeks of chemo. He relates weight loss to poor appetite, nausea, and taste changes. He was drinking Ensure Complete twice daily during that time but, he states that in the past 7 to 10 days he had only eaten 3 times and had stopped drinking Ensure due to feeling unwell. He reports feeling better today with a good appetite and states he ate most of his breakfast. Pt states he is done with chemo for now and hopes he continues to have a good appetite.  Encouraged pt to continue eating well and to continue use of Ensure supplements to promote weight gain. He states he has an entire case of Ensure left at home.   Nutrition Focused  Physical Exam:  Subcutaneous Fat:  Orbital Region: mild wasting Upper Arm Region: moderate wasting Thoracic and Lumbar Region: moderate to severe wasting  Muscle:  Temple Region: mild wasting Clavicle Bone Region: mild wasting Clavicle and Acromion Bone Region: mild wasting Scapular Bone Region: NA Dorsal Hand: mild wasting Patellar Region: severe wasting Anterior Thigh Region: moderate wasting Posterior Calf Region: severe wasting  Edema: none   Height: Ht Readings from Last 1 Encounters:  10/07/13 6' (1.829 m)    Weight: Wt Readings from Last 1 Encounters:  10/07/13 153 lb 3.5 oz (69.5 kg)    Ideal Body Weight: 178 lbs  % Ideal Body Weight: 86%  Wt Readings from Last 10 Encounters:  10/07/13 153 lb 3.5 oz (69.5 kg)  09/20/13 150 lb 6.4 oz (68.221 kg)  09/01/13 154 lb 14.4 oz (70.262 kg)  08/12/13 152 lb 1.6 oz (68.992 kg)  08/09/13 156 lb 8 oz (70.988 kg)  07/19/13 162 lb 11.2 oz (73.8 kg)  06/28/13 169 lb (76.658 kg)  06/14/13 160 lb 3.2 oz (72.666 kg)  06/14/13 161 lb (73.029 kg)  06/01/13 171 lb (77.565 kg)    Usual Body Weight: 180 lbs  % Usual Body Weight: 85%  BMI:  Body mass index is 20.78 kg/(m^2).  Estimated Nutritional Needs: Kcal: 2100-2400 Protein: 85-95 grams Fluid: 2/1-2.4 L/day  Skin: WDL  Diet Order: Carb Control  EDUCATION NEEDS: -No education needs identified at this time   Intake/Output Summary (Last 24 hours) at 10/08/13 1225 Last data filed at 10/08/13 0502  Gross  per 24 hour  Intake    560 ml  Output      2 ml  Net    558 ml    Last BM: 1/28   Labs:   Recent Labs Lab 10/07/13 0945 10/07/13 1535 10/08/13 0425  NA 144  --  136*  K 3.8  --  3.8  CL 96  --  94*  CO2 24  --  26  BUN 16  --  11  CREATININE 0.76  --  0.64  CALCIUM 7.9*  --  7.1*  MG  --  0.6* 1.0*  PHOS  --  3.8  --   GLUCOSE 258*  --  281*    CBG (last 3)  No results found for this basename: GLUCAP,  in the last 72 hours  Scheduled  Meds: . sodium chloride   Intravenous STAT  . hydrocortisone sod succinate (SOLU-CORTEF) inj  50 mg Intravenous Q6H  . sodium chloride  3 mL Intravenous Q12H    Continuous Infusions:   Past Medical History  Diagnosis Date  . Atrial fibrillation   . Diabetes mellitus   . 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 Allergy   . Substance abuse   . Depression   . Stroke     3 previous strokes - Oct, Nov, and Dec 2012  . Throat cancer     September 2008  . Lung cancer     lobectomy in 2010  . Heart murmur   . Hx of radiation therapy 05/18/13- 06/01/13    whole brain, 30 Gy, 10 fractions    Past Surgical History  Procedure Laterality Date  . Lobectomy    . Craniotomy Left 04/27/2013    Procedure: Left Occipital Craniotomy for resection of tumor with microscope;  Surgeon: Floyce Stakes, MD;  Location: [facility];  Service: Neurosurgery;  Laterality: Left;    Pryor Ochoa RD, LDN Inpatient Clinical Dietitian Pager: [phone] After Hours Pager: [phone]

## 2013-10-08 NOTE — Progress Notes (Signed)
FMTS Attending Daily Note: William Deskins MD [phone] pager office [phone] I  have seen and examined this patient, reviewed their chart. I have discussed this patient with the resident. I agree with the resident's findings, assessment and care plan. 

## 2013-10-08 NOTE — Progress Notes (Signed)
Family Medicine Teaching Service Daily Progress Note Intern Pager: [phone]  Patient name: William Townsend Medical record number: [id number] Date of birth: 1960/09/28 Age: 53 y.o. Gender: male  Primary Care Provider: [name], DO Consultants: Dr. Julien Nordmann Code Status: DNR  Pt Overview and Major Events to Date:  1/29: Admitted for altered mental status/weakness  Assessment and Plan: William Townsend is a 52 y.o. year old male presenting with weakness, vomiting, and confusion. 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 He also has a history of multiple CVAs, tobacco abuse, T2DM and HTN not currently being treated.   Stage IV small cell lung CA with brain, liver, and spleen mets. Undergoing palliative chemotherapy (s/p 6 cycles), most recent treatment 1/12-15. CT chest/abd/pelvis showing unchanged liver lesions, further decrease in the size of the cavitary lesion in the left lung, and no other acute abnormalities.  - Will continue to attempt to contact Dr. Julien Nordmann for recommendations.  - Brain MRI performed, not read  - OxyIR 5mg  q4h prn pain  - Compazine 10mg  q6h prn nausea & zofran 4mg  q6h prn nausea  - No documentation of palliative care or talks of hospice. Will consider consult to palliative care.   Acute encephalopathy Presentation concerning for new or worsening brain metastases vs. CVA vs. acute intoxication/ingestion vs. alcohol withdrawal vs. occult infection vs. adrenal suppression with decadron. No signs of trauma, no gross lab abnormalities (renal function wnl), ammonia normal, no hypertensive encephalopathy. Urinalysis without signs of infection. No symptoms of PNA.  - Neuro checks q4h, consider CIWA if becomes more tremulous  - Cortisol 16.9; giving stress dose steroids (decadron with  each infusion for nausea control)   Elevated anion gap Improving 24 > 16 without acidosis. No osmolal gap (284 measured, 308 calculated) - Urine with 40 ketones  - Recheck BMP in AM   Hypomagnesemia: 0.6 > 1.0 s/p 2g repletion. Repeat 2g repletion and recheck this PM. ECG wnl.   Myelosuppression secondary to chemotherapy. Received neulasta 1/15 and transfusion of 2u PRBCs 1/23  - Anemia, mild, asymptomatic  - Thrombocytopenia (21 > 18): Chronic problem. No signs of bleeding. No indication for transfusion at this time. No pharmacologic DVT prophylaxis and monitor CBCs   Neutrophilic leukocytosis Resolved. WBC 11.6 (90% PMNs) > 5.5. without signs or symptoms of infection. ?neulasta reaction.  - Monitor for signs/symptoms of infection.   History of CVA x3 in 2012. Not on antiplatelet or statin.  - CT head negative: follow up MRI and consider neurological consult.  - s/p brain surgery so not TPA candidate and poor anti-platelet candidate   T2DM: Not on medications or surveillance, glucose elevated on BMP but not to a range capable of explaining his presentation.  - Hyperglycemia will worsen with steroids - CBG AC/HS with SSI.  HTN not on medications, normotensive at this time.   FEN/GI: Carb modified, NS at 100cc/hr x12h  Prophylaxis: SCDs in light of plt: 21k   Disposition: Continued observation.   Subjective: He feels he is improving, biggest concern is a headache that is improving and improved with norco. He is also eating better and thinks that decreased intake may have had something to do with his weakness.   Objective: Temp:  [98.3 F (36.8 C)-98.6 F (37 C)] 98.4 F (36.9 C) (01/30 0448) Pulse Rate:  [80-116] 81 (  01/30 0448) Resp:  [13-27] 16 (01/30 0448) BP: (96-141)/(50-88) 100/50 mmHg (01/30 0456) SpO2:  [97 %-100 %] 99 % (01/30 0448) Weight:  [153 lb 3.5 oz (69.5 kg)] 153 lb 3.5 oz (69.5 kg) (01/29 1302) Physical Exam: General: Chronically ill appearing 53yo man in  NAD Cardiovascular: RRR, no M/R/G, no JVD Respiratory: Non-labored, CTAB Abdomen: +BS, soft, NT, ND Extremities: WWP, no edema Neuro: A&Ox3, continued reduced visual fields and symmetric weakness.  Laboratory:  Recent Labs Lab 10/07/13 0945 10/08/13 0425  WBC 11.6* 5.5  HGB 10.3* 9.7*  HCT 28.0* 26.5*  PLT 21* 18*    Recent Labs Lab 10/07/13 0945 10/08/13 0425  NA 144 136*  K 3.8 3.8  CL 96 94*  CO2 24 26  BUN 16 11  CREATININE 0.76 0.64  CALCIUM 7.9* 7.1*  PROT 6.9  --   BILITOT 1.2  --   ALKPHOS 159*  --   ALT 23  --   AST 29  --   GLUCOSE 258* 281*   Color, Urine  AMBER (A)   APPearance  CLEAR   Specific Gravity, Urine  1.018   pH  6.0   Glucose  250 (A)   Bilirubin Urine  SMALL (A)   Ketones, ur  40 (A)   Protein  NEGATIVE   Urobilinogen, UA  1.0   Nitrite  NEGATIVE   Leukocytes, UA  NEGATIVE   Hgb urine dipstick  NEGATIVE    CT ABDOMEN AND PELVIS WITH CONTRAST  FINDINGS:  Metastatic lesions in the right lobe of the liver are again noted.  The large lesion in the mid right lobe measures 4.2 x 4.0 cm,  essentially unchanged. The lesion adjacent to the gallbladder  measures 3.6 x 2.9 cm, slightly smaller. The lesion at the inferior  aspect of the right lobe measures 3.0 x 2.5 cm, essentially  unchanged.  No new lesions. The spleen, pancreas, adrenal glands, and kidneys  are normal. Bowel is normal. No acute osseous abnormalities.  IMPRESSION:  Multiple metastatic lesions of the liver are essentially unchanged.  No new lesions.   CT CHEST WITH CONTRAST  FINDINGS:  Patchy cavitary lesion in the left upper lobe is slightly less  prominent than on the prior study. The area of scarring in the right  upper lobe is stable. Lungs are otherwise clear. There is a stable  12 mm lymph node in the mid right hilum. No new adenopathy. Heart  size is normal.  No osseous abnormality.  IMPRESSION:  Further decrease in the size of the cavitary lesion in the  left  upper lobe. No other change.   CT HEAD WITHOUT CONTRAST  FINDINGS:  No mass lesion. No midline shift. No acute hemorrhage or hematoma.  No extra-axial fluid collections. No evidence of acute infarction.  The patient has had any left posterior parietal craniotomy with a  resection of metastatic lesion in the left occipital lobe. There is  secondary encephalomalacia at that site.  There are no new lesions. No acute osseous abnormalities. Ventricles  are normal.  IMPRESSION:  No acute intracranial abnormality. Postsurgical changes in the left  occipital lobe from resection of previous metastasis.   Vance Gather, MD 10/08/2013, 8:31 AM PGY-1, [place] Intern pager: [phone], text pages welcome

## 2013-10-08 NOTE — Telephone Encounter (Signed)
Brief Outpatient Oncology Nutrition Note  Patient has been identified to be at risk on malnutrition screen.  Wt Readings from Last 10 Encounters:  10/07/13 153 lb 3.5 oz (69.5 kg)  09/20/13 150 lb 6.4 oz (68.221 kg)  09/01/13 154 lb 14.4 oz (70.262 kg)  08/12/13 152 lb 1.6 oz (68.992 kg)  08/09/13 156 lb 8 oz (70.988 kg)  07/19/13 162 lb 11.2 oz (73.8 kg)  06/28/13 169 lb (76.658 kg)  06/14/13 160 lb 3.2 oz (72.666 kg)  06/14/13 161 lb (73.029 kg)  06/01/13 171 lb (77.565 kg)    Dx:  Stage 4 small cell lung cancer  Called patient due to weight loss.  Patient currently d/c'ing from hospital and was seen by the inpatient RD today.  Uses Ensure at home.  Problems with poor appetite, nausea, and taste changes.  Patient was diagnosed with sever malnutrition.  Left message for him to call the [facility] with any nutrition related questions or for an appointment.  Contact information provided.    Antonieta Iba, RD, LDN

## 2013-10-11 ENCOUNTER — Other Ambulatory Visit: Payer: Medicaid Other

## 2013-10-11 ENCOUNTER — Ambulatory Visit: Payer: Medicaid Other | Admitting: Internal Medicine

## 2013-10-11 ENCOUNTER — Other Ambulatory Visit: Payer: Self-pay | Admitting: *Deleted

## 2013-10-12 ENCOUNTER — Telehealth: Payer: Self-pay | Admitting: Internal Medicine

## 2013-10-12 NOTE — Telephone Encounter (Signed)
lvm for pt regarding to r/s appt for Feb...mailed pt appt sched and letter

## 2013-10-13 ENCOUNTER — Telehealth: Payer: Self-pay | Admitting: Radiation Oncology

## 2013-10-13 ENCOUNTER — Ambulatory Visit (HOSPITAL_COMMUNITY)
Admission: RE | Admit: 2013-10-13 | Discharge: 2013-10-13 | Disposition: A | Discharge status: Home / Self Care | Payer: Medicaid Other | Source: Ambulatory Visit | Attending: Radiation Oncology | Admitting: Radiation Oncology

## 2013-10-13 ENCOUNTER — Ambulatory Visit: Payer: Medicaid Other | Attending: Radiation Oncology

## 2013-10-13 DIAGNOSIS — C7931 Secondary malignant neoplasm of brain: Secondary | ICD-10-CM

## 2013-10-13 DIAGNOSIS — C7949 Secondary malignant neoplasm of other parts of nervous system: Principal | ICD-10-CM

## 2013-10-13 NOTE — Telephone Encounter (Signed)
Received call from [facility]. He explains this patient was scheduled for MRI on 08/12/2013 but, was hospitalized since and is curious if scan that is only a few days old needs to be repeated. Pulled follow up noted. MRI was originally ordered on 08/12/13 as a post xrt baseline MRI with follow up in 2 months therefore, repeat MRI isn't needed. Marjory Lies reports he will attempt to reach patient to cancel today's MRI.

## 2013-10-14 ENCOUNTER — Ambulatory Visit
Admission: RE | Admit: 2013-10-14 | Discharge: 2013-10-14 | Disposition: A | Discharge status: Home / Self Care | Payer: Medicaid Other | Source: Ambulatory Visit | Attending: Radiation Oncology | Admitting: Radiation Oncology

## 2013-10-14 ENCOUNTER — Telehealth: Payer: Self-pay | Admitting: *Deleted

## 2013-10-14 ENCOUNTER — Encounter: Payer: Self-pay | Admitting: Radiation Oncology

## 2013-10-14 ENCOUNTER — Telehealth: Payer: Self-pay | Admitting: Internal Medicine

## 2013-10-14 VITALS — BP 161/93 | HR 94 | Temp 98.0°F | Resp 16 | Wt 149.2 lb

## 2013-10-14 DIAGNOSIS — C3490 Malignant neoplasm of unspecified part of unspecified bronchus or lung: Secondary | ICD-10-CM

## 2013-10-14 DIAGNOSIS — C7931 Secondary malignant neoplasm of brain: Secondary | ICD-10-CM

## 2013-10-14 DIAGNOSIS — C7949 Secondary malignant neoplasm of other parts of nervous system: Principal | ICD-10-CM

## 2013-10-14 NOTE — Telephone Encounter (Signed)
No need for new MRI

## 2013-10-14 NOTE — Telephone Encounter (Signed)
Gave pt appt calendar today for February 2015

## 2013-10-14 NOTE — Progress Notes (Signed)
Flat affect noted. Denies pain at this time. Denies headache, dizziness, nausea, vomiting, or ringing in the ear. Reports diminished vision. Reports difficulty sleeping last night. Explains that for two weeks following chemotherapy his body aches to the point all he can do is lay around.  Hyperpigmentation without desquamation of forehead noted. Steady gait noted. Does not take decadron. Reports taking mucinex for two week in an attempt to rid himself of "head cold" Denies altered mental status since hospital discharge. Completed sixth chemo and "hopes there will be no future chemo appt scheduled. Patient failed to show for 10/11/2013 follow up with [facility]

## 2013-10-14 NOTE — Progress Notes (Signed)
Radiation Oncology         [phone] ________________________________  Name: William Townsend MRN: [id number]  Date: 10/14/2013  DOB: December 14, 1960  Follow-Up Visit Note  CC: [name], DO  Floyce Stakes, MD  Diagnosis:   53 yo man with small cell lung cancer s/p resection of a brain mets s/p whole brain palliation of brain metastases 05/18/2013-06/01/2013 to a total dose of 30 Gy in 10 fractions.   Interval Since Last Radiation:  4  months  Narrative:  The patient returns today for routine follow-up.  Denies pain at this time. Denies headache, dizziness, nausea, vomiting, or ringing in the ear. Reports diminished vision. Reports difficulty sleeping last night. Explains that for two weeks following chemotherapy his body aches to the point all he can do is lay around.  Does not take decadron. Reports taking mucinex for two week in an attempt to rid himself of "head cold" Denies altered mental status since hospital discharge. Completed sixth chemo and "hopes there will be no future chemo appt scheduled. Patient failed to show for 10/11/2013 follow up with [facility].                                ALLERGIES:  is allergic to vicodin.  Meds: Current Outpatient Prescriptions  Medication Sig Dispense Refill  . guaiFENesin (MUCINEX) 600 MG 12 hr tablet Take by mouth 2 (two) times daily.      Marland Kitchen loratadine (CLARITIN) 10 MG tablet Take 1 tablet (10 mg total) by mouth daily as needed for allergies.  30 tablet  0  . omeprazole (PRILOSEC) 20 MG capsule Take 20 mg by mouth daily.      Marland Kitchen levofloxacin (LEVAQUIN) 500 MG tablet Take 1 tablet (500 mg total) by mouth daily.  7 tablet  0  . oxyCODONE-acetaminophen (PERCOCET/ROXICET) 5-325 MG per tablet Take 1 tablet by mouth every 6 (six) hours as needed for severe pain.       No current facility-administered medications for this encounter.    Physical Findings: The patient is in no acute distress. Patient is alert and oriented.  weight is 149 lb  3.2 oz (67.677 kg). His oral temperature is 98 F (36.7 C). His blood pressure is 161/93 and his pulse is 94. His respiration is 16 and oxygen saturation is 98%. . Hyperpigmentation without desquamation of forehead noted. Steady gait noted. No significant changes.  Lab Findings: Lab Results  Component Value Date   WBC 5.5 10/08/2013   HGB 9.7* 10/08/2013   HCT 26.5* 10/08/2013   MCV 86.3 10/08/2013   PLT 18* 10/08/2013    @LASTCHEM @  Radiographic Findings: Ct Head Wo Contrast  10/07/2013   CLINICAL DATA:  Vomiting. Confusion. History of metastatic cancer to the brain.  EXAM: CT HEAD WITHOUT CONTRAST  TECHNIQUE: Contiguous axial images were obtained from the base of the skull through the vertex without intravenous contrast.  COMPARISON:  MRI of the brain dated 04/29/2013 and CT scan dated 04/21/2013  FINDINGS: No mass lesion. No midline shift. No acute hemorrhage or hematoma. No extra-axial fluid collections. No evidence of acute infarction.  The patient has had any left posterior parietal craniotomy with a resection of metastatic lesion in the left occipital lobe. There is secondary encephalomalacia at that site.  There are no new lesions. No acute osseous abnormalities. Ventricles are normal.  IMPRESSION: No acute intracranial abnormality. Postsurgical changes in the left occipital lobe from resection  of previous metastasis.   Electronically Signed   By: Rozetta Nunnery M.D.   On: 10/07/2013 10:36   Ct Chest W Contrast  10/07/2013   CLINICAL DATA:  History of metastatic lung cancer.  EXAM: CT CHEST WITH CONTRAST  TECHNIQUE: Multidetector CT imaging of the chest was performed during intravenous contrast administration.  CONTRAST:  162mL OMNIPAQUE IOHEXOL 300 MG/ML  SOLN  COMPARISON:  CT scan of chest dated 08/31/2013  FINDINGS: Patchy cavitary lesion in the left upper lobe is slightly less prominent than on the prior study. The area of scarring in the right upper lobe is stable. Lungs are otherwise  clear. There is a stable 12 mm lymph node in the mid right hilum. No new adenopathy. Heart size is normal.  No osseous abnormality.  IMPRESSION: Further decrease in the size of the cavitary lesion in the left upper lobe. No other change.   Electronically Signed   By: Rozetta Nunnery M.D.   On: 10/07/2013 12:57   Mr Jeri Cos FY Contrast  10/08/2013   CLINICAL DATA:  Altered mental status. Lung cancer. Rule out metastatic disease. Postop tumor resection.  EXAM: MRI HEAD WITHOUT AND WITH CONTRAST  TECHNIQUE: Multiplanar, multiecho pulse sequences of the brain and surrounding structures were obtained without and with intravenous contrast.  CONTRAST:  15 mL MultiHance IV  COMPARISON:  CT 10/08/2011.  MRI 04/29/2013  FINDINGS: Left occipital craniotomy and resection of left occipital tumor. Mild amount of postoperative chronic blood products are present. Small subdural hematoma at the craniotomy site. Post contrast imaging in the area reveals no recurrent enhancing tumor.  No other metastatic deposits are present in the brain. No edema is present in the brain. Ventricle size is normal with mild atrophy. No midline shift.  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 No acute infarct.  Mucosal edema in the paranasal sinuses. Air-fluid level left maxillary sinus.  IMPRESSION: Postop left occipital craniotomy for resection of tumor. No locally recurrent tumor in this area.  Node other metastatic deposits in the brain.  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   Electronically Signed   By: Franchot Gallo M.D.   On: 10/08/2013 08:35   Ct Abdomen Pelvis W Contrast  10/07/2013   CLINICAL DATA:  Metastatic lung cancer.  EXAM: CT ABDOMEN AND PELVIS WITH CONTRAST  TECHNIQUE: Multidetector CT imaging of the abdomen and pelvis was performed using the standard  protocol following bolus administration of intravenous contrast.  CONTRAST:  173mL OMNIPAQUE IOHEXOL 300 MG/ML  SOLN  COMPARISON:  CT scan dated 08/31/2013  FINDINGS: Metastatic lesions in the right lobe of the liver are again noted. The large lesion in the mid right lobe measures 4.2 x 4.0 cm, essentially unchanged. The lesion adjacent to the gallbladder measures 3.6 x 2.9 cm, slightly smaller. The lesion at the inferior aspect of the right lobe measures 3.0 x 2.5 cm, essentially unchanged.  No new lesions. The spleen, pancreas, adrenal glands, and kidneys are normal. Bowel is normal. No acute osseous abnormalities.  IMPRESSION: Multiple metastatic lesions of the liver are essentially unchanged. No new lesions.   Electronically Signed   By: Rozetta Nunnery M.D.   On: 10/07/2013 13:00    Impression:  The patient is recovering from the effects of radiation.  His scans are stable with  no active disease.  Plan:  Repeat MRI in 3 months and then follow-up.  _____________________________________  Sheral Apley. Tammi Klippel, M.D.

## 2013-10-14 NOTE — Telephone Encounter (Signed)
CALLED PATIENT TO INFORM OF TEST AND FU VISIT FOR 01-2014, LVM FOR A RETURN CALL

## 2013-10-15 ENCOUNTER — Ambulatory Visit: Payer: Medicaid Other | Admitting: Family Medicine

## 2013-10-19 ENCOUNTER — Ambulatory Visit: Payer: Medicaid Other | Admitting: Internal Medicine

## 2013-10-19 ENCOUNTER — Telehealth: Payer: Self-pay | Admitting: Internal Medicine

## 2013-10-19 ENCOUNTER — Other Ambulatory Visit: Payer: Medicaid Other

## 2013-10-19 NOTE — Telephone Encounter (Signed)
pt called to r/s appt due to no transportation....done....pt aware of new d.t.

## 2013-10-21 ENCOUNTER — Other Ambulatory Visit (HOSPITAL_BASED_OUTPATIENT_CLINIC_OR_DEPARTMENT_OTHER): Payer: Medicaid Other

## 2013-10-21 ENCOUNTER — Ambulatory Visit (HOSPITAL_BASED_OUTPATIENT_CLINIC_OR_DEPARTMENT_OTHER): Payer: Medicaid Other | Admitting: Internal Medicine

## 2013-10-21 ENCOUNTER — Telehealth: Payer: Self-pay | Admitting: Internal Medicine

## 2013-10-21 ENCOUNTER — Encounter: Payer: Self-pay | Admitting: Internal Medicine

## 2013-10-21 VITALS — BP 160/86 | HR 91 | Temp 98.0°F | Resp 18 | Ht 72.0 in | Wt 149.6 lb

## 2013-10-21 DIAGNOSIS — C3410 Malignant neoplasm of upper lobe, unspecified bronchus or lung: Secondary | ICD-10-CM

## 2013-10-21 DIAGNOSIS — D61818 Other pancytopenia: Secondary | ICD-10-CM

## 2013-10-21 DIAGNOSIS — D6481 Anemia due to antineoplastic chemotherapy: Secondary | ICD-10-CM

## 2013-10-21 DIAGNOSIS — T451X5A Adverse effect of antineoplastic and immunosuppressive drugs, initial encounter: Secondary | ICD-10-CM

## 2013-10-21 DIAGNOSIS — C7949 Secondary malignant neoplasm of other parts of nervous system: Secondary | ICD-10-CM

## 2013-10-21 DIAGNOSIS — C3491 Malignant neoplasm of unspecified part of right bronchus or lung: Secondary | ICD-10-CM

## 2013-10-21 DIAGNOSIS — C7931 Secondary malignant neoplasm of brain: Secondary | ICD-10-CM

## 2013-10-21 DIAGNOSIS — C3490 Malignant neoplasm of unspecified part of unspecified bronchus or lung: Secondary | ICD-10-CM

## 2013-10-21 LAB — COMPREHENSIVE METABOLIC PANEL (CC13)
ALT: 21 U/L (ref 0–55)
AST: 28 U/L (ref 5–34)
Albumin: 3.8 g/dL (ref 3.5–5.0)
Alkaline Phosphatase: 138 U/L (ref 40–150)
Anion Gap: 11 mEq/L (ref 3–11)
BILIRUBIN TOTAL: 1.34 mg/dL — AB (ref 0.20–1.20)
BUN: 8.9 mg/dL (ref 7.0–26.0)
CO2: 25 mEq/L (ref 22–29)
Calcium: 8.7 mg/dL (ref 8.4–10.4)
Chloride: 105 mEq/L (ref 98–109)
Creatinine: 0.8 mg/dL (ref 0.7–1.3)
Glucose: 236 mg/dl — ABNORMAL HIGH (ref 70–140)
POTASSIUM: 3.4 meq/L — AB (ref 3.5–5.1)
SODIUM: 141 meq/L (ref 136–145)
TOTAL PROTEIN: 6.7 g/dL (ref 6.4–8.3)

## 2013-10-21 LAB — CBC WITH DIFFERENTIAL/PLATELET
BASO%: 0.2 % (ref 0.0–2.0)
Basophils Absolute: 0 10*3/uL (ref 0.0–0.1)
EOS%: 0 % (ref 0.0–7.0)
Eosinophils Absolute: 0 10*3/uL (ref 0.0–0.5)
HCT: 31.5 % — ABNORMAL LOW (ref 38.4–49.9)
HEMOGLOBIN: 10.9 g/dL — AB (ref 13.0–17.1)
LYMPH#: 0.8 10*3/uL — AB (ref 0.9–3.3)
LYMPH%: 14.2 % (ref 14.0–49.0)
MCH: 33.5 pg — ABNORMAL HIGH (ref 27.2–33.4)
MCHC: 34.6 g/dL (ref 32.0–36.0)
MCV: 96.9 fL (ref 79.3–98.0)
MONO#: 0.5 10*3/uL (ref 0.1–0.9)
MONO%: 8 % (ref 0.0–14.0)
NEUT#: 4.6 10*3/uL (ref 1.5–6.5)
NEUT%: 77.6 % — ABNORMAL HIGH (ref 39.0–75.0)
NRBC: 0 % (ref 0–0)
Platelets: 91 10*3/uL — ABNORMAL LOW (ref 140–400)
RBC: 3.25 10*6/uL — AB (ref 4.20–5.82)
RDW: 25.1 % — AB (ref 11.0–14.6)
WBC: 5.9 10*3/uL (ref 4.0–10.3)

## 2013-10-21 NOTE — Telephone Encounter (Signed)
Gave pt appt for lab and MD 2015 for April

## 2013-10-21 NOTE — Progress Notes (Signed)
William Townsend  Telephone:([phone] Fax:([phone]) [phone]  PROGRESS NOTE  William Putnam, DO [place] [address]  DIAGNOSIS: Extensive Stage small cell lung cancer diagnosed in September of 2014.  PRIOR THERAPY: Systemic chemotherapy with carboplatin for an AUC of 5 given on day 1 and etoposide 120 mg per meter squared given on days 1, 2 and 3 with Neulasta support given on day 4 status post 2 cycles. Carboplatin was reduced to AUC of 4 and etoposide 100 mg/M2 starting from cycle #6, last dose was 09/20/2013.  CURRENT THERAPY: Observation.  DISEASE STAGE: Extensive stage  CHEMOTHERAPY INTENT: Palliative  CURRENT # OF CHEMOTHERAPY CYCLES: 0   CURRENT ANTIEMETICS: Zofran, dexamethasone, Compazine  CURRENT SMOKING STATUS: Current smoker, strongly advised to quit smoking  ORAL CHEMOTHERAPY AND CONSENT: N./A.  CURRENT BISPHOSPHONATES USE: None  PAIN MANAGEMENT: Percocet  NARCOTICS INDUCED CONSTIPATION: None  LIVING WILL AND CODE STATUS: ?   INTERVAL HISTORY: William Townsend 53 y.o. male returns for a follow up visit. The patient is tolerating his current treatment with carboplatin and etoposide fairly well with no significant adverse effects except for increasing fatigue as well as pancytopenia. He denied having any significant chest pain, shortness of breath, cough or hemoptysis. The patient has no nausea or vomiting. He has no fever or chills. He has repeat CT scan of the chest, abdomen and pelvis performed recently and he is here for evaluation and discussion of his scan results.  MEDICAL HISTORY: Past Medical History  Diagnosis Date  . Atrial fibrillation   . Diabetes mellitus   . 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 Allergy   . Substance abuse   . Depression   . Stroke     3 previous strokes - Oct, Nov, and Dec 2012  . Throat cancer     September 2008  . Lung cancer     lobectomy in 2010  . Heart murmur   . Hx of radiation  therapy 05/18/13- 06/01/13    whole brain, 30 Gy, 10 fractions    ALLERGIES:  is allergic to vicodin.  MEDICATIONS:  Current Outpatient Prescriptions  Medication Sig Dispense Refill  . guaiFENesin (MUCINEX) 600 MG 12 hr tablet Take by mouth 2 (two) times daily.      Marland Kitchen loratadine (CLARITIN) 10 MG tablet Take 1 tablet (10 mg total) by mouth daily as needed for allergies.  30 tablet  0  . omeprazole (PRILOSEC) 20 MG capsule Take 20 mg by mouth daily.      Marland Kitchen oxyCODONE-acetaminophen (PERCOCET/ROXICET) 5-325 MG per tablet Take 1 tablet by mouth every 6 (six) hours as needed for severe pain.       No current facility-administered medications for this visit.    SURGICAL HISTORY:  Past Surgical History  Procedure Laterality Date  . Lobectomy    . Craniotomy Left 04/27/2013    Procedure: Left Occipital Craniotomy for resection of tumor with microscope;  Surgeon: Floyce Stakes, MD;  Location: [facility];  Service: Neurosurgery;  Laterality: Left;    REVIEW OF SYSTEMS:  Constitutional: positive for fatigue Eyes: negative Ears, nose, mouth, throat, and face: negative Respiratory: negative Cardiovascular: negative Gastrointestinal: negative Genitourinary:negative Integument/breast: negative Hematologic/lymphatic: negative Musculoskeletal:negative Neurological: negative Behavioral/Psych: negative Endocrine: negative Allergic/Immunologic: negative   PHYSICAL EXAMINATION: General appearance: alert, cooperative and no distress Head: Normocephalic, without obvious abnormality, atraumatic Neck: no adenopathy, no carotid bruit, no JVD, supple, symmetrical, trachea midline and thyroid not enlarged, symmetric, no tenderness/mass/nodules  Lymph nodes: Cervical, supraclavicular, and axillary nodes normal. Resp: clear to auscultation bilaterally Cardio: regular rate and rhythm, S1, S2 normal, no murmur, click, rub or gallop GI: soft, non-tender; bowel sounds normal; no masses,  no  organomegaly Extremities: extremities normal, atraumatic, no cyanosis or edema Neurologic: Alert and oriented X 3, normal strength and tone. Normal symmetric reflexes. Normal coordination and gait  ECOG PERFORMANCE STATUS: 1 - Symptomatic but completely ambulatory  Blood pressure 160/86, pulse 91, temperature 98 F (36.7 C), temperature source Oral, resp. rate 18, height 6' (1.829 m), weight 149 lb 9.6 oz (67.858 kg), SpO2 100.00%.  LABORATORY DATA: Lab Results  Component Value Date   WBC 5.9 10/21/2013   HGB 10.9* 10/21/2013   HCT 31.5* 10/21/2013   MCV 96.9 10/21/2013   PLT 91* 10/21/2013      Chemistry      Component Value Date/Time   NA 141 10/21/2013 1045   NA 136* 10/08/2013 0425   K 3.4* 10/21/2013 1045   K 3.8 10/08/2013 0425   CL 94* 10/08/2013 0425   CO2 25 10/21/2013 1045   CO2 26 10/08/2013 0425   BUN 8.9 10/21/2013 1045   BUN 11 10/08/2013 0425   CREATININE 0.8 10/21/2013 1045   CREATININE 0.64 10/08/2013 0425   CREATININE 1.04 01/28/2012 1454      Component Value Date/Time   CALCIUM 8.7 10/21/2013 1045   CALCIUM 7.1* 10/08/2013 0425   ALKPHOS 138 10/21/2013 1045   ALKPHOS 159* 10/07/2013 0945   AST 28 10/21/2013 1045   AST 29 10/07/2013 0945   ALT 21 10/21/2013 1045   ALT 23 10/07/2013 0945   BILITOT 1.34* 10/21/2013 1045   BILITOT 1.2 10/07/2013 0945       RADIOGRAPHIC STUDIES:  Ct Head Wo Contrast  10/07/2013   CLINICAL DATA:  Vomiting. Confusion. History of metastatic cancer to the brain.  EXAM: CT HEAD WITHOUT CONTRAST  TECHNIQUE: Contiguous axial images were obtained from the base of the skull through the vertex without intravenous contrast.  COMPARISON:  MRI of the brain dated 04/29/2013 and CT scan dated 04/21/2013  FINDINGS: No mass lesion. No midline shift. No acute hemorrhage or hematoma. No extra-axial fluid collections. No evidence of acute infarction.  The patient has had any left posterior parietal craniotomy with a resection of metastatic lesion in the left  occipital lobe. There is secondary encephalomalacia at that site.  There are no new lesions. No acute osseous abnormalities. Ventricles are normal.  IMPRESSION: No acute intracranial abnormality. Postsurgical changes in the left occipital lobe from resection of previous metastasis.   Electronically Signed   By: William Townsend M.D.   On: 10/07/2013 10:36   Ct Chest W Contrast  10/07/2013   CLINICAL DATA:  History of metastatic lung cancer.  EXAM: CT CHEST WITH CONTRAST  TECHNIQUE: Multidetector CT imaging of the chest was performed during intravenous contrast administration.  CONTRAST:  157mL OMNIPAQUE IOHEXOL 300 MG/ML  SOLN  COMPARISON:  CT scan of chest dated 08/31/2013  FINDINGS: Patchy cavitary lesion in the left upper lobe is slightly less prominent than on the prior study. The area of scarring in the right upper lobe is stable. Lungs are otherwise clear. There is a stable 12 mm lymph node in the mid right hilum. No new adenopathy. Heart size is normal.  No osseous abnormality.  IMPRESSION: Further decrease in the size of the cavitary lesion in the left upper lobe. No other change.   Electronically Signed   By: William Gulling  Townsend M.D.   On: 10/07/2013 12:57   Mr Jeri Cos AL Contrast  10/08/2013   CLINICAL DATA:  Altered mental status. Lung cancer. Rule out metastatic disease. Postop tumor resection.  EXAM: MRI HEAD WITHOUT AND WITH CONTRAST  TECHNIQUE: Multiplanar, multiecho pulse sequences of the brain and surrounding structures were obtained without and with intravenous contrast.  CONTRAST:  15 mL MultiHance IV  COMPARISON:  CT 10/08/2011.  MRI 04/29/2013  FINDINGS: Left occipital craniotomy and resection of left occipital tumor. Mild amount of postoperative chronic blood products are present. Small subdural hematoma at the craniotomy site. Post contrast imaging in the area reveals no recurrent enhancing tumor.  No other metastatic deposits are present in the brain. No edema is present in the brain. Ventricle  size is normal with mild atrophy. No midline shift.  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 No acute infarct.  Mucosal edema in the paranasal sinuses. Air-fluid level left maxillary sinus.  IMPRESSION: Postop left occipital craniotomy for resection of tumor. No locally recurrent tumor in this area.  Node other metastatic deposits in the brain.  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   Electronically Signed   By: William Townsend M.D.   On: 10/08/2013 08:35   Ct Abdomen Pelvis W Contrast  10/07/2013   CLINICAL DATA:  Metastatic lung cancer.  EXAM: CT ABDOMEN AND PELVIS WITH CONTRAST  TECHNIQUE: Multidetector CT imaging of the abdomen and pelvis was performed using the standard protocol following bolus administration of intravenous contrast.  CONTRAST:  180mL OMNIPAQUE IOHEXOL 300 MG/ML  SOLN  COMPARISON:  CT scan dated 08/31/2013  FINDINGS: Metastatic lesions in the right lobe of the liver are again noted. The large lesion in the mid right lobe measures 4.2 x 4.0 cm, essentially unchanged. The lesion adjacent to the gallbladder measures 3.6 x 2.9 cm, slightly smaller. The lesion at the inferior aspect of the right lobe measures 3.0 x 2.5 cm, essentially unchanged.  No new lesions. The spleen, pancreas, adrenal glands, and kidneys are normal. Bowel is normal. No acute osseous abnormalities.  IMPRESSION: Multiple metastatic lesions of the liver are essentially unchanged. No new lesions.   Electronically Signed   By: William Townsend M.D.   On: 10/07/2013 13:00     ASSESSMENT/PLAN:  This is a very pleasant 53 years old white male with extensive stage small cell lung cancer status post 6 cycles of systemic chemotherapy with carboplatin and etoposide. He tolerated his treatment fairly well except for increasing fatigue and weakness secondary to  chemotherapy-induced anemia. The patient also had pancytopenia after his chemotherapy which is currently improved.  His recent CT scan of the chest, abdomen and pelvis showed further improvement in his disease. I discussed the scan results with the patient today. I recommended for him to continue on observation for now with repeat CT scan of the chest, abdomen and pelvis in 2 months for restaging of his disease. He was advised to call immediately if he has any concerning symptoms in the interval. All questions were answered. The patient knows to call the clinic with any problems, questions or concerns. We can certainly see the patient much sooner if necessary.   I spent 15 minutes counseling the patient face to face. The total time spent in the appointment was 25 minutes.  William Gendron  K., MD 10/21/2013

## 2013-10-21 NOTE — Patient Instructions (Signed)
Smoking Cessation, Tips for Success If you are ready to quit smoking, congratulations! You have chosen to help yourself be healthier. Cigarettes bring nicotine, tar, carbon monoxide, and other irritants into your body. Your lungs, heart, and blood vessels will be able to work better without these poisons. There are many different ways to quit smoking. Nicotine gum, nicotine patches, a nicotine inhaler, or nicotine nasal spray can help with physical craving. Hypnosis, support groups, and medicines help break the habit of smoking. WHAT THINGS CAN I DO TO MAKE QUITTING EASIER?  Here are some tips to help you quit for good:  Pick a date when you will quit smoking completely. Tell all of your friends and family about your plan to quit on that date.  Do not try to slowly cut down on the number of cigarettes you are smoking. Pick a quit date and quit smoking completely starting on that day.  Throw away all cigarettes.   Clean and remove all ashtrays from your home, work, and car.   On a card, write down your reasons for quitting. Carry the card with you and read it when you get the urge to smoke.   Cleanse your body of nicotine. Drink enough water and fluids to keep your urine clear or pale yellow. Do this after quitting to flush the nicotine from your body.   Learn to predict your moods. Do not let a bad situation be your excuse to have a cigarette. Some situations in your life might tempt you into wanting a cigarette.   Never have "just one" cigarette. It leads to wanting another and another. Remind yourself of your decision to quit.   Change habits associated with smoking. If you smoked while driving or when feeling stressed, try other activities to replace smoking. Stand up when drinking your coffee. Brush your teeth after eating. Sit in a different chair when you read the paper. Avoid alcohol while trying to quit, and try to drink fewer caffeinated beverages. Alcohol and caffeine may urge  you to smoke.   Avoid foods and drinks that can trigger a desire to smoke, such as sugary or spicy foods and alcohol.   Ask people who smoke not to smoke around you.   Have something planned to do right after eating or having a cup of coffee. For example, plan to take a walk or exercise.   Try a relaxation exercise to calm you down and decrease your stress. Remember, you may be tense and nervous for the first 2 weeks after you quit, but this will pass.   Find new activities to keep your hands busy. Play with a pen, coin, or rubber band. Doodle or draw things on paper.   Brush your teeth right after eating. This will help cut down on the craving for the taste of tobacco after meals. You can also try mouthwash.   Use oral substitutes in place of cigarettes. Try using lemon drops, carrots, cinnamon sticks, or chewing gum. Keep them handy so they are available when you have the urge to smoke.   When you have the urge to smoke, try deep breathing.   Designate your home as a nonsmoking area.   If you are a heavy smoker, ask your health care provider about a prescription for nicotine chewing gum. It can ease your withdrawal from nicotine.   Reward yourself. Set aside the cigarette money you save and buy yourself something nice.   Look for support from others. Join a support group or   smoking cessation program. Ask someone at home or at work to help you with your plan to quit smoking.   Always ask yourself, "Do I need this cigarette or is this just a reflex?" Tell yourself, "Today, I choose not to smoke," or "I do not want to smoke." You are reminding yourself of your decision to quit.  Do not replace cigarette smoking with electronic cigarettes (commonly called e-cigarettes). The safety of e-cigarettes is unknown, and some may contain harmful chemicals.  If you relapse, do not give up! Plan ahead and think about what you will do the next time you get the urge to smoke.  HOW WILL  I FEEL WHEN I QUIT SMOKING? You may have symptoms of withdrawal because your body is used to nicotine (the addictive substance in cigarettes). You may crave cigarettes, be irritable, feel very hungry, cough often, get headaches, or have difficulty concentrating. The withdrawal symptoms are only temporary. They are strongest when you first quit but will go away within 10 14 days. When withdrawal symptoms occur, stay in control. Think about your reasons for quitting. Remind yourself that these are signs that your body is healing and getting used to being without cigarettes. Remember that withdrawal symptoms are easier to treat than the major diseases that smoking can cause.  Even after the withdrawal is over, expect periodic urges to smoke. However, these cravings are generally short lived and will go away whether you smoke or not. Do not smoke!  WHAT RESOURCES ARE AVAILABLE TO HELP ME QUIT SMOKING? Your health care provider can direct you to community resources or hospitals for support, which may include:  Group support.  Education.  Hypnosis.  Therapy. Document Released: 05/24/2004 Document Revised: 06/16/2013 Document Reviewed: 02/11/2013 [employer] Patient Information 2014 [employer].  

## 2013-11-22 ENCOUNTER — Telehealth: Payer: Self-pay | Admitting: *Deleted

## 2013-11-22 ENCOUNTER — Other Ambulatory Visit: Payer: Self-pay | Admitting: *Deleted

## 2013-11-22 ENCOUNTER — Encounter: Payer: Self-pay | Admitting: *Deleted

## 2013-11-22 DIAGNOSIS — R569 Unspecified convulsions: Secondary | ICD-10-CM

## 2013-11-22 DIAGNOSIS — C3490 Malignant neoplasm of unspecified part of unspecified bronchus or lung: Secondary | ICD-10-CM

## 2013-11-22 NOTE — Telephone Encounter (Signed)
Call back from [place] at [facility]: report they can not get him scanned there until Thursday as outpatient.

## 2013-11-22 NOTE — Telephone Encounter (Signed)
Reports having a seizure lasting approximately 3 minutes in her home Sunday. Eyelids were fluttering and entire body was rigid. Called 911 and taken to emergency room and released. No scans were done. This is the first seizure he has had that she is aware of. Has minimal recall of events from last week and was confused yesterday-mental status is clear today. Gait slightly unsteady due to weakness. Calling to ask what MD suggests needs to occur now? She is also interested in process of getting him to an assisted living facility and asking how to get this process started. Made her aware that I will be glad to get the social worker to call her with advice on going in this direction.

## 2013-11-22 NOTE — Telephone Encounter (Signed)
Central scheduling working on getting scan this Wednesday and will call back..per scheduling. Can have him scanned at 11 am tomorrow-nothing available for Wednesday. HIM department trying to get CT report from [facility] for MD to review. Call to radiology department at [facility] [phone]) which is hospital daughter said she could get him to tomorrow--they will check to see if he can be worked in [employer], but need a authorization/approval from [place] before they can perform scan. Juliann Pulse with speak w/her supervisor and call back if they can get him in tomorrow.

## 2013-11-22 NOTE — Telephone Encounter (Signed)
Made daughter aware that per CT head from [facility], his scan was negative for metastasis. Therefore Dr. Julien Nordmann said he does not need a MRI at this time, since seizure is unrelated to his cancer. Needs referral to neurologist and should be seen soon, wherever he is going to live--[place] or [facility] area. Daughter reports she is bringing him to [facility] and will be living here a few months. Wants him seen by neurologist in [place], feeling the medical care is better here. Made her aware that referral will be sent.

## 2013-11-22 NOTE — Progress Notes (Signed)
[place] [facility]  Clinical Social Work received referral from [employer] regarding transportation needs.  CSW spoke with patient's daughter, Joseph Art.  Renee reports her concern is regarding placement for her father.  She shared he recently had a seizure and she is unable to care for him at home.  We briefly discussed alternative living situations such as Assisted Living and Skilled Nursing.  CSW instructed patient's daughter to call her desired facility and determine if facility will accept patient's payor source.  CSW will help coordinate medical information needed from facility.  Polo Riley, MSW, LCSW, OSW-C Clinical Social Worker [facility] [phone]

## 2013-11-26 ENCOUNTER — Telehealth: Payer: Self-pay | Admitting: Internal Medicine

## 2013-11-26 NOTE — Telephone Encounter (Signed)
lvm for pt regarding to pt need to sched own appt for dentist..Marland KitchenMarland Kitchen

## 2013-11-26 NOTE — Telephone Encounter (Signed)
lmonvm for diane, np coord @ [facility] [phone]) requesting appt. referral in [facility] to [place]. no other orders per 3/16 pof.

## 2013-11-26 NOTE — Telephone Encounter (Signed)
Received call back from [place] @ [facility]. per Shauna Hugh pt is scheduled w/Dr. Jannifer Franklin 3/23 and dtr is aware. appt can also be seen from appt desk.

## 2013-12-02 ENCOUNTER — Ambulatory Visit (INDEPENDENT_AMBULATORY_CARE_PROVIDER_SITE_OTHER): Payer: Medicaid Other | Admitting: Neurology

## 2013-12-02 ENCOUNTER — Encounter: Payer: Self-pay | Admitting: Neurology

## 2013-12-02 ENCOUNTER — Encounter (INDEPENDENT_AMBULATORY_CARE_PROVIDER_SITE_OTHER): Payer: Self-pay

## 2013-12-02 VITALS — BP 140/89 | HR 116 | Ht 73.0 in | Wt 153.0 lb

## 2013-12-02 DIAGNOSIS — G40209 Localization-related (focal) (partial) symptomatic epilepsy and epileptic syndromes with complex partial seizures, not intractable, without status epilepticus: Secondary | ICD-10-CM

## 2013-12-02 MED ORDER — LEVETIRACETAM 500 MG PO TABS: ORAL_TABLET | ORAL | Status: DC

## 2013-12-02 NOTE — Patient Instructions (Addendum)
Marland Kitchenckwinf   Epilepsy Epilepsy is a disorder in which a person has repeated seizures over time. A seizure is a release of abnormal electrical activity in the brain. Seizures can cause a change in attention, behavior, or the ability to remain awake and alert (altered mental status). Seizures often involve uncontrollable shaking (convulsions).  Most people with epilepsy lead normal lives. However, people with epilepsy are at an increased risk of falls, accidents, and injuries. Therefore, it is important to begin treatment right away. CAUSES  Epilepsy has many possible causes. Anything that disturbs the normal pattern of brain cell activity can lead to seizures. This may include:   Head injury.  Birth trauma.  High fever as a child.  Stroke.  Bleeding into or around the brain.  Certain drugs.  Prolonged low oxygen, such as what occurs after CPR efforts.  Abnormal brain development.  Certain illnesses, such as meningitis, encephalitis (brain infection), malaria, and other infections.  An imbalance of nerve signaling chemicals (neurotransmitters).  SIGNS AND SYMPTOMS  The symptoms of a seizure can vary greatly from one person to another. Right before a seizure, you may have a warning (aura) that a seizure is about to occur. An aura may include the following symptoms:  Fear or anxiety.  Nausea.  Feeling like the room is spinning (vertigo).  Vision changes, such as seeing flashing lights or spots. Common symptoms during a seizure include:  Abnormal sensations, such as an abnormal smell or a bitter taste in the mouth.   Sudden, general body stiffness.   Convulsions that involve rhythmic jerking of the face, arm, or leg on one or both sides.   Sudden change in consciousness.   Appearing to be awake but not responding.   Appearing to be asleep but cannot be awakened.   Grimacing, chewing, lip smacking, drooling, tongue biting, or loss of bowel or bladder control. After  a seizure, you may feel sleepy for a while. DIAGNOSIS  Your health care provider will ask about your symptoms and take a medical history. Descriptions from any witnesses to your seizures will be very helpful in the diagnosis. A physical exam, including a detailed neurological exam, is necessary. Various tests may be done, such as:   An electroencephalogram (EEG). This is a painless test of your brain waves. In this test, a diagram is created of your brain waves. These diagrams can be interpreted by a specialist.  An MRI of the brain.   A CT scan of the brain.   A spinal tap (lumbar puncture, LP).  Blood tests to check for signs of infection or abnormal blood chemistry. TREATMENT  There is no cure for epilepsy, but it is generally treatable. Once epilepsy is diagnosed, it is important to begin treatment as soon as possible. For most people with epilepsy, seizures can be controlled with medicines. The following may also be used:  A pacemaker for the brain (vagus nerve stimulator) can be used for people with seizures that are not well controlled by medicine.  Surgery on the brain. For some people, epilepsy eventually goes away. HOME CARE INSTRUCTIONS   Follow your health care provider's recommendations on driving and safety in normal activities.  Get enough rest. Lack of sleep can cause seizures.  Only take over-the-counter or prescription medicines as directed by your health care provider. Take any prescribed medicine exactly as directed.  Avoid any known triggers of your seizures.  Keep a seizure diary. Record what you recall about any seizure, especially any possible trigger.  Make sure the people you live and work with know that you are prone to seizures. They should receive instructions on how to help you. In general, a witness to a seizure should:   Cushion your head and body.   Turn you on your side.   Avoid unnecessarily restraining you.   Not place anything  inside your mouth.   Call for emergency medical help if there is any question about what has occurred.   Follow up with your health care provider as directed. You may need regular blood tests to monitor the levels of your medicine.  SEEK MEDICAL CARE IF:   You develop signs of infection or other illness. This might increase the risk of a seizure.   You seem to be having more frequent seizures.   Your seizure pattern is changing.  SEEK IMMEDIATE MEDICAL CARE IF:   You have a seizure that does not stop after a few moments.   You have a seizure that causes any difficulty in breathing.   You have a seizure that results in a very severe headache.   You have a seizure that leaves you with the inability to speak or use a part of your body.  Document Released: 08/26/2005 Document Revised: 06/16/2013 Document Reviewed: 04/07/2013 [facility] Patient Information 2014 [place].

## 2013-12-02 NOTE — Progress Notes (Signed)
Reason for visit: Seizure  William Townsend is a 53 y.o. 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 The patient has had a craniotomy in the past to resect a metastatic brain tumor. The patient has gotten whole brain radiation, and chemotherapy. The patient continues to smoke. The patient indicates that in January of 2015, he had an event where he seemed to be staggery and confused. The patient was unsure at that time what had happened. The patient was visiting his daughter on 11/20/2013. The patient had a witnessed event of "staring off". The patient was confused afterwards, and the patient has no recollection of events for 4 to 5 hours. The patient lost control the bowels and the bladder, but he did not bite his tongue. The patient had no warning of the event. The patient does not operate a motor vehicle currently. The patient has had some visual changes following the craniotomy surgery. The patient denies headaches, but he has some numbness in the feet, he denies any new numbness of the arms or legs recently. The patient has some balance issues associated with the numbness in the feet. The patient is sent to this office for an evaluation.  Past Medical History  Diagnosis Date  . Atrial fibrillation   . Diabetes mellitus   . 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 Allergy   . Substance abuse   . Depression   . Stroke     3 previous strokes - Oct, Nov, and Dec 2012  . Throat cancer     September 2008  . Lung cancer     lobectomy in 2010  . Heart murmur   . Hx of radiation therapy 05/18/13- 06/01/13    whole brain, 30 Gy, 10 fractions    Past Surgical History  Procedure Laterality Date  . Lobectomy    . Craniotomy Left 04/27/2013    Procedure: Left Occipital Craniotomy for resection of tumor with microscope;  Surgeon: Floyce Stakes, MD;  Location: [facility];  Service:  Neurosurgery;  Laterality: Left;    Family History  Problem Relation Age of Onset  . Diabetes Mother   . Early death Mother   . Heart disease Father   . Stroke Father   . Asthma Sister   . Depression Sister   . Seizures Neg Hx     Social history:  reports that he has been smoking Cigarettes.  He has a 15 pack-year smoking history. He has never used smokeless tobacco. He reports that he drinks alcohol. He reports that he uses illicit drugs (Marijuana).  Medications:  Current Outpatient Prescriptions on File Prior to Visit  Medication Sig Dispense Refill  . guaiFENesin (MUCINEX) 600 MG 12 hr tablet Take by mouth 2 (two) times daily.      Marland Kitchen loratadine (CLARITIN) 10 MG tablet Take 1 tablet (10 mg total) by mouth daily as needed for allergies.  30 tablet  0  . omeprazole (PRILOSEC) 20 MG capsule Take 20 mg by mouth daily.       No current facility-administered medications on file prior to visit.      Allergies  Allergen Reactions  . Vicodin [Hydrocodone-Acetaminophen] Itching    ROS:  Out of a complete 14 system review of symptoms, the patient complains only of the following symptoms, and all other reviewed systems are negative.  Weight loss  Ringing in the ears Blurred vision Increased thirst Allergies Seizure Insomnia, change in appetite  Blood pressure 140/89, pulse 116, height 6\' 1"  (1.854 m), weight 153 lb (69.4 kg).  Physical Exam  General: The patient is alert and cooperative at the time of the examination.  Eyes: Pupils are equal, round, and reactive to light. Discs are flat bilaterally.  Neck: The neck is supple, no carotid bruits are noted.  Respiratory: The respiratory examination is notable for occasional bilateral wheezes .  Cardiovascular: The cardiovascular examination reveals a regular rate and rhythm, no obvious murmurs or rubs are noted.  Skin: Extremities are without significant edema.  Neurologic Exam  Mental status: The patient is alert and  oriented x 3 at the time of the examination. The patient has apparent normal recent and remote memory, with an apparently normal attention span and concentration ability.  Cranial nerves: Facial symmetry is present. There is good sensation of the face to pinprick and soft touch bilaterally. The strength of the facial muscles and the muscles to head turning and shoulder shrug are normal bilaterally. Speech is well enunciated, no aphasia or dysarthria is noted. Extraocular movements are full. Visual fields are full. The tongue is midline, and the patient has symmetric elevation of the soft palate. No obvious hearing deficits are noted.  Motor: The motor testing reveals 5 over 5 strength of all 4 extremities. Good symmetric motor tone is noted throughout.  Sensory: Sensory testing is intact to pinprick, soft touch, vibration sensation, and position sense on all 4 extremities, with exception that pinprick sensation is decreased on the right foot, position sense is decreased bilaterally in the feet. No evidence of extinction is noted.  Coordination: Cerebellar testing reveals good finger-nose-finger and heel-to-shin bilaterally.  Gait and station: Gait is normal. Tandem gait is unsteady. Romberg is negative, but is unsteady. No drift is seen.  Reflexes: Deep tendon reflexes are notable for an increase in the left biceps reflex as compared to the right. The ankle jerk reflexes are depressed bilaterally. Toes are downgoing bilaterally.   MRI brain 10/08/13:  IMPRESSION:  Postop left occipital craniotomy for resection of tumor. No locally  recurrent tumor in this area.    Assessment/Plan:  1. Small cell carcinoma, metastatic to brain  2. History craniotomy, left occipital  3. Seizure event  The patient likely had a seizure in January 2015, and a recurrent event in March of 2015. The patient will be placed on Keppra at this time starting on 250 mg twice daily for 2 weeks, and then going to 500  mg twice daily. The patient will undergo an EEG study. The patient indicates that he will have a repeat MRI of the brain in April of 2015. The patient will followup through this office in 4 or 5 months. The patient does not operate a motor vehicle.  Jill Alexanders MD 12/02/2013 7:57 PM  [facility] [address] [place], [place] [address]  Phone [phone] Fax [phone]

## 2013-12-06 ENCOUNTER — Telehealth: Payer: Self-pay | Admitting: *Deleted

## 2013-12-06 NOTE — Telephone Encounter (Signed)
Pt called and left msg stating that he received a denial letter and needed to talk to someone.  Msg forwarded to Raquel in medical management.  SLJ

## 2013-12-13 ENCOUNTER — Other Ambulatory Visit: Payer: Medicaid Other | Admitting: Radiology

## 2013-12-16 ENCOUNTER — Encounter: Payer: Self-pay | Admitting: Internal Medicine

## 2013-12-16 ENCOUNTER — Other Ambulatory Visit: Payer: Self-pay | Admitting: *Deleted

## 2013-12-16 DIAGNOSIS — C3490 Malignant neoplasm of unspecified part of unspecified bronchus or lung: Secondary | ICD-10-CM

## 2013-12-16 NOTE — Progress Notes (Signed)
Called the pat bk and advised to disrgard last mess and he needs to get with DSS to see why medicaid denying upcoming visits???

## 2013-12-16 NOTE — Progress Notes (Signed)
Called and left case worker Payton Emerald [phone] a message to call the patient to advise why he recd letter? And also advised to call me bck to advise his medicaid is active. See prev notes. Patient advised recd letter of visits will not be covered???

## 2013-12-16 NOTE — Progress Notes (Signed)
Called patient back from mess left?? Advised him to call back and let me know reason for call??

## 2013-12-17 ENCOUNTER — Encounter (HOSPITAL_COMMUNITY): Payer: Self-pay

## 2013-12-17 ENCOUNTER — Ambulatory Visit (HOSPITAL_COMMUNITY)
Admission: RE | Admit: 2013-12-17 | Discharge: 2013-12-17 | Disposition: A | Discharge status: Home / Self Care | Payer: Medicaid Other | Source: Ambulatory Visit | Attending: Internal Medicine | Admitting: Internal Medicine

## 2013-12-17 ENCOUNTER — Other Ambulatory Visit (HOSPITAL_BASED_OUTPATIENT_CLINIC_OR_DEPARTMENT_OTHER): Payer: Medicaid Other

## 2013-12-17 ENCOUNTER — Encounter: Payer: Self-pay | Admitting: Internal Medicine

## 2013-12-17 DIAGNOSIS — C787 Secondary malignant neoplasm of liver and intrahepatic bile duct: Secondary | ICD-10-CM | POA: Insufficient documentation

## 2013-12-17 DIAGNOSIS — I7090 Unspecified atherosclerosis: Secondary | ICD-10-CM | POA: Insufficient documentation

## 2013-12-17 DIAGNOSIS — K5710 Diverticulosis of small intestine without perforation or abscess without bleeding: Secondary | ICD-10-CM | POA: Insufficient documentation

## 2013-12-17 DIAGNOSIS — K769 Liver disease, unspecified: Secondary | ICD-10-CM | POA: Insufficient documentation

## 2013-12-17 DIAGNOSIS — I2510 Atherosclerotic heart disease of native coronary artery without angina pectoris: Secondary | ICD-10-CM | POA: Insufficient documentation

## 2013-12-17 DIAGNOSIS — C3490 Malignant neoplasm of unspecified part of unspecified bronchus or lung: Secondary | ICD-10-CM | POA: Diagnosis not present

## 2013-12-17 DIAGNOSIS — Z902 Acquired absence of lung [part of]: Secondary | ICD-10-CM | POA: Diagnosis not present

## 2013-12-17 DIAGNOSIS — C7931 Secondary malignant neoplasm of brain: Secondary | ICD-10-CM | POA: Diagnosis not present

## 2013-12-17 DIAGNOSIS — C7949 Secondary malignant neoplasm of other parts of nervous system: Secondary | ICD-10-CM

## 2013-12-17 DIAGNOSIS — K439 Ventral hernia without obstruction or gangrene: Secondary | ICD-10-CM | POA: Diagnosis not present

## 2013-12-17 DIAGNOSIS — Z9221 Personal history of antineoplastic chemotherapy: Secondary | ICD-10-CM | POA: Insufficient documentation

## 2013-12-17 DIAGNOSIS — C3410 Malignant neoplasm of upper lobe, unspecified bronchus or lung: Secondary | ICD-10-CM

## 2013-12-17 DIAGNOSIS — C099 Malignant neoplasm of tonsil, unspecified: Secondary | ICD-10-CM | POA: Diagnosis not present

## 2013-12-17 DIAGNOSIS — C3491 Malignant neoplasm of unspecified part of right bronchus or lung: Secondary | ICD-10-CM

## 2013-12-17 LAB — CBC WITH DIFFERENTIAL/PLATELET
BASO%: 0.4 % (ref 0.0–2.0)
Basophils Absolute: 0 10*3/uL (ref 0.0–0.1)
EOS%: 0.9 % (ref 0.0–7.0)
Eosinophils Absolute: 0 10*3/uL (ref 0.0–0.5)
HEMATOCRIT: 45.6 % (ref 38.4–49.9)
HGB: 16.2 g/dL (ref 13.0–17.1)
LYMPH%: 19.9 % (ref 14.0–49.0)
MCH: 37.2 pg — AB (ref 27.2–33.4)
MCHC: 35.4 g/dL (ref 32.0–36.0)
MCV: 104.9 fL — ABNORMAL HIGH (ref 79.3–98.0)
MONO#: 0.5 10*3/uL (ref 0.1–0.9)
MONO%: 10.7 % (ref 0.0–14.0)
NEUT#: 3.3 10*3/uL (ref 1.5–6.5)
NEUT%: 68.1 % (ref 39.0–75.0)
Platelets: 120 10*3/uL — ABNORMAL LOW (ref 140–400)
RBC: 4.35 10*6/uL (ref 4.20–5.82)
RDW: 15.6 % — ABNORMAL HIGH (ref 11.0–14.6)
WBC: 4.9 10*3/uL (ref 4.0–10.3)
lymph#: 1 10*3/uL (ref 0.9–3.3)

## 2013-12-17 LAB — COMPREHENSIVE METABOLIC PANEL (CC13)
ALT: 13 U/L (ref 0–55)
AST: 36 U/L — ABNORMAL HIGH (ref 5–34)
Albumin: 3.9 g/dL (ref 3.5–5.0)
Alkaline Phosphatase: 147 U/L (ref 40–150)
Anion Gap: 14 mEq/L — ABNORMAL HIGH (ref 3–11)
BILIRUBIN TOTAL: 1.04 mg/dL (ref 0.20–1.20)
BUN: 6.5 mg/dL — ABNORMAL LOW (ref 7.0–26.0)
CO2: 24 mEq/L (ref 22–29)
CREATININE: 0.9 mg/dL (ref 0.7–1.3)
Calcium: 8.9 mg/dL (ref 8.4–10.4)
Chloride: 100 mEq/L (ref 98–109)
Glucose: 304 mg/dl — ABNORMAL HIGH (ref 70–140)
Potassium: 3.9 mEq/L (ref 3.5–5.1)
Sodium: 138 mEq/L (ref 136–145)
Total Protein: 7.4 g/dL (ref 6.4–8.3)

## 2013-12-17 MED ORDER — IOHEXOL 300 MG/ML  SOLN
100.0000 mL | Freq: Once | INTRAMUSCULAR | Status: AC | PRN
  Administered 2013-12-17: 100 mL via INTRAVENOUS

## 2013-12-17 NOTE — Progress Notes (Signed)
Per DSS.I need to call Ms. Loletha Grayer for this patient's case Insurance underwriter. Left a message for Ms. Loletha Grayer to call. He has SSI and trying to get regular medicaid??

## 2013-12-21 ENCOUNTER — Ambulatory Visit (HOSPITAL_BASED_OUTPATIENT_CLINIC_OR_DEPARTMENT_OTHER): Payer: Medicaid Other | Admitting: Internal Medicine

## 2013-12-21 ENCOUNTER — Encounter: Payer: Self-pay | Admitting: Internal Medicine

## 2013-12-21 VITALS — BP 158/111 | HR 112 | Temp 98.2°F | Resp 19 | Ht 73.0 in | Wt 156.9 lb

## 2013-12-21 DIAGNOSIS — C779 Secondary and unspecified malignant neoplasm of lymph node, unspecified: Secondary | ICD-10-CM

## 2013-12-21 DIAGNOSIS — C787 Secondary malignant neoplasm of liver and intrahepatic bile duct: Secondary | ICD-10-CM

## 2013-12-21 DIAGNOSIS — C3410 Malignant neoplasm of upper lobe, unspecified bronchus or lung: Secondary | ICD-10-CM

## 2013-12-21 DIAGNOSIS — C3490 Malignant neoplasm of unspecified part of unspecified bronchus or lung: Secondary | ICD-10-CM

## 2013-12-21 DIAGNOSIS — C50919 Malignant neoplasm of unspecified site of unspecified female breast: Secondary | ICD-10-CM

## 2013-12-21 NOTE — Patient Instructions (Signed)
Smoking Cessation Quitting smoking is important to your health and has many advantages. However, it is not always easy to quit since nicotine is a very addictive drug. Often times, people try 3 times or more before being able to quit. This document explains the best ways for you to prepare to quit smoking. Quitting takes hard work and a lot of effort, but you can do it. ADVANTAGES OF QUITTING SMOKING  You will live longer, feel better, and live better.  Your body will feel the impact of quitting smoking almost immediately.  Within 20 minutes, blood pressure decreases. Your pulse returns to its normal level.  After 8 hours, carbon monoxide levels in the blood return to normal. Your oxygen level increases.  After 24 hours, the chance of having a heart attack starts to decrease. Your breath, hair, and body stop smelling like smoke.  After 48 hours, damaged nerve endings begin to recover. Your sense of taste and smell improve.  After 72 hours, the body is virtually free of nicotine. Your bronchial tubes relax and breathing becomes easier.  After 2 to 12 weeks, lungs can hold more air. Exercise becomes easier and circulation improves.  The risk of having a heart attack, stroke, cancer, or lung disease is greatly reduced.  After 1 year, the risk of coronary heart disease is cut in half.  After 5 years, the risk of stroke falls to the same as a nonsmoker.  After 10 years, the risk of lung cancer is cut in half and the risk of other cancers decreases significantly.  After 15 years, the risk of coronary heart disease drops, usually to the level of a nonsmoker.  If you are pregnant, quitting smoking will improve your chances of having a healthy baby.  The people you live with, especially any children, will be healthier.  You will have extra money to spend on things other than cigarettes. QUESTIONS TO THINK ABOUT BEFORE ATTEMPTING TO QUIT You may want to talk about your answers with your  caregiver.  Why do you want to quit?  If you tried to quit in the past, what helped and what did not?  What will be the most difficult situations for you after you quit? How will you plan to handle them?  Who can help you through the tough times? Your family? Friends? A caregiver?  What pleasures do you get from smoking? What ways can you still get pleasure if you quit? Here are some questions to ask your caregiver:  How can you help me to be successful at quitting?  What medicine do you think would be best for me and how should I take it?  What should I do if I need more help?  What is smoking withdrawal like? How can I get information on withdrawal? GET READY  Set a quit date.  Change your environment by getting rid of all cigarettes, ashtrays, matches, and lighters in your home, car, or work. Do not let people smoke in your home.  Review your past attempts to quit. Think about what worked and what did not. GET SUPPORT AND ENCOURAGEMENT You have a better chance of being successful if you have help. You can get support in many ways.  Tell your family, friends, and co-workers that you are going to quit and need their support. Ask them not to smoke around you.  Get individual, group, or telephone counseling and support. Programs are available at local hospitals and health centers. Call your local health department for   information about programs in your area.  Spiritual beliefs and practices may help some smokers quit.  Download a "quit meter" on your computer to keep track of quit statistics, such as how long you have gone without smoking, cigarettes not smoked, and money saved.  Get a self-help book about quitting smoking and staying off of tobacco. LEARN NEW SKILLS AND BEHAVIORS  Distract yourself from urges to smoke. Talk to someone, go for a walk, or occupy your time with a task.  Change your normal routine. Take a different route to work. Drink tea instead of coffee.  Eat breakfast in a different place.  Reduce your stress. Take a hot bath, exercise, or read a book.  Plan something enjoyable to do every day. Reward yourself for not smoking.  Explore interactive web-based programs that specialize in helping you quit. GET MEDICINE AND USE IT CORRECTLY Medicines can help you stop smoking and decrease the urge to smoke. Combining medicine with the above behavioral methods and support can greatly increase your chances of successfully quitting smoking.  Nicotine replacement therapy helps deliver nicotine to your body without the negative effects and risks of smoking. Nicotine replacement therapy includes nicotine gum, lozenges, inhalers, nasal sprays, and skin patches. Some may be available over-the-counter and others require a prescription.  Antidepressant medicine helps people abstain from smoking, but how this works is unknown. This medicine is available by prescription.  Nicotinic receptor partial agonist medicine simulates the effect of nicotine in your brain. This medicine is available by prescription. Ask your caregiver for advice about which medicines to use and how to use them based on your health history. Your caregiver will tell you what side effects to look out for if you choose to be on a medicine or therapy. Carefully read the information on the package. Do not use any other product containing nicotine while using a nicotine replacement product.  RELAPSE OR DIFFICULT SITUATIONS Most relapses occur within the first 3 months after quitting. Do not be discouraged if you start smoking again. Remember, most people try several times before finally quitting. You may have symptoms of withdrawal because your body is used to nicotine. You may crave cigarettes, be irritable, feel very hungry, cough often, get headaches, or have difficulty concentrating. The withdrawal symptoms are only temporary. They are strongest when you first quit, but they will go away within  10 14 days. To reduce the chances of relapse, try to:  Avoid drinking alcohol. Drinking lowers your chances of successfully quitting.  Reduce the amount of caffeine you consume. Once you quit smoking, the amount of caffeine in your body increases and can give you symptoms, such as a rapid heartbeat, sweating, and anxiety.  Avoid smokers because they can make you want to smoke.  Do not let weight gain distract you. Many smokers will gain weight when they quit, usually less than 10 pounds. Eat a healthy diet and stay active. You can always lose the weight gained after you quit.  Find ways to improve your mood other than smoking. FOR MORE INFORMATION  www.smokefree.gov  Document Released: 08/20/2001 Document Revised: 02/25/2012 Document Reviewed: 12/05/2011 [employer] Patient Information 2014 [employer].  

## 2013-12-21 NOTE — Progress Notes (Signed)
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  PRIOR THERAPY: Systemic chemotherapy with carboplatin for an AUC of 5 given on day 1 and etoposide 120 mg per meter squared given on days 1, 2 and 3 with Neulasta support given on day 4 status post 2 cycles. Carboplatin was reduced to AUC of 4 and etoposide 100 mg/M2 starting from cycle #6, last dose was 09/20/2013.  CURRENT THERAPY: Observation.  DISEASE STAGE: Extensive stage  CHEMOTHERAPY INTENT: Palliative  CURRENT # OF CHEMOTHERAPY CYCLES: 0   CURRENT ANTIEMETICS: Zofran, dexamethasone, Compazine  CURRENT SMOKING STATUS: Current smoker, strongly advised to quit smoking  ORAL CHEMOTHERAPY AND CONSENT: N./A.  CURRENT BISPHOSPHONATES USE: None  PAIN MANAGEMENT: Percocet  NARCOTICS INDUCED CONSTIPATION: None  LIVING WILL AND CODE STATUS: ?   INTERVAL HISTORY: William Townsend 53 y.o. male returns for a follow up visit. He has been observation for the last 3 months. He has 2 episodes of seizure activity in January of 2015 and the other one in March of 2015 when he was visiting his daughter in [place].  He was seen recently by Dr. Jannifer Franklin and [facility] neurology and currently on treatment with Keppra 250 mg by mouth twice a day.  He denied having any significant chest pain, shortness of breath, cough or hemoptysis. The patient has no nausea or vomiting. He has no fever or chills. He has repeat CT scan of the chest, abdomen and pelvis performed recently and he is here for evaluation and discussion of his scan results.  MEDICAL HISTORY: Past Medical History  Diagnosis Date  . Atrial fibrillation   . Diabetes mellitus   . 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 Allergy   . Substance abuse   . Depression   . Stroke     3 previous  strokes - Oct, Nov, and Dec 2012  . Throat cancer     September 2008  . Lung cancer     lobectomy in 2010  . Heart murmur   . Hx of radiation therapy 05/18/13- 06/01/13    whole brain, 30 Gy, 10 fractions    ALLERGIES:  is allergic to vicodin.  MEDICATIONS:  Current Outpatient Prescriptions  Medication Sig Dispense Refill  . docusate sodium (COLACE) 100 MG capsule Take 100 mg by mouth 2 (two) times daily.      Marland Kitchen guaiFENesin (MUCINEX) 600 MG 12 hr tablet Take by mouth 2 (two) times daily.      Marland Kitchen levETIRAcetam (KEPPRA) 500 MG tablet One half tablet twice a day for 2 weeks, then take one tablet twice a day  60 tablet  5  . loratadine (CLARITIN) 10 MG tablet Take 1 tablet (10 mg total) by mouth daily as needed for allergies.  30 tablet  0  . omeprazole (PRILOSEC) 20 MG capsule Take 20 mg by mouth daily.       No current facility-administered medications for this visit.    SURGICAL HISTORY:  Past Surgical History  Procedure Laterality Date  . Lobectomy    . Craniotomy Left 04/27/2013    Procedure: Left Occipital Craniotomy for resection of tumor with microscope;  Surgeon: Floyce Stakes, MD;  Location: [facility];  Service: Neurosurgery;  Laterality: Left;    REVIEW OF SYSTEMS:  Constitutional: positive for fatigue Eyes: negative Ears, nose, mouth,  throat, and face: negative Respiratory: negative Cardiovascular: negative Gastrointestinal: negative Genitourinary:negative Integument/breast: negative Hematologic/lymphatic: negative Musculoskeletal:negative Neurological: negative Behavioral/Psych: negative Endocrine: negative Allergic/Immunologic: negative   PHYSICAL EXAMINATION: General appearance: alert, cooperative and no distress Head: Normocephalic, without obvious abnormality, atraumatic Neck: no adenopathy, no carotid bruit, no JVD, supple, symmetrical, trachea midline and thyroid not enlarged, symmetric, no tenderness/mass/nodules Lymph nodes: Cervical,  supraclavicular, and axillary nodes normal. Resp: clear to auscultation bilaterally Cardio: regular rate and rhythm, S1, S2 normal, no murmur, click, rub or gallop GI: soft, non-tender; bowel sounds normal; no masses,  no organomegaly Extremities: extremities normal, atraumatic, no cyanosis or edema Neurologic: Alert and oriented X 3, normal strength and tone. Normal symmetric reflexes. Normal coordination and gait  ECOG PERFORMANCE STATUS: 1 - Symptomatic but completely ambulatory  Blood pressure 158/111, pulse 112, temperature 98.2 F (36.8 C), temperature source Oral, resp. rate 19, height 6\' 1"  (1.854 m), weight 156 lb 14.4 oz (71.169 kg), SpO2 100.00%.  LABORATORY DATA: Lab Results  Component Value Date   WBC 4.9 12/17/2013   HGB 16.2 12/17/2013   HCT 45.6 12/17/2013   MCV 104.9* 12/17/2013   PLT 120* 12/17/2013      Chemistry      Component Value Date/Time   NA 138 12/17/2013 0844   NA 136* 10/08/2013 0425   K 3.9 12/17/2013 0844   K 3.8 10/08/2013 0425   CL 94* 10/08/2013 0425   CO2 24 12/17/2013 0844   CO2 26 10/08/2013 0425   BUN 6.5* 12/17/2013 0844   BUN 11 10/08/2013 0425   CREATININE 0.9 12/17/2013 0844   CREATININE 0.64 10/08/2013 0425   CREATININE 1.04 01/28/2012 1454      Component Value Date/Time   CALCIUM 8.9 12/17/2013 0844   CALCIUM 7.1* 10/08/2013 0425   ALKPHOS 147 12/17/2013 0844   ALKPHOS 159* 10/07/2013 0945   AST 36* 12/17/2013 0844   AST 29 10/07/2013 0945   ALT 13 12/17/2013 0844   ALT 23 10/07/2013 0945   BILITOT 1.04 12/17/2013 0844   BILITOT 1.2 10/07/2013 0945       RADIOGRAPHIC STUDIES: Ct Chest W Contrast  12/17/2013   CLINICAL DATA:  Lung cancer diagnosed in 2010 and 2014. Tonsillar cancer diagnosed in 2008. Chemotherapy in progress. Status post right upper lobectomy. Brain and liver metastasis. No current complaints. Substance abuse. 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  EXAM: CT CHEST, ABDOMEN, AND PELVIS WITH CONTRAST  TECHNIQUE: Multidetector CT  imaging of the chest, abdomen and pelvis was performed following the standard protocol during bolus administration of intravenous contrast.  CONTRAST:  161mL OMNIPAQUE IOHEXOL 300 MG/ML  SOLN  COMPARISON:  10/07/2013  FINDINGS:   CT CHEST FINDINGS  Lungs/Pleura: Secretions within the trachea. Surgical changes within the right lung apex.  Spiculated partially cavitary left upper lobe lung mass. 3.2 x 2.3 cm on image 19 versus 2.3 x 2.1 cm on the prior exam (when remeasured). Minimal left lower lobe nodularity. Laterally, this is similar on image 42. A 2 mm more posterior medial nodule on image 43 is felt to be new.  No pleural fluid.  Heart/Mediastinum: No supraclavicular adenopathy. Right-sided Port-A-Cath which terminates at the fifth low SVC. Bovine arch. Normal heart size, without pericardial effusion. Multivessel coronary artery atherosclerosis. No central pulmonary embolism, on this non-dedicated study. No mediastinal adenopathy. Right hilar node is upper normal in size at 12 mm, unchanged.  Retrocrural index node measures 1.3 cm on image 48 versus 6 mm on the prior.    CT  ABDOMEN FINDINGS  Abdomen: Progression of hepatic metastasis. Index high right liver lobe mass measures 6.2 x 6.8 cm on image 54 versus 3.8 x 3.6 cm on the prior.  A pericholecystic lesion measures 5.0 x 4.5 cm versus 3.5 x 3.0 cm on the prior. Enlarging lesion in the lateral segment left lobe.  Splenic mass which is presumably a metastasis measures 4.4 x 3.3 cm today versus 3.4 x 2.5 cm on the prior. An upper normal porta hepatis node measures 1.1 cm, not readily apparent on the prior.  Normal stomach. Descending duodenal diverticulum. Normal pancreas, adrenal glands. Air within the gallbladder, without specific evidence of acute cholecystitis. Stones or sludge are chronic.  Normal kidneys. Separate origins of the common hepatic artery and splenic artery. Aortic and branch vessel atherosclerosis. Small retroperitoneal nodes which are not  pathologic by size criteria and similar. Normal abdominal large and small bowel loops. No abdominal ascites or omental/ peritoneal disease.  A tiny right paracentral ventral abdominal wall hernia.  Bones/Musculoskeletal: Remote left-sided rib fractures.    IMPRESSION: 1. Disease progression within the chest. Enlargement of a partially cavitary left upper lobe lung nodule with developing retrocrural adenopathy. Possible new left lower lobe tiny lung nodule. 2. Disease progression within the abdomen. Progressive hepatic metastasis and splenic metastasis. 3. New air within the gallbladder. Correlate with right upper quadrant symptoms. No specific evidence of acute cholecystitis. No other areas of pneumobilia. This could potentially represent nitrogen gas within gallstones. 4. Advanced atherosclerosis, including within the coronary arteries. 5. Pelvis not imaged. 6. Possible developing porta hepatis nodal metastasis.   Electronically Signed   By: Abigail Miyamoto M.D.   On: 12/17/2013 12:04   ASSESSMENT/PLAN:  This is a very pleasant 53 years old white male with extensive stage small cell lung cancer status post 6 cycles of systemic chemotherapy with carboplatin and etoposide.  Unfortunately his recent CT scan of the chest, abdomen and pelvis showed evidence for disease progression in the lung as well as the liver and abdomen. I discussed the scan results with the patient today. I gave him the option of treatment with second line chemotherapy in the form of cisplatin 70 mg/M2 and irinotecan 65 mg/M2 on days 1 and 8 every 3 weeks versus palliative care and hospice referral. The patient is interested on the systemic chemotherapy. I discussed with the adverse effect of this treatment including but not limited to alopecia, myelosuppression, nausea and vomiting, peripheral neuropathy, liver or renal dysfunction as well as diarrhea and hearing deficit. He would like to proceed with treatment as planned. He is expected to  start the first cycle of this treatment on 12/29/2013. For the history of seizure activity, the patient will continue his current treatment with topotecan he will followup with Dr. Jannifer Franklin as scheduled. He is expected to have EEG next week. The patient would come back for followup visit in 2 weeks for evaluation and management any adverse effect of his treatment. He was advised to call immediately if he has any concerning symptoms in the interval. All questions were answered. The patient knows to call the clinic with any problems, questions or concerns. We can certainly see the patient much sooner if necessary.   I spent 15 minutes counseling the patient face to face. The total time spent in the appointment was 25 minutes.  Disclaimer: This note was dictated with voice recognition software. Similar sounding words can inadvertently be transcribed and may not be corrected upon review.  Curt Bears, MD 12/21/2013

## 2013-12-22 ENCOUNTER — Encounter: Payer: Self-pay | Admitting: Internal Medicine

## 2013-12-22 NOTE — Progress Notes (Signed)
Called to let the patient know he needs to call DSS and I gave him the ph# [phone]. The message left from Ms. Loletha Grayer, gave ph# if he is disabled. He said he gets ssi only.

## 2013-12-23 ENCOUNTER — Telehealth: Payer: Self-pay | Admitting: *Deleted

## 2013-12-23 NOTE — Telephone Encounter (Signed)
Per staff message and POF I have scheduled appts.  JMW  

## 2013-12-24 ENCOUNTER — Telehealth: Payer: Self-pay | Admitting: Internal Medicine

## 2013-12-24 NOTE — Telephone Encounter (Signed)
s.w.l pt and advised on April appt...sed added tx.

## 2013-12-28 ENCOUNTER — Telehealth: Payer: Self-pay | Admitting: *Deleted

## 2013-12-28 ENCOUNTER — Other Ambulatory Visit (INDEPENDENT_AMBULATORY_CARE_PROVIDER_SITE_OTHER): Payer: Self-pay | Admitting: Radiology

## 2013-12-28 ENCOUNTER — Encounter (INDEPENDENT_AMBULATORY_CARE_PROVIDER_SITE_OTHER): Payer: Medicaid Other | Admitting: Neurology

## 2013-12-28 ENCOUNTER — Other Ambulatory Visit: Payer: Self-pay | Admitting: Neurology

## 2013-12-28 DIAGNOSIS — C3490 Malignant neoplasm of unspecified part of unspecified bronchus or lung: Secondary | ICD-10-CM

## 2013-12-28 DIAGNOSIS — Z0289 Encounter for other administrative examinations: Secondary | ICD-10-CM

## 2013-12-28 DIAGNOSIS — G40209 Localization-related (focal) (partial) symptomatic epilepsy and epileptic syndromes with complex partial seizures, not intractable, without status epilepticus: Secondary | ICD-10-CM

## 2013-12-28 DIAGNOSIS — R4182 Altered mental status, unspecified: Secondary | ICD-10-CM

## 2013-12-28 MED ORDER — PROCHLORPERAZINE MALEATE 10 MG PO TABS: 10.0000 mg | ORAL_TABLET | Freq: Four times a day (QID) | ORAL | Status: DC | PRN

## 2013-12-28 MED ORDER — ONDANSETRON HCL 8 MG PO TABS
8.0000 mg | ORAL_TABLET | Freq: Three times a day (TID) | ORAL | Status: DC | PRN
Start: 2013-12-28 — End: 2014-07-14

## 2013-12-28 NOTE — Procedures (Signed)
    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 The patient has had events of being confused, staggery. Witnessed episodes of "staring off" have been noted. The patient be evaluated for possible seizures.  This is a routine EEG. A left occipital craniotomy is noted. Medications include Docusate, Mucinex, Keppra, Claritin, Prilosec, Zofran, and Compazine.   EEG classification: Normal awake  Description of the recording: The background rhythms of this recording consists of a fairly well modulated medium amplitude alpha rhythm of 9 Hz that is reactive to eye opening and closure. As the record progresses, the patient appears to remain in the waking state throughout the recording. At times during the recording, the amplitudes of background rhythm activities are somewhat lower in the left hemisphere than the right. Photic stimulation was performed, resulting in a bilateral and symmetric photic driving response. Hyperventilation was not performed. At no time during the recording does there appear to be evidence of spike or spike wave discharges or evidence of focal slowing. EKG monitor shows no evidence of cardiac rhythm abnormalities with a heart rate of 84.  Impression: This is a normal EEG recording in the waking state. No evidence of ictal or interictal discharges are seen.

## 2013-12-28 NOTE — Telephone Encounter (Signed)
Called and informed pt that we called in zofran and compazine for patient.  Advised that pt not take zofran for 3 days after chemotherapy.  SLJ

## 2013-12-29 ENCOUNTER — Other Ambulatory Visit: Payer: Medicaid Other

## 2013-12-29 ENCOUNTER — Telehealth: Payer: Self-pay | Admitting: Internal Medicine

## 2013-12-29 ENCOUNTER — Ambulatory Visit: Payer: Medicaid Other

## 2013-12-29 NOTE — Telephone Encounter (Signed)
pt r/s appt to Friday Nurse aware

## 2013-12-31 ENCOUNTER — Ambulatory Visit: Payer: Medicaid Other

## 2013-12-31 ENCOUNTER — Other Ambulatory Visit: Payer: Medicaid Other

## 2014-01-03 ENCOUNTER — Telehealth: Payer: Self-pay | Admitting: Medical Oncology

## 2014-01-03 NOTE — Telephone Encounter (Signed)
I left message with pts new appointments

## 2014-01-06 ENCOUNTER — Ambulatory Visit (HOSPITAL_BASED_OUTPATIENT_CLINIC_OR_DEPARTMENT_OTHER): Payer: Medicaid Other

## 2014-01-06 ENCOUNTER — Ambulatory Visit (HOSPITAL_BASED_OUTPATIENT_CLINIC_OR_DEPARTMENT_OTHER): Payer: Medicaid Other | Admitting: Internal Medicine

## 2014-01-06 ENCOUNTER — Telehealth: Payer: Self-pay | Admitting: *Deleted

## 2014-01-06 ENCOUNTER — Telehealth: Payer: Self-pay | Admitting: Internal Medicine

## 2014-01-06 ENCOUNTER — Encounter: Payer: Self-pay | Admitting: Internal Medicine

## 2014-01-06 VITALS — BP 132/85 | HR 84 | Temp 97.7°F | Resp 18

## 2014-01-06 VITALS — BP 154/75 | HR 102 | Temp 98.1°F | Resp 18 | Ht 73.0 in | Wt 160.4 lb

## 2014-01-06 DIAGNOSIS — Z5111 Encounter for antineoplastic chemotherapy: Secondary | ICD-10-CM

## 2014-01-06 DIAGNOSIS — C3490 Malignant neoplasm of unspecified part of unspecified bronchus or lung: Secondary | ICD-10-CM

## 2014-01-06 DIAGNOSIS — C787 Secondary malignant neoplasm of liver and intrahepatic bile duct: Secondary | ICD-10-CM

## 2014-01-06 DIAGNOSIS — C50919 Malignant neoplasm of unspecified site of unspecified female breast: Secondary | ICD-10-CM

## 2014-01-06 DIAGNOSIS — C3410 Malignant neoplasm of upper lobe, unspecified bronchus or lung: Secondary | ICD-10-CM

## 2014-01-06 DIAGNOSIS — I4891 Unspecified atrial fibrillation: Secondary | ICD-10-CM

## 2014-01-06 DIAGNOSIS — C779 Secondary and unspecified malignant neoplasm of lymph node, unspecified: Secondary | ICD-10-CM

## 2014-01-06 DIAGNOSIS — Z95828 Presence of other vascular implants and grafts: Secondary | ICD-10-CM

## 2014-01-06 DIAGNOSIS — E119 Type 2 diabetes mellitus without complications: Secondary | ICD-10-CM

## 2014-01-06 LAB — CBC WITH DIFFERENTIAL/PLATELET
BASO%: 0.5 % (ref 0.0–2.0)
BASOS ABS: 0 10*3/uL (ref 0.0–0.1)
EOS%: 2.1 % (ref 0.0–7.0)
Eosinophils Absolute: 0.1 10*3/uL (ref 0.0–0.5)
HEMATOCRIT: 42.3 % (ref 38.4–49.9)
HEMOGLOBIN: 14.9 g/dL (ref 13.0–17.1)
LYMPH#: 0.9 10*3/uL (ref 0.9–3.3)
LYMPH%: 33.1 % (ref 14.0–49.0)
MCH: 37.6 pg — AB (ref 27.2–33.4)
MCHC: 35.2 g/dL (ref 32.0–36.0)
MCV: 106.6 fL — ABNORMAL HIGH (ref 79.3–98.0)
MONO#: 0.4 10*3/uL (ref 0.1–0.9)
MONO%: 13.8 % (ref 0.0–14.0)
NEUT#: 1.4 10*3/uL — ABNORMAL LOW (ref 1.5–6.5)
NEUT%: 50.5 % (ref 39.0–75.0)
Platelets: 94 10*3/uL — ABNORMAL LOW (ref 140–400)
RBC: 3.97 10*6/uL — ABNORMAL LOW (ref 4.20–5.82)
RDW: 15.8 % — ABNORMAL HIGH (ref 11.0–14.6)
WBC: 2.7 10*3/uL — AB (ref 4.0–10.3)

## 2014-01-06 LAB — COMPREHENSIVE METABOLIC PANEL (CC13)
ALT: 31 U/L (ref 0–55)
ANION GAP: 14 meq/L — AB (ref 3–11)
AST: 97 U/L — ABNORMAL HIGH (ref 5–34)
Albumin: 3.5 g/dL (ref 3.5–5.0)
Alkaline Phosphatase: 134 U/L (ref 40–150)
BUN: 5.9 mg/dL — AB (ref 7.0–26.0)
CALCIUM: 8.5 mg/dL (ref 8.4–10.4)
CHLORIDE: 105 meq/L (ref 98–109)
CO2: 25 mEq/L (ref 22–29)
Creatinine: 0.9 mg/dL (ref 0.7–1.3)
GLUCOSE: 245 mg/dL — AB (ref 70–140)
Potassium: 3.6 mEq/L (ref 3.5–5.1)
Sodium: 145 mEq/L (ref 136–145)
Total Bilirubin: 0.88 mg/dL (ref 0.20–1.20)
Total Protein: 6.9 g/dL (ref 6.4–8.3)

## 2014-01-06 LAB — MAGNESIUM (CC13): MAGNESIUM: 1.2 mg/dL — AB (ref 1.5–2.5)

## 2014-01-06 MED ORDER — DIPHENHYDRAMINE HCL 50 MG/ML IJ SOLN
25.0000 mg | Freq: Once | INTRAMUSCULAR | Status: AC
  Administered 2014-01-06: 25 mg via INTRAVENOUS

## 2014-01-06 MED ORDER — POTASSIUM CHLORIDE 2 MEQ/ML IV SOLN
Freq: Once | INTRAVENOUS | Status: AC
  Administered 2014-01-06: 10:00:00 via INTRAVENOUS
  Filled 2014-01-06: qty 10

## 2014-01-06 MED ORDER — ATROPINE SULFATE 1 MG/ML IJ SOLN
0.5000 mg | Freq: Once | INTRAMUSCULAR | Status: AC | PRN
  Administered 2014-01-06: 0.5 mg via INTRAVENOUS

## 2014-01-06 MED ORDER — DEXAMETHASONE SODIUM PHOSPHATE 20 MG/5ML IJ SOLN
12.0000 mg | Freq: Once | INTRAMUSCULAR | Status: AC
Start: 2014-01-06 — End: 2014-01-06
  Administered 2014-01-06: 12 mg via INTRAVENOUS

## 2014-01-06 MED ORDER — HEPARIN SOD (PORK) LOCK FLUSH 100 UNIT/ML IV SOLN
500.0000 [IU] | Freq: Once | INTRAVENOUS | Status: DC
  Filled 2014-01-06: qty 5

## 2014-01-06 MED ORDER — PALONOSETRON HCL INJECTION 0.25 MG/5ML
0.2500 mg | Freq: Once | INTRAVENOUS | Status: AC
  Administered 2014-01-06: 0.25 mg via INTRAVENOUS

## 2014-01-06 MED ORDER — SODIUM CHLORIDE 0.9 % IV SOLN
Freq: Once | INTRAVENOUS | Status: AC
  Administered 2014-01-06: 10:00:00 via INTRAVENOUS

## 2014-01-06 MED ORDER — HEPARIN SOD (PORK) LOCK FLUSH 100 UNIT/ML IV SOLN
500.0000 [IU] | Freq: Once | INTRAVENOUS | Status: AC | PRN
  Administered 2014-01-06: 500 [IU]
  Filled 2014-01-06: qty 5

## 2014-01-06 MED ORDER — MAGNESIUM OXIDE 400 (241.3 MG) MG PO TABS
400.0000 mg | ORAL_TABLET | Freq: Two times a day (BID) | ORAL | Status: DC
Start: 2014-01-06 — End: 2014-02-14

## 2014-01-06 MED ORDER — SODIUM CHLORIDE 0.9 % IJ SOLN
10.0000 mL | INTRAMUSCULAR | Status: DC | PRN
  Administered 2014-01-06: 10 mL
  Filled 2014-01-06: qty 10

## 2014-01-06 MED ORDER — FAMOTIDINE IN NACL 20-0.9 MG/50ML-% IV SOLN
20.0000 mg | Freq: Once | INTRAVENOUS | Status: AC
  Administered 2014-01-06: 20 mg via INTRAVENOUS

## 2014-01-06 MED ORDER — METHYLPREDNISOLONE SODIUM SUCC 125 MG IJ SOLR
125.0000 mg | Freq: Once | INTRAMUSCULAR | Status: AC
  Administered 2014-01-06: 125 mg via INTRAVENOUS

## 2014-01-06 MED ORDER — SODIUM CHLORIDE 0.9 % IV SOLN
150.0000 mg | Freq: Once | INTRAVENOUS | Status: AC
  Administered 2014-01-06: 150 mg via INTRAVENOUS
  Filled 2014-01-06: qty 5

## 2014-01-06 MED ORDER — PALONOSETRON HCL INJECTION 0.25 MG/5ML
INTRAVENOUS | Status: AC
  Filled 2014-01-06: qty 5

## 2014-01-06 MED ORDER — SODIUM CHLORIDE 0.9 % IV SOLN
30.0000 mg/m2 | Freq: Once | INTRAVENOUS | Status: AC
  Administered 2014-01-06: 57 mg via INTRAVENOUS
  Filled 2014-01-06: qty 57

## 2014-01-06 MED ORDER — SODIUM CHLORIDE 0.9 % IJ SOLN
10.0000 mL | INTRAMUSCULAR | Status: DC | PRN
  Administered 2014-01-06: 10 mL via INTRAVENOUS
  Filled 2014-01-06: qty 10

## 2014-01-06 MED ORDER — DEXAMETHASONE SODIUM PHOSPHATE 20 MG/5ML IJ SOLN
INTRAMUSCULAR | Status: AC
  Filled 2014-01-06: qty 5

## 2014-01-06 MED ORDER — IRINOTECAN HCL CHEMO INJECTION 100 MG/5ML
65.0000 mg/m2 | Freq: Once | INTRAVENOUS | Status: AC
  Administered 2014-01-06: 124 mg via INTRAVENOUS
  Filled 2014-01-06: qty 6.2

## 2014-01-06 MED ORDER — ATROPINE SULFATE 1 MG/ML IJ SOLN
INTRAMUSCULAR | Status: AC
  Filled 2014-01-06: qty 1

## 2014-01-06 NOTE — Telephone Encounter (Signed)
Gave pt appt for lab,md and chemo for May 2015

## 2014-01-06 NOTE — Progress Notes (Signed)
[facility]  Telephone:([phone] Fax:(336) [phone]  PROGRESS NOTE  [name], DO [place] [address]  DIAGNOSIS: Extensive Stage small cell lung cancer diagnosed in September of 2014.  PRIOR THERAPY: Systemic chemotherapy with carboplatin for an AUC of 5 given on day 1 and etoposide 120 mg per meter squared given on days 1, 2 and 3 with Neulasta support given on day 4 status post 2 cycles. Carboplatin was reduced to AUC of 4 and etoposide 100 mg/M2 starting from cycle #6, last dose was 09/20/2013.  CURRENT THERAPY: Systemic chemotherapy with cisplatin 50 mg/M2 and irinotecan 65 mg/M2 on days 1 and 8 every 3 weeks first dose today 01/06/2014.  DISEASE STAGE: Extensive stage  CHEMOTHERAPY INTENT: Palliative  CURRENT # OF CHEMOTHERAPY CYCLES: 1  CURRENT ANTIEMETICS: Zofran, dexamethasone, Compazine  CURRENT SMOKING STATUS: Current smoker, strongly advised to quit smoking  ORAL CHEMOTHERAPY AND CONSENT: N./A.  CURRENT BISPHOSPHONATES USE: None  PAIN MANAGEMENT: Percocet  NARCOTICS INDUCED CONSTIPATION: None  LIVING WILL AND CODE STATUS: ?   INTERVAL HISTORY: William Townsend 53 y.o. male returns for a follow up visit. He was recently found to have evidence for disease progression and the patient is here today to start the first cycle of second line chemotherapy with cisplatin and irinotecan. He missed his appointment last week because he was not feeling good.  He was seen recently by Dr. Jannifer Franklin and [facility] neurology and currently on treatment with Keppra 250 mg by mouth twice a day.  He denied having any significant chest pain, shortness of breath, cough or hemoptysis. The patient has no nausea or vomiting. He has no fever or chills. He has no significant weight loss or night sweats. He continues to have occasional unbalanced gait.  MEDICAL HISTORY: Past Medical History  Diagnosis Date  . Atrial fibrillation   . Diabetes  mellitus   . 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 Allergy   . Substance abuse   . Depression   . Stroke     3 previous strokes - Oct, Nov, and Dec 2012  . Throat cancer     September 2008  . Lung cancer     lobectomy in 2010  . Heart murmur   . Hx of radiation therapy 05/18/13- 06/01/13    whole brain, 30 Gy, 10 fractions    ALLERGIES:  is allergic to vicodin.  MEDICATIONS:  Current Outpatient Prescriptions  Medication Sig Dispense Refill  . docusate sodium (COLACE) 100 MG capsule Take 100 mg by mouth 2 (two) times daily.      Marland Kitchen guaiFENesin (MUCINEX) 600 MG 12 hr tablet Take by mouth 2 (two) times daily.      Marland Kitchen levETIRAcetam (KEPPRA) 500 MG tablet One half tablet twice a day for 2 weeks, then take one tablet twice a day  60 tablet  5  . loratadine (CLARITIN) 10 MG tablet Take 1 tablet (10 mg total) by mouth daily as needed for allergies.  30 tablet  0  . omeprazole (PRILOSEC) 20 MG capsule Take 20 mg by mouth daily.      . ondansetron (ZOFRAN) 8 MG tablet Take 1 tablet (8 mg total) by mouth every 8 (eight) hours as needed for nausea or vomiting (start on the 3rd day after chemotherapy.).  30 tablet  1  . prochlorperazine (COMPAZINE) 10 MG tablet Take 1 tablet (10 mg total) by mouth every 6 (six) hours as needed for nausea or vomiting.  [place]  tablet  1   No current facility-administered medications for this visit.   Facility-Administered Medications Ordered in Other Visits  Medication Dose Route Frequency Provider Last Rate Last Dose  . heparin lock flush 100 unit/mL  500 Units Intravenous Once Curt Bears, MD      . sodium chloride 0.9 % injection 10 mL  10 mL Intravenous PRN Curt Bears, MD   10 mL at 01/06/14 0830    SURGICAL HISTORY:  Past Surgical History  Procedure Laterality Date  . Lobectomy    . Craniotomy Left 04/27/2013    Procedure: Left Occipital Craniotomy for resection of tumor with microscope;  Surgeon: Floyce Stakes, MD;  Location: [facility];  Service:  Neurosurgery;  Laterality: Left;    REVIEW OF SYSTEMS:  Constitutional: positive for fatigue Eyes: negative Ears, nose, mouth, throat, and face: negative Respiratory: negative Cardiovascular: negative Gastrointestinal: negative Genitourinary:negative Integument/breast: negative Hematologic/lymphatic: negative Musculoskeletal:negative Neurological: negative Behavioral/Psych: negative Endocrine: negative Allergic/Immunologic: negative   PHYSICAL EXAMINATION: General appearance: alert, cooperative and no distress Head: Normocephalic, without obvious abnormality, atraumatic Neck: no adenopathy, no carotid bruit, no JVD, supple, symmetrical, trachea midline and thyroid not enlarged, symmetric, no tenderness/mass/nodules Lymph nodes: Cervical, supraclavicular, and axillary nodes normal. Resp: clear to auscultation bilaterally Cardio: regular rate and rhythm, S1, S2 normal, no murmur, click, rub or gallop GI: soft, non-tender; bowel sounds normal; no masses,  no organomegaly Extremities: extremities normal, atraumatic, no cyanosis or edema Neurologic: Alert and oriented X 3, normal strength and tone. Normal symmetric reflexes. Normal coordination and gait  ECOG PERFORMANCE STATUS: 1 - Symptomatic but completely ambulatory  Blood pressure 154/75, pulse 102, temperature 98.1 F (36.7 C), temperature source Oral, resp. rate 18, height 6\' 1"  (1.854 m), weight 160 lb 6.4 oz (72.757 kg), SpO2 100.00%.  LABORATORY DATA: Lab Results  Component Value Date   WBC 2.7* 01/06/2014   HGB 14.9 01/06/2014   HCT 42.3 01/06/2014   MCV 106.6* 01/06/2014   PLT 94* 01/06/2014      Chemistry      Component Value Date/Time   NA 145 01/06/2014 0819   NA 136* 10/08/2013 0425   K 3.6 01/06/2014 0819   K 3.8 10/08/2013 0425   CL 94* 10/08/2013 0425   CO2 25 01/06/2014 0819   CO2 26 10/08/2013 0425   BUN 5.9* 01/06/2014 0819   BUN 11 10/08/2013 0425   CREATININE 0.9 01/06/2014 0819   CREATININE 0.64 10/08/2013  0425   CREATININE 1.04 01/28/2012 1454      Component Value Date/Time   CALCIUM 8.5 01/06/2014 0819   CALCIUM 7.1* 10/08/2013 0425   ALKPHOS 134 01/06/2014 0819   ALKPHOS 159* 10/07/2013 0945   AST 97* 01/06/2014 0819   AST 29 10/07/2013 0945   ALT 31 01/06/2014 0819   ALT 23 10/07/2013 0945   BILITOT 0.88 01/06/2014 0819   BILITOT 1.2 10/07/2013 0945       RADIOGRAPHIC STUDIES:  ASSESSMENT/PLAN:  This is a very pleasant 53 years old white male with extensive stage small cell lung cancer status post 6 cycles of systemic chemotherapy with carboplatin and etoposide.  Unfortunately his recent CT scan of the chest, abdomen and pelvis showed evidence for disease progression in the lung as well as the liver and abdomen. He is here today to start the first cycle of his systemic chemotherapy with cisplatin and irinotecan. I recommended for the patient to proceed with the first cycle as scheduled. He will come back for followup visit in  3 weeks with the start of cycle #2 He is expected to start the first cycle of this treatment on 12/29/2013. For the history of seizure activity, the patient will continue his current treatment with Keppra. He was advised to call immediately if he has any concerning symptoms in the interval. All questions were answered. The patient knows to call the clinic with any problems, questions or concerns. We can certainly see the patient much sooner if necessary.   Disclaimer: This note was dictated with voice recognition software. Similar sounding words can inadvertently be transcribed and may not be corrected upon review.  Curt Bears, MD 01/06/2014

## 2014-01-06 NOTE — Progress Notes (Signed)
Per Dr Julien Nordmann it is okay to treat pt today with chemotherpy and todays LABS.

## 2014-01-06 NOTE — Patient Instructions (Signed)
[facility] Discharge Instructions for Patients Receiving Chemotherapy  Today you received the following chemotherapy agents Camptosar and Cisplatin.  To help prevent nausea and vomiting after your treatment, we encourage you to take your nausea medication.   If you develop nausea and vomiting that is not controlled by your nausea medication, call the clinic.   BELOW ARE SYMPTOMS THAT SHOULD BE REPORTED IMMEDIATELY:  *FEVER GREATER THAN 100.5 F  *CHILLS WITH OR WITHOUT FEVER  NAUSEA AND VOMITING THAT IS NOT CONTROLLED WITH YOUR NAUSEA MEDICATION  *UNUSUAL SHORTNESS OF BREATH  *UNUSUAL BRUISING OR BLEEDING  TENDERNESS IN MOUTH AND THROAT WITH OR WITHOUT PRESENCE OF ULCERS  *URINARY PROBLEMS  *BOWEL PROBLEMS  UNUSUAL RASH Items with * indicate a potential emergency and should be followed up as soon as possible.  Feel free to call the clinic you have any questions or concerns. The clinic phone number is [phone].

## 2014-01-06 NOTE — Patient Instructions (Signed)
Smoking Cessation, Tips for Success If you are ready to quit smoking, congratulations! You have chosen to help yourself be healthier. Cigarettes bring nicotine, tar, carbon monoxide, and other irritants into your body. Your lungs, heart, and blood vessels will be able to work better without these poisons. There are many different ways to quit smoking. Nicotine gum, nicotine patches, a nicotine inhaler, or nicotine nasal spray can help with physical craving. Hypnosis, support groups, and medicines help break the habit of smoking. WHAT THINGS CAN I DO TO MAKE QUITTING EASIER?  Here are some tips to help you quit for good:  Pick a date when you will quit smoking completely. Tell all of your friends and family about your plan to quit on that date.  Do not try to slowly cut down on the number of cigarettes you are smoking. Pick a quit date and quit smoking completely starting on that day.  Throw away all cigarettes.   Clean and remove all ashtrays from your home, work, and car.   On a card, write down your reasons for quitting. Carry the card with you and read it when you get the urge to smoke.   Cleanse your body of nicotine. Drink enough water and fluids to keep your urine clear or pale yellow. Do this after quitting to flush the nicotine from your body.   Learn to predict your moods. Do not let a bad situation be your excuse to have a cigarette. Some situations in your life might tempt you into wanting a cigarette.   Never have "just one" cigarette. It leads to wanting another and another. Remind yourself of your decision to quit.   Change habits associated with smoking. If you smoked while driving or when feeling stressed, try other activities to replace smoking. Stand up when drinking your coffee. Brush your teeth after eating. Sit in a different chair when you read the paper. Avoid alcohol while trying to quit, and try to drink fewer caffeinated beverages. Alcohol and caffeine may urge  you to smoke.   Avoid foods and drinks that can trigger a desire to smoke, such as sugary or spicy foods and alcohol.   Ask people who smoke not to smoke around you.   Have something planned to do right after eating or having a cup of coffee. For example, plan to take a walk or exercise.   Try a relaxation exercise to calm you down and decrease your stress. Remember, you may be tense and nervous for the first 2 weeks after you quit, but this will pass.   Find new activities to keep your hands busy. Play with a pen, coin, or rubber band. Doodle or draw things on paper.   Brush your teeth right after eating. This will help cut down on the craving for the taste of tobacco after meals. You can also try mouthwash.   Use oral substitutes in place of cigarettes. Try using lemon drops, carrots, cinnamon sticks, or chewing gum. Keep them handy so they are available when you have the urge to smoke.   When you have the urge to smoke, try deep breathing.   Designate your home as a nonsmoking area.   If you are a heavy smoker, ask your health care provider about a prescription for nicotine chewing gum. It can ease your withdrawal from nicotine.   Reward yourself. Set aside the cigarette money you save and buy yourself something nice.   Look for support from others. Join a support group or   smoking cessation program. Ask someone at home or at work to help you with your plan to quit smoking.   Always ask yourself, "Do I need this cigarette or is this just a reflex?" Tell yourself, "Today, I choose not to smoke," or "I do not want to smoke." You are reminding yourself of your decision to quit.  Do not replace cigarette smoking with electronic cigarettes (commonly called e-cigarettes). The safety of e-cigarettes is unknown, and some may contain harmful chemicals.  If you relapse, do not give up! Plan ahead and think about what you will do the next time you get the urge to smoke.  HOW WILL  I FEEL WHEN I QUIT SMOKING? You may have symptoms of withdrawal because your body is used to nicotine (the addictive substance in cigarettes). You may crave cigarettes, be irritable, feel very hungry, cough often, get headaches, or have difficulty concentrating. The withdrawal symptoms are only temporary. They are strongest when you first quit but will go away within 10 14 days. When withdrawal symptoms occur, stay in control. Think about your reasons for quitting. Remind yourself that these are signs that your body is healing and getting used to being without cigarettes. Remember that withdrawal symptoms are easier to treat than the major diseases that smoking can cause.  Even after the withdrawal is over, expect periodic urges to smoke. However, these cravings are generally short lived and will go away whether you smoke or not. Do not smoke!  WHAT RESOURCES ARE AVAILABLE TO HELP ME QUIT SMOKING? Your health care provider can direct you to community resources or hospitals for support, which may include:  Group support.  Education.  Hypnosis.  Therapy. Document Released: 05/24/2004 Document Revised: 06/16/2013 Document Reviewed: 02/11/2013 [employer] Patient Information 2014 [employer].  

## 2014-01-06 NOTE — Progress Notes (Signed)
1635- Patient returned from the bathroom; noted to be red in the face and bilateral hands trembling. Cisplatin stopped and Dr Julien Nordmann notified. Patient alert and oriented and complains of some shortness of breath and wheezing noted. Benadryl 25mg  and Solumedrol 125mg  given. Patient to be observed for 30 minutes per Dr Julien Nordmann and orders received to discontinue the Cisplatin for today.

## 2014-01-06 NOTE — Progress Notes (Signed)
Per Dr Vista Mink, Mag 1.2, pt needs 400mg  BID Mag Oxide.  Informed pt, he verbalized understanding.  SLJ

## 2014-01-06 NOTE — Patient Instructions (Signed)

## 2014-01-06 NOTE — Telephone Encounter (Signed)
Per staff message and POF I have scheduled appts.  JMW  

## 2014-01-07 ENCOUNTER — Telehealth: Payer: Self-pay | Admitting: *Deleted

## 2014-01-07 NOTE — Telephone Encounter (Signed)
Called Jobie Quaker for chemotherapy F/U.  Patient is doing well.  Experienced adverse reaction with CDDP.  Denies any redness, rash or problems at home.  Denies n/v.  Denies any new side effects or symptoms.  Bowel and bladder is functioning well.  Has not eaten yet today but eats and drinks well.  Instructed to drink 64 oz minimum daily or at least the day before, of and after treatment and try to eat small meals.  Denies questions at this time and encouraged to call if needed.  Reviewed how to call after hours in the case of an emergency.  Expressed desire to receive treatment on 01-13-2014.

## 2014-01-07 NOTE — Telephone Encounter (Signed)
Message copied by Cherylynn Ridges on Fri Jan 07, 2014 12:59 PM ------      Message from: Jaci Carrel A      Created: Thu Jan 06, 2014  6:04 PM      Regarding: chemo follow up call       First time Cisplatin and Camptosar. Dr Julien Nordmann. Rash during Cisplatin. Cisplatin stopped.  ------

## 2014-01-10 ENCOUNTER — Encounter (HOSPITAL_COMMUNITY): Payer: Self-pay | Admitting: Emergency Medicine

## 2014-01-10 ENCOUNTER — Inpatient Hospital Stay (HOSPITAL_COMMUNITY)
Admission: EM | Admit: 2014-01-10 | Discharge: 2014-01-13 | DRG: 054 | Disposition: A | Discharge status: Home Health | Payer: Medicaid Other | Attending: Family Medicine | Admitting: Family Medicine

## 2014-01-10 DIAGNOSIS — C799 Secondary malignant neoplasm of unspecified site: Secondary | ICD-10-CM

## 2014-01-10 DIAGNOSIS — R739 Hyperglycemia, unspecified: Secondary | ICD-10-CM

## 2014-01-10 DIAGNOSIS — J309 Allergic rhinitis, unspecified: Secondary | ICD-10-CM

## 2014-01-10 DIAGNOSIS — E119 Type 2 diabetes mellitus without complications: Secondary | ICD-10-CM | POA: Diagnosis present

## 2014-01-10 DIAGNOSIS — D6959 Other secondary thrombocytopenia: Secondary | ICD-10-CM | POA: Diagnosis present

## 2014-01-10 DIAGNOSIS — R911 Solitary pulmonary nodule: Secondary | ICD-10-CM

## 2014-01-10 DIAGNOSIS — IMO0002 Reserved for concepts with insufficient information to code with codable children: Secondary | ICD-10-CM

## 2014-01-10 DIAGNOSIS — C7931 Secondary malignant neoplasm of brain: Principal | ICD-10-CM | POA: Diagnosis present

## 2014-01-10 DIAGNOSIS — Z8673 Personal history of transient ischemic attack (TIA), and cerebral infarction without residual deficits: Secondary | ICD-10-CM

## 2014-01-10 DIAGNOSIS — C7889 Secondary malignant neoplasm of other digestive organs: Secondary | ICD-10-CM | POA: Diagnosis present

## 2014-01-10 DIAGNOSIS — Z85810 Personal history of malignant neoplasm of tongue: Secondary | ICD-10-CM

## 2014-01-10 DIAGNOSIS — R51 Headache: Secondary | ICD-10-CM

## 2014-01-10 DIAGNOSIS — E41 Nutritional marasmus: Secondary | ICD-10-CM | POA: Diagnosis present

## 2014-01-10 DIAGNOSIS — F1011 Alcohol abuse, in remission: Secondary | ICD-10-CM

## 2014-01-10 DIAGNOSIS — R531 Weakness: Secondary | ICD-10-CM | POA: Diagnosis present

## 2014-01-10 DIAGNOSIS — K769 Liver disease, unspecified: Secondary | ICD-10-CM

## 2014-01-10 DIAGNOSIS — R270 Ataxia, unspecified: Secondary | ICD-10-CM

## 2014-01-10 DIAGNOSIS — Z515 Encounter for palliative care: Secondary | ICD-10-CM

## 2014-01-10 DIAGNOSIS — D6481 Anemia due to antineoplastic chemotherapy: Secondary | ICD-10-CM | POA: Diagnosis present

## 2014-01-10 DIAGNOSIS — E43 Unspecified severe protein-calorie malnutrition: Secondary | ICD-10-CM

## 2014-01-10 DIAGNOSIS — Z79899 Other long term (current) drug therapy: Secondary | ICD-10-CM

## 2014-01-10 DIAGNOSIS — F1010 Alcohol abuse, uncomplicated: Secondary | ICD-10-CM | POA: Diagnosis present

## 2014-01-10 DIAGNOSIS — K709 Alcoholic liver disease, unspecified: Secondary | ICD-10-CM | POA: Diagnosis present

## 2014-01-10 DIAGNOSIS — T451X5A Adverse effect of antineoplastic and immunosuppressive drugs, initial encounter: Secondary | ICD-10-CM | POA: Diagnosis present

## 2014-01-10 DIAGNOSIS — Z720 Tobacco use: Secondary | ICD-10-CM

## 2014-01-10 DIAGNOSIS — C787 Secondary malignant neoplasm of liver and intrahepatic bile duct: Secondary | ICD-10-CM | POA: Diagnosis present

## 2014-01-10 DIAGNOSIS — Z923 Personal history of irradiation: Secondary | ICD-10-CM

## 2014-01-10 DIAGNOSIS — C7949 Secondary malignant neoplasm of other parts of nervous system: Principal | ICD-10-CM

## 2014-01-10 DIAGNOSIS — G8929 Other chronic pain: Secondary | ICD-10-CM

## 2014-01-10 DIAGNOSIS — F17200 Nicotine dependence, unspecified, uncomplicated: Secondary | ICD-10-CM | POA: Diagnosis present

## 2014-01-10 DIAGNOSIS — R279 Unspecified lack of coordination: Secondary | ICD-10-CM | POA: Diagnosis present

## 2014-01-10 DIAGNOSIS — M2550 Pain in unspecified joint: Secondary | ICD-10-CM

## 2014-01-10 DIAGNOSIS — Z85118 Personal history of other malignant neoplasm of bronchus and lung: Secondary | ICD-10-CM

## 2014-01-10 DIAGNOSIS — Z85819 Personal history of malignant neoplasm of unspecified site of lip, oral cavity, and pharynx: Secondary | ICD-10-CM

## 2014-01-10 DIAGNOSIS — G9389 Other specified disorders of brain: Secondary | ICD-10-CM

## 2014-01-10 DIAGNOSIS — C3490 Malignant neoplasm of unspecified part of unspecified bronchus or lung: Secondary | ICD-10-CM | POA: Diagnosis present

## 2014-01-10 DIAGNOSIS — I4891 Unspecified atrial fibrillation: Secondary | ICD-10-CM | POA: Diagnosis present

## 2014-01-10 DIAGNOSIS — Z9221 Personal history of antineoplastic chemotherapy: Secondary | ICD-10-CM

## 2014-01-10 DIAGNOSIS — I10 Essential (primary) hypertension: Secondary | ICD-10-CM | POA: Diagnosis present

## 2014-01-10 LAB — COMPREHENSIVE METABOLIC PANEL
ALT: 31 U/L (ref 0–53)
AST: 59 U/L — AB (ref 0–37)
Albumin: 3.8 g/dL (ref 3.5–5.2)
Alkaline Phosphatase: 115 U/L (ref 39–117)
BUN: 14 mg/dL (ref 6–23)
CALCIUM: 9 mg/dL (ref 8.4–10.5)
CO2: 28 meq/L (ref 19–32)
CREATININE: 0.8 mg/dL (ref 0.50–1.35)
Chloride: 91 mEq/L — ABNORMAL LOW (ref 96–112)
GFR calc Af Amer: >90 mL/min (ref >90)
Glucose, Bld: 318 mg/dL — ABNORMAL HIGH (ref 70–99)
Potassium: 3.5 mEq/L — ABNORMAL LOW (ref 3.7–5.3)
SODIUM: 138 meq/L (ref 137–147)
Total Bilirubin: 1.2 mg/dL (ref 0.3–1.2)
Total Protein: 7.6 g/dL (ref 6.0–8.3)

## 2014-01-10 LAB — URINALYSIS, ROUTINE W REFLEX MICROSCOPIC
BILIRUBIN URINE: NEGATIVE
HGB URINE DIPSTICK: NEGATIVE
Ketones, ur: 15 mg/dL — AB
Leukocytes, UA: NEGATIVE
Nitrite: NEGATIVE
Protein, ur: 30 mg/dL — AB
SPECIFIC GRAVITY, URINE: 1.03 (ref 1.005–1.030)
UROBILINOGEN UA: 1 mg/dL (ref 0.0–1.0)
pH: 6.5 (ref 5.0–8.0)

## 2014-01-10 LAB — CBC WITH DIFFERENTIAL/PLATELET
BASOS ABS: 0 10*3/uL (ref 0.0–0.1)
BASOS PCT: 0 % (ref 0–1)
EOS PCT: 1 % (ref 0–5)
Eosinophils Absolute: 0 10*3/uL (ref 0.0–0.7)
HCT: 32.2 % — ABNORMAL LOW (ref 39.0–52.0)
Hemoglobin: 11.9 g/dL — ABNORMAL LOW (ref 13.0–17.0)
LYMPHS PCT: 28 % (ref 12–46)
Lymphs Abs: 0.6 10*3/uL — ABNORMAL LOW (ref 0.7–4.0)
MCH: 37.2 pg — ABNORMAL HIGH (ref 26.0–34.0)
MCHC: 37 g/dL — AB (ref 30.0–36.0)
MCV: 100.6 fL — ABNORMAL HIGH (ref 78.0–100.0)
Monocytes Absolute: 0.1 10*3/uL (ref 0.1–1.0)
Monocytes Relative: 5 % (ref 3–12)
Neutro Abs: 1.4 10*3/uL — ABNORMAL LOW (ref 1.7–7.7)
Neutrophils Relative %: 67 % (ref 43–77)
PLATELETS: 54 10*3/uL — AB (ref 150–400)
RBC: 3.2 MIL/uL — ABNORMAL LOW (ref 4.22–5.81)
RDW: 14 % (ref 11.5–15.5)
WBC: 2 10*3/uL — ABNORMAL LOW (ref 4.0–10.5)

## 2014-01-10 LAB — URINE MICROSCOPIC-ADD ON

## 2014-01-10 MED ORDER — SODIUM CHLORIDE 0.9 % IV BOLUS (SEPSIS)
1000.0000 mL | Freq: Once | INTRAVENOUS | Status: AC
  Administered 2014-01-11: 1000 mL via INTRAVENOUS

## 2014-01-10 MED ORDER — INSULIN ASPART 100 UNIT/ML ~~LOC~~ SOLN
10.0000 [IU] | Freq: Once | SUBCUTANEOUS | Status: AC
  Administered 2014-01-11: 10 [IU] via SUBCUTANEOUS
  Filled 2014-01-10: qty 1

## 2014-01-10 NOTE — ED Provider Notes (Signed)
CSN: [id number]     Arrival date & time 01/10/14  1919 History   First MD Initiated Contact with Patient 01/10/14 2350     Chief Complaint  Patient presents with  . 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 He has had difficulty with strength and coordination for several months. This has acutely worsened over the last week. It is severe enough that he finds it nearly impossible to stand from a sitting position. When he does ambulate he is very off balance. When he stands to urinate he has to hold on to the wall. This is exacerbated significantly when he closes his eyes. He had chemotherapy with cisplatin 4 days ago. He had some type of allergic reaction during the infusion which had never happened before. He denies nausea or vomiting. He had loose stools yesterday although he normally has constipation. His vision has been blurred and his hearing has been decreased since his cisplatin infusion and he is having worsening ringing in his ears.   Past Medical History  Diagnosis Date  . Atrial fibrillation   . Diabetes mellitus   . 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 Allergy   . Substance abuse   . Depression   . Stroke     3 previous strokes - Oct, Nov, and Dec 2012  . Throat cancer     September 2008  . Lung cancer     lobectomy in 2010  . Heart murmur   . Hx of radiation therapy 05/18/13- 06/01/13    whole brain, 30 Gy, 10 fractions   Past Surgical History  Procedure Laterality Date  . Lobectomy    . Craniotomy Left 04/27/2013    Procedure: Left Occipital Craniotomy for resection of tumor with microscope;  Surgeon: Floyce Stakes, MD;  Location: [facility];  Service: Neurosurgery;  Laterality: Left;   Family History  Problem Relation Age of Onset  . Diabetes Mother   . Early death Mother   . Heart disease Father   . Stroke  Father   . Asthma Sister   . Depression Sister   . Seizures Neg Hx    History  Substance Use Topics  . Smoking status: Current Every Day Smoker -- 0.50 packs/day for 30 years    Types: Cigarettes  . Smokeless tobacco: Never Used  . Alcohol Use: 0.0 oz/week    1-2 Cans of beer per week     Comment: couple beers at night    Review of Systems  All other systems reviewed and are negative.  Allergies  Vicodin  Home Medications   Prior to Admission medications   Medication Sig Start Date End Date Taking? Authorizing Provider  docusate sodium (COLACE) 100 MG capsule Take 100 mg by mouth 2 (two) times daily.   Yes Historical Provider, MD  guaiFENesin (MUCINEX) 600 MG 12 hr tablet Take by mouth 2 (two) times daily.   Yes Historical Provider, MD  levETIRAcetam (KEPPRA) 500 MG tablet Take 500 mg by mouth 2 (two) times daily. One half tablet twice a day for 2 weeks, then take one tablet twice a day 12/02/13  Yes Kathrynn Ducking, MD  loratadine (CLARITIN) 10 MG tablet Take 1 tablet (10 mg total) by mouth daily as needed for allergies. 09/01/13  Yes Curt Bears, MD  magnesium oxide (MAG-OX) 400 (241.3 MG) MG tablet Take 1 tablet (400 mg  total) by mouth 2 (two) times daily. 01/06/14  Yes Curt Bears, MD  omeprazole (PRILOSEC) 20 MG capsule Take 20 mg by mouth daily.   Yes Historical Provider, MD  ondansetron (ZOFRAN) 8 MG tablet Take 1 tablet (8 mg total) by mouth every 8 (eight) hours as needed for nausea or vomiting (start on the 3rd day after chemotherapy.). 12/28/13  Yes Curt Bears, MD   BP 147/98  Pulse 95  Temp(Src) 98.1 F (36.7 C) (Oral)  Resp 18  SpO2 98%  Physical Exam General: Well-developed, well-nourished male in no acute distress; appearance consistent with age of record HENT: normocephalic; atraumatic Eyes: pupils equal, round and reactive to light; extraocular muscles intact Neck: supple Heart: regular rate and rhythm Lungs: clear to auscultation  bilaterally Abdomen: soft; nondistended; nontender; no masses or hepatosplenomegaly; bowel sounds present Extremities: No deformity; full range of motion; pulses normal Neurologic: Awake, alert and oriented; motor function intact in all extremities and symmetric; no facial droop; no pronator drift; normal finger to nose; subjective vertigo when closing his eyes Skin: Warm and dry Psychiatric: Normal mood and affect    ED Course  Procedures (including critical care time)  MDM   Nursing notes and vitals signs, including pulse oximetry, reviewed.  Summary of this visit's results, reviewed by myself:  Labs:  Results for orders placed during the hospital encounter of 01/10/14 (from the past 24 hour(s))  CBC WITH DIFFERENTIAL     Status: Abnormal   Collection Time    01/10/14  9:00 PM      Result Value Ref Range   WBC 2.0 (*) 4.0 - 10.5 K/uL   RBC 3.20 (*) 4.22 - 5.81 MIL/uL   Hemoglobin 11.9 (*) 13.0 - 17.0 g/dL   HCT 32.2 (*) 39.0 - 52.0 %   MCV 100.6 (*) 78.0 - 100.0 fL   MCH 37.2 (*) 26.0 - 34.0 pg   MCHC 37.0 (*) 30.0 - 36.0 g/dL   RDW 14.0  11.5 - 15.5 %   Platelets 54 (*) 150 - 400 K/uL   Neutrophils Relative % 67  43 - 77 %   Neutro Abs 1.4 (*) 1.7 - 7.7 K/uL   Lymphocytes Relative 28  12 - 46 %   Lymphs Abs 0.6 (*) 0.7 - 4.0 K/uL   Monocytes Relative 5  3 - 12 %   Monocytes Absolute 0.1  0.1 - 1.0 K/uL   Eosinophils Relative 1  0 - 5 %   Eosinophils Absolute 0.0  0.0 - 0.7 K/uL   Basophils Relative 0  0 - 1 %   Basophils Absolute 0.0  0.0 - 0.1 K/uL  COMPREHENSIVE METABOLIC PANEL     Status: Abnormal   Collection Time    01/10/14  9:00 PM      Result Value Ref Range   Sodium 138  137 - 147 mEq/L   Potassium 3.5 (*) 3.7 - 5.3 mEq/L   Chloride 91 (*) 96 - 112 mEq/L   CO2 28  19 - 32 mEq/L   Glucose, Bld 318 (*) 70 - 99 mg/dL   BUN 14  6 - 23 mg/dL   Creatinine, Ser 0.80  0.50 - 1.35 mg/dL   Calcium 9.0  8.4 - 10.5 mg/dL   Total Protein 7.6  6.0 - 8.3 g/dL    Albumin 3.8  3.5 - 5.2 g/dL   AST 59 (*) 0 - 37 U/L   ALT 31  0 - 53 U/L   Alkaline Phosphatase 115  39 - 117 U/L   Total Bilirubin 1.2  0.3 - 1.2 mg/dL   GFR calc non Af Amer >90  >90 mL/min   GFR calc Af Amer >90  >90 mL/min  MAGNESIUM     Status: None   Collection Time    01/10/14  9:00 PM      Result Value Ref Range   Magnesium 1.6  1.5 - 2.5 mg/dL  URINALYSIS, ROUTINE W REFLEX MICROSCOPIC     Status: Abnormal   Collection Time    01/10/14  9:09 PM      Result Value Ref Range   Color, Urine YELLOW  YELLOW   APPearance CLEAR  CLEAR   Specific Gravity, Urine 1.030  1.005 - 1.030   pH 6.5  5.0 - 8.0   Glucose, UA >1000 (*) NEGATIVE mg/dL   Hgb urine dipstick NEGATIVE  NEGATIVE   Bilirubin Urine NEGATIVE  NEGATIVE   Ketones, ur 15 (*) NEGATIVE mg/dL   Protein, ur 30 (*) NEGATIVE mg/dL   Urobilinogen, UA 1.0  0.0 - 1.0 mg/dL   Nitrite NEGATIVE  NEGATIVE   Leukocytes, UA NEGATIVE  NEGATIVE  URINE MICROSCOPIC-ADD ON     Status: None   Collection Time    01/10/14  9:09 PM      Result Value Ref Range   Squamous Epithelial / LPF RARE  RARE   WBC, UA 3-6  <3 WBC/hpf   Bacteria, UA RARE  RARE  CBG MONITORING, ED     Status: Abnormal   Collection Time    01/11/14  1:03 AM      Result Value Ref Range   Glucose-Capillary 239 (*) 70 - 99 mg/dL  CBG MONITORING, ED     Status: Abnormal   Collection Time    01/11/14  2:07 AM      Result Value Ref Range   Glucose-Capillary 172 (*) 70 - 99 mg/dL    Imaging Studies: Ct Head Wo Contrast  01/11/2014   CLINICAL DATA:  Weakness, stage IV metastatic lung cancer.  EXAM: CT HEAD WITHOUT CONTRAST  TECHNIQUE: Contiguous axial images were obtained from the base of the skull through the vertex without intravenous contrast.  COMPARISON:  MR HEAD WO/W CM dated 10/08/2013; CT HEAD W/O CM dated 10/07/2013  FINDINGS: The ventricles and sulci are normal. Subcentimeter density at the foramen of Monro is similar, most consistent with colloid cyst. Mild  similar prominence of the cerebellar folia. No intraparenchymal hemorrhage, mass effect nor midline shift.  Left occipital parietal craniotomy for resection of known left occipital metastasis with similar appearance of the resection cavity. Surrounding gliosis/ posttreatment changes. No acute large vascular territory infarcts.  No abnormal extra-axial fluid collections. Basal cisterns are patent.  No skull fracture. The included ocular globes and orbital contents are non-suspicious. Trace mastoid effusions are unchanged.  IMPRESSION: No acute intracranial process.  Stable appearance of the head: Status post left parietoccipital craniotomy for resection of occipital lobe metastasis.  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   Electronically Signed   By: Elon Alas   On: 01/11/2014 01:21   2:30 AM Patient advised of CT laboratory findings. He still insists that he is not able to perform activities of daily living at home.     Karen Chafe Loel Betancur, MD 01/11/14 0230

## 2014-01-10 NOTE — ED Notes (Signed)
Per pt sts that he is weak and cant do anything anymore. sts he has stage 4 lung cancer and has been on chemo. sts he is sick of feeling this way. Pt denies SI.

## 2014-01-11 ENCOUNTER — Ambulatory Visit (HOSPITAL_COMMUNITY): Payer: Medicaid Other

## 2014-01-11 ENCOUNTER — Emergency Department (HOSPITAL_COMMUNITY): Payer: Medicaid Other

## 2014-01-11 DIAGNOSIS — C3490 Malignant neoplasm of unspecified part of unspecified bronchus or lung: Secondary | ICD-10-CM

## 2014-01-11 DIAGNOSIS — C801 Malignant (primary) neoplasm, unspecified: Secondary | ICD-10-CM

## 2014-01-11 DIAGNOSIS — J309 Allergic rhinitis, unspecified: Secondary | ICD-10-CM

## 2014-01-11 DIAGNOSIS — F1011 Alcohol abuse, in remission: Secondary | ICD-10-CM

## 2014-01-11 DIAGNOSIS — Z85118 Personal history of other malignant neoplasm of bronchus and lung: Secondary | ICD-10-CM

## 2014-01-11 DIAGNOSIS — F17200 Nicotine dependence, unspecified, uncomplicated: Secondary | ICD-10-CM

## 2014-01-11 DIAGNOSIS — R531 Weakness: Secondary | ICD-10-CM | POA: Diagnosis present

## 2014-01-11 DIAGNOSIS — R7309 Other abnormal glucose: Secondary | ICD-10-CM

## 2014-01-11 DIAGNOSIS — Z8673 Personal history of transient ischemic attack (TIA), and cerebral infarction without residual deficits: Secondary | ICD-10-CM

## 2014-01-11 DIAGNOSIS — I10 Essential (primary) hypertension: Secondary | ICD-10-CM

## 2014-01-11 DIAGNOSIS — J984 Other disorders of lung: Secondary | ICD-10-CM

## 2014-01-11 DIAGNOSIS — R279 Unspecified lack of coordination: Secondary | ICD-10-CM

## 2014-01-11 DIAGNOSIS — R5383 Other fatigue: Secondary | ICD-10-CM

## 2014-01-11 DIAGNOSIS — E43 Unspecified severe protein-calorie malnutrition: Secondary | ICD-10-CM

## 2014-01-11 DIAGNOSIS — G8929 Other chronic pain: Secondary | ICD-10-CM

## 2014-01-11 DIAGNOSIS — R5381 Other malaise: Secondary | ICD-10-CM

## 2014-01-11 DIAGNOSIS — M2550 Pain in unspecified joint: Secondary | ICD-10-CM

## 2014-01-11 DIAGNOSIS — E119 Type 2 diabetes mellitus without complications: Secondary | ICD-10-CM

## 2014-01-11 DIAGNOSIS — G939 Disorder of brain, unspecified: Secondary | ICD-10-CM

## 2014-01-11 DIAGNOSIS — R270 Ataxia, unspecified: Secondary | ICD-10-CM | POA: Diagnosis present

## 2014-01-11 DIAGNOSIS — Z85819 Personal history of malignant neoplasm of unspecified site of lip, oral cavity, and pharynx: Secondary | ICD-10-CM

## 2014-01-11 LAB — BASIC METABOLIC PANEL
BUN: 15 mg/dL (ref 6–23)
CALCIUM: 8.6 mg/dL (ref 8.4–10.5)
CO2: 28 mEq/L (ref 19–32)
CREATININE: 0.63 mg/dL (ref 0.50–1.35)
Chloride: 94 mEq/L — ABNORMAL LOW (ref 96–112)
Glucose, Bld: 250 mg/dL — ABNORMAL HIGH (ref 70–99)
Potassium: 3.9 mEq/L (ref 3.7–5.3)
Sodium: 138 mEq/L (ref 137–147)

## 2014-01-11 LAB — CBC
HCT: 31.5 % — ABNORMAL LOW (ref 39.0–52.0)
Hemoglobin: 11.9 g/dL — ABNORMAL LOW (ref 13.0–17.0)
MCH: 37.5 pg — ABNORMAL HIGH (ref 26.0–34.0)
MCHC: 37.3 g/dL — ABNORMAL HIGH (ref 30.0–36.0)
MCV: 99.4 fL (ref 78.0–100.0)
PLATELETS: 54 10*3/uL — AB (ref 150–400)
RBC: 3.17 MIL/uL — ABNORMAL LOW (ref 4.22–5.81)
RDW: 13.8 % (ref 11.5–15.5)
WBC: 2.6 10*3/uL — AB (ref 4.0–10.5)

## 2014-01-11 LAB — GLUCOSE, CAPILLARY
Glucose-Capillary: 154 mg/dL — ABNORMAL HIGH (ref 70–99)
Glucose-Capillary: 244 mg/dL — ABNORMAL HIGH (ref 70–99)
Glucose-Capillary: 328 mg/dL — ABNORMAL HIGH (ref 70–99)
Glucose-Capillary: 250 mg/dL — ABNORMAL HIGH (ref 70–99)
Glucose-Capillary: 355 mg/dL — ABNORMAL HIGH (ref 70–99)

## 2014-01-11 LAB — HIV ANTIBODY (ROUTINE TESTING W REFLEX): HIV 1&2 Ab, 4th Generation: NONREACTIVE

## 2014-01-11 LAB — MAGNESIUM: MAGNESIUM: 1.6 mg/dL (ref 1.5–2.5)

## 2014-01-11 LAB — VITAMIN B12: Vitamin B-12: 291 pg/mL (ref 211–911)

## 2014-01-11 LAB — CBG MONITORING, ED
GLUCOSE-CAPILLARY: 239 mg/dL — AB (ref 70–99)
Glucose-Capillary: 172 mg/dL — ABNORMAL HIGH (ref 70–99)

## 2014-01-11 LAB — RPR

## 2014-01-11 MED ORDER — VITAMIN B-1 100 MG PO TABS
100.0000 mg | ORAL_TABLET | Freq: Every day | ORAL | Status: DC
Start: 2014-01-11 — End: 2014-01-13
  Administered 2014-01-11 – 2014-01-13 (×3): 100 mg via ORAL
  Filled 2014-01-11 (×3): qty 1

## 2014-01-11 MED ORDER — LORATADINE 10 MG PO TABS
10.0000 mg | ORAL_TABLET | Freq: Every day | ORAL | Status: DC | PRN
  Filled 2014-01-11: qty 1

## 2014-01-11 MED ORDER — FOLIC ACID 1 MG PO TABS
1.0000 mg | ORAL_TABLET | Freq: Every day | ORAL | Status: DC
  Administered 2014-01-11 – 2014-01-13 (×3): 1 mg via ORAL
  Filled 2014-01-11 (×3): qty 1

## 2014-01-11 MED ORDER — THIAMINE HCL 100 MG/ML IJ SOLN
100.0000 mg | Freq: Every day | INTRAMUSCULAR | Status: DC
Start: 2014-01-11 — End: 2014-01-12
  Filled 2014-01-11 (×2): qty 1

## 2014-01-11 MED ORDER — ZOLPIDEM TARTRATE 5 MG PO TABS
5.0000 mg | ORAL_TABLET | Freq: Every evening | ORAL | Status: DC | PRN
  Administered 2014-01-11 – 2014-01-12 (×2): 5 mg via ORAL
  Filled 2014-01-11 (×2): qty 1

## 2014-01-11 MED ORDER — PANTOPRAZOLE SODIUM 40 MG PO TBEC
40.0000 mg | DELAYED_RELEASE_TABLET | Freq: Every day | ORAL | Status: DC
  Administered 2014-01-11 – 2014-01-13 (×3): 40 mg via ORAL
  Filled 2014-01-11 (×4): qty 1

## 2014-01-11 MED ORDER — DOCUSATE SODIUM 100 MG PO CAPS
100.0000 mg | ORAL_CAPSULE | Freq: Two times a day (BID) | ORAL | Status: DC
  Administered 2014-01-12 – 2014-01-13 (×3): 100 mg via ORAL
  Filled 2014-01-11 (×6): qty 1

## 2014-01-11 MED ORDER — INSULIN ASPART 100 UNIT/ML ~~LOC~~ SOLN
0.0000 [IU] | Freq: Every day | SUBCUTANEOUS | Status: DC
Start: 2014-01-11 — End: 2014-01-12
  Administered 2014-01-11: 5 [IU] via SUBCUTANEOUS

## 2014-01-11 MED ORDER — LORAZEPAM 1 MG PO TABS: 1.0000 mg | ORAL_TABLET | Freq: Four times a day (QID) | ORAL | Status: DC | PRN

## 2014-01-11 MED ORDER — ONDANSETRON HCL 4 MG PO TABS: 8.0000 mg | ORAL_TABLET | Freq: Three times a day (TID) | ORAL | Status: DC | PRN

## 2014-01-11 MED ORDER — SODIUM CHLORIDE 0.9 % IV SOLN: INTRAVENOUS | Status: AC

## 2014-01-11 MED ORDER — NICOTINE 7 MG/24HR TD PT24
7.0000 mg | MEDICATED_PATCH | TRANSDERMAL | Status: DC
  Administered 2014-01-11 – 2014-01-13 (×3): 7 mg via TRANSDERMAL
  Filled 2014-01-11 (×4): qty 1

## 2014-01-11 MED ORDER — MAGNESIUM OXIDE 400 (241.3 MG) MG PO TABS
400.0000 mg | ORAL_TABLET | Freq: Two times a day (BID) | ORAL | Status: DC
  Administered 2014-01-11 – 2014-01-13 (×5): 400 mg via ORAL
  Filled 2014-01-11 (×6): qty 1

## 2014-01-11 MED ORDER — LEVETIRACETAM 500 MG PO TABS
500.0000 mg | ORAL_TABLET | Freq: Two times a day (BID) | ORAL | Status: DC
  Administered 2014-01-11 – 2014-01-13 (×5): 500 mg via ORAL
  Filled 2014-01-11 (×6): qty 1

## 2014-01-11 MED ORDER — INSULIN ASPART 100 UNIT/ML ~~LOC~~ SOLN
0.0000 [IU] | Freq: Three times a day (TID) | SUBCUTANEOUS | Status: DC
Start: 2014-01-11 — End: 2014-01-12
  Administered 2014-01-11 (×2): 3 [IU] via SUBCUTANEOUS
  Administered 2014-01-11: 7 [IU] via SUBCUTANEOUS
  Administered 2014-01-12: 5 [IU] via SUBCUTANEOUS

## 2014-01-11 MED ORDER — LORAZEPAM 2 MG/ML IJ SOLN: 1.0000 mg | Freq: Four times a day (QID) | INTRAMUSCULAR | Status: DC | PRN

## 2014-01-11 MED ORDER — ENSURE COMPLETE PO LIQD
237.0000 mL | Freq: Two times a day (BID) | ORAL | Status: DC
  Administered 2014-01-11 – 2014-01-13 (×4): 237 mL via ORAL

## 2014-01-11 MED ORDER — ADULT MULTIVITAMIN W/MINERALS CH
1.0000 | ORAL_TABLET | Freq: Every day | ORAL | Status: DC
Start: 2014-01-11 — End: 2014-01-13
  Administered 2014-01-11 – 2014-01-13 (×3): 1 via ORAL
  Filled 2014-01-11 (×3): qty 1

## 2014-01-11 NOTE — H&P (Signed)
William Townsend: [phone]  Patient name: William Townsend Medical record number: [id number] Date of birth: 1961/02/12 Age: 53 y.o. Gender: male  Primary Care Provider: [name], DO Consultants: Palliative  Code Status: full   Chief Complaint: ataxia and weakness  Assessment and Plan: DARAY POLGAR is a 53 y.o. male presenting with weakness and ataxia . 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 He also has a history of multiple CVAs, tobacco abuse, T2DM and HTN not currently being treated.   #Weakness/Ataxia: patient states worsening balance and not being able to get out of bed. Able to stand on exam but loses balance upon closing his eyes. CT head showing stable appearance.  He denies any vertigo.  May be the progression of his disease as well as chemotherapy.  Patient was anxious when being interviewed saying "I didn't think I would die this fast." He is currently trying to establish a HCPOA. Prior to admission he was DNR but upon further questioning he rescinded this and wants to be a full code until a palliative meeting can be scheduled.  - admit to med surg, Dr. Nori Riis attending  - B12, folate, RPR, HIV  - thiamine, folate and MVI  - palliative care consult  - PT/OT   #Stage IV small cell lung CA with brain, liver, and spleen mets: Undergoing palliative chemotherapy (s/p 6 cycles) with most recent start of the first cycle of second line chemotherapy with cisplatin and irinotecan. Unfortunately patient had reaction to cisplatin and it had to be stopped.  CT chest/abd/pelvis from appt (4/30) showing disease progression the lung as well as in the liver and abdomen.  Treatment scheduled for (5/7).  - followed by Dr. Charisse March, thrombocytopenia: secondary to chemotherapy.   #History of CVA x3 in 2012: Not on antiplatelet or statin.  - CT head negative - s/p brain surgery so not TPA candidate and poor anti-platelet candidate  #T2DM: found to have hyperglycemia. Intolerant to metformin. Received 10 U insulin in ED.  - sensitive SSI  - CBG's   #hx of HTN: currently not taking any medication. Normotensive at this time.   #Alcohol abuse: patient's drinking has increased as of late due to being confined at home. He is drinking a 1/2 gallon within three days.  - CIWA protocol.  - thiamine and folate   #Tobacco abuse: currently an 1/2 PPD smoker  - nicotine patch   FEN/GI: saline lock, regular diet  Prophylaxis: SCD's as thrombocytopenia   Disposition: admitted to family medicine teaching service, Dr. Ree Kida attending   History of Present Illness: William Townsend is a 53 y.o. male presenting with weakness and ataxia. He has had these problems since October of last year but has gotten worse recently. He states that when he stands his vision becomes black and he has to sit back down. His symptoms resolve when he sits down. Over the course of this weekend he was able to walk only by leaning on the wall. He had the feeling that he was going to fall over if he didn't use the all. He denies any sick contacts, nausea, vomiting, changes in urination, did have a loose stool whereas he normally is constipated. He has been afebrile with normal appetite.   Patient  interesting social situation: He lives with a couple who is his friend Teacher, English as a foreign language and [place]). He has a brother in [place] and Daughter in [place]. He would like to be closer to his daughter, but she does not have transportation or room at her home. He is concerned that his current living situation is not good for him since the couple with who he lives is going from 12 hours during the day. He would like to consider a SNF.   ED course: Found to be hyperglycemic and  received 10 U novolog and 1L bolus   Last Chemo treatment:  PRIOR THERAPY: Systemic chemotherapy with carboplatin for an AUC of 5 given on day 1 and etoposide 120 mg per meter squared given on days 1, 2 and 3 with Neulasta support given on day 4 status post 2 cycles. Carboplatin was reduced to AUC of 4 and etoposide 100 mg/M2 starting from cycle #6, last dose was 09/20/2013.  CURRENT THERAPY: Observation.  DISEASE STAGE: Extensive stage  CHEMOTHERAPY INTENT: Palliative  CURRENT # OF CHEMOTHERAPY CYCLES: 0  CURRENT ANTIEMETICS: Zofran, dexamethasone, Compazine  CURRENT SMOKING STATUS: Current smoker, strongly advised to quit smoking  ORAL CHEMOTHERAPY AND CONSENT: N./A.  CURRENT BISPHOSPHONATES USE: None  PAIN MANAGEMENT: Percocet  NARCOTICS INDUCED CONSTIPATION: None  LIVING WILL AND CODE STATUS: ?  Review Of Systems: Per HPI with the following additions: see hpi  Otherwise 12 point review of systems was performed and was unremarkable.  Patient Active Problem List   Diagnosis Date Noted  . Weakness 01/11/2014  . Localization-related (focal) (partial) epilepsy and epileptic syndromes with complex partial seizures, without mention of intractable epilepsy 12/02/2013  . Protein-calorie malnutrition, severe 10/08/2013  . Acute encephalopathy 10/08/2013  . Small cell lung cancer 06/01/2013  . Brain mass 04/21/2013  . Lesion of lung 04/21/2013  . Liver lesion 04/21/2013  . Tobacco abuse 04/03/2013  . Headache(784.0) 04/02/2013  . Diabetes mellitus, type 2 01/28/2012  . Essential hypertension, benign 01/28/2012  . History of stroke 01/28/2012  . History of alcohol abuse 01/28/2012  . History of lung cancer 01/28/2012  . History of throat cancer 01/28/2012  . Allergic rhinitis 01/28/2012  . Chronic pain of multiple joints 01/28/2012   Past Medical History: Past Medical History  Diagnosis Date  . Atrial fibrillation   . Diabetes mellitus   . 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  Allergy   . Substance abuse   . Depression   . Stroke     3 previous strokes - Oct, Nov, and Dec 2012  . Throat cancer     September 2008  . Lung cancer     lobectomy in 2010  . Heart murmur   . Hx of radiation therapy 05/18/13- 06/01/13    whole brain, 30 Gy, 10 fractions   Past Surgical History: Past Surgical History  Procedure Laterality Date  . Lobectomy    . Craniotomy Left 04/27/2013    Procedure: Left Occipital Craniotomy for resection of tumor with microscope;  Surgeon: Floyce Stakes, MD;  Location: [facility];  Service: Neurosurgery;  Laterality: Left;   Social History: History  Substance Use Topics  . Smoking status: Current Every Day Smoker -- 0.50 packs/day for 30 years    Types: Cigarettes  . Smokeless tobacco: Never Used  . Alcohol Use: 0.0 oz/week    1-2 Cans of beer per week     Comment: couple beers at night   Additional social history: none  Please also  refer to relevant sections of EMR.  Family History: Family History  Problem Relation Age of Onset  . Diabetes Mother   . Early death Mother   . Heart disease Father   . Stroke Father   . Asthma Sister   . Depression Sister   . Seizures Neg Hx    Allergies and Medications: Allergies  Allergen Reactions  . Vicodin [Hydrocodone-Acetaminophen] Itching   Current Facility-Administered Medications on File Prior to Encounter  Medication Dose Route Frequency Provider Last Rate Last Dose  . heparin lock flush 100 unit/mL  500 Units Intravenous Once Curt Bears, MD      . sodium chloride 0.9 % injection 10 mL  10 mL Intravenous PRN Curt Bears, MD   10 mL at 01/06/14 0830   Current Outpatient Prescriptions on File Prior to Encounter  Medication Sig Dispense Refill  . docusate sodium (COLACE) 100 MG capsule Take 100 mg by mouth 2 (two) times daily.      Marland Kitchen guaiFENesin (MUCINEX) 600 MG 12 hr tablet Take by mouth 2 (two) times daily.      Marland Kitchen loratadine (CLARITIN) 10 MG tablet Take 1 tablet (10 mg  total) by mouth daily as needed for allergies.  30 tablet  0  . magnesium oxide (MAG-OX) 400 (241.3 MG) MG tablet Take 1 tablet (400 mg total) by mouth 2 (two) times daily.  60 tablet  0  . omeprazole (PRILOSEC) 20 MG capsule Take 20 mg by mouth daily.      . ondansetron (ZOFRAN) 8 MG tablet Take 1 tablet (8 mg total) by mouth every 8 (eight) hours as needed for nausea or vomiting (start on the 3rd day after chemotherapy.).  30 tablet  1    Objective: BP 148/90  Pulse 100  Temp(Src) 98.3 F (36.8 C) (Oral)  Resp 18  SpO2 99% Exam: General: NAD, cooperative with exam, Frail,  HEENT: oropharynx clear, [place]/AT, poor dention,  Cardiovascular: S1S2, RRR, no rubs, gallops or murmurs  Respiratory: coarse breath sounds, no extra work of breathing,  Abdomen: soft, NTND, +BS Extremities: moves all freely, 5/5 strength in upper and lower extremities  Skin: no rashes  Neuro: alert and oriented, CN2-12, normal heel to shin, normal finger to nose but abnormal coordination. able to stand but becomes unbalanced upon closing eyes.   Labs and Imaging: CBC BMET   Recent Labs Lab 01/10/14 2100  WBC 2.0*  HGB 11.9*  HCT 32.2*  PLT 54*    Recent Labs Lab 01/10/14 2100  NA 138  K 3.5*  CL 91*  CO2 28  BUN 14  CREATININE 0.80  GLUCOSE 318*  CALCIUM 9.0     CT head: No acute intracranial process.  Stable appearance of the head: Status post left parietoccipital  craniotomy for resection of occipital lobe metastasis.  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   Rosemarie Ax, MD 01/11/2014, 3:32 AM PGY-1, [place] Intern Townsend: [phone], text pages welcome  I have evaluated the patient and agree with the assessment and plan above.   Marena Chancy Maricela Bo, MD, [place] 01/12/2014, 11:40 AM Family Medicine Resident, PGY-3

## 2014-01-11 NOTE — Progress Notes (Signed)
Thank you for consulting the Palliative Medicine Team at [facility] to meet your patient's and family's needs.   The reason that you asked [place] to see your patient is for [place] and options.   We have scheduled your patient for a meeting: 5/6 1030  The Surrogate decision make is: self - meeting with brother Bobetta Lime information: [phone]  Other family members that need to be present: none    Your patient is able/unable to participate:able   Vinie Sill, NP Palliative Medicine Team Pager # [phone] (M-F 8a-5p) Team Phone # [phone] (Nights/Weekends)

## 2014-01-11 NOTE — Progress Notes (Signed)
INITIAL NUTRITION ASSESSMENT  DOCUMENTATION CODES Per approved criteria  -Severe malnutrition in the context of chronic illness   INTERVENTION: Add Ensure Complete po BID, each supplement provides 350 kcal and 13 grams of protein. Agree with Regular diet order. RD to continue to follow nutrition care plan.  NUTRITION DIAGNOSIS: Increased nutrient needs related to catabolic illness as evidenced by estimated needs.   Goal: Intake to meet >90% of estimated nutrition needs.  Monitor:  weight trends, lab trends, I/O's, PO intake, supplement tolerance  Reason for Assessment: Malnutrition Screening Tool  53 y.o. 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 Admitted with weakness and ataxia. Work-up ongoing.  Per chart, pt is undergoing palliative chemo (s/p 6 cycles). CT of chest/abd/pelvis on 4/20 shows disease progression to lung, liver and abdomen. Treatment scheduled for 5/7. Palliative care consult pending at this time.  Pt reports ETOH abuse 2/2 being confined at home. Drinks 1/2 gallon within approx 3 days, per H&P. Pt reports that he is limited to certain foods 2/2 poor dentition and lacks salivary glands. Eats a lot of prepared foods such as Ramen noodles and [employer]. States that he typically eats very well while in the hospital and we reviewed the menu and his diet order. Pt agreeable to Ensure at home, he enjoys chocolate flavor.  Pt with 13% wt loss x 6 months.  Nutrition Focused Physical Exam:   Subcutaneous Fat:  Orbital Region: mild wasting  Upper Arm Region: moderate wasting  Thoracic and Lumbar Region: moderate to severe wasting   Muscle:  Temple Region: mild wasting  Clavicle Bone Region: mild wasting  Clavicle and Acromion Bone Region: mild wasting  Scapular Bone Region: NA  Dorsal Hand: mild  wasting  Patellar Region: severe wasting  Anterior Thigh Region: moderate wasting  Posterior Calf Region: severe wasting   Edema: none  Pt meets criteria for severe MALNUTRITION in the context of chronic illness as evidenced by 13% wt loss x 6 months and severe muscle mass loss.  CBG's: 154 - 328 Height: Ht Readings from Last 1 Encounters:  01/11/14 6\' 1"  (1.854 m)    Weight: Wt Readings from Last 1 Encounters:  01/11/14 148 lb 3.2 oz (67.223 kg)    Ideal Body Weight: 184 lb/83.6 kg  % Ideal Body Weight: 80%  Wt Readings from Last 25 Encounters:  01/11/14 148 lb 3.2 oz (67.223 kg)  01/06/14 160 lb 6.4 oz (72.757 kg)  12/21/13 156 lb 14.4 oz (71.169 kg)  12/02/13 153 lb (69.4 kg)  10/21/13 149 lb 9.6 oz (67.858 kg)  10/14/13 149 lb 3.2 oz (67.677 kg)  10/07/13 153 lb 3.5 oz (69.5 kg)  09/20/13 150 lb 6.4 oz (68.221 kg)  09/01/13 154 lb 14.4 oz (70.262 kg)  08/12/13 152 lb 1.6 oz (68.992 kg)  08/09/13 156 lb 8 oz (70.988 kg)  07/19/13 162 lb 11.2 oz (73.8 kg)  06/28/13 169 lb (76.658 kg)  06/14/13 160 lb 3.2 oz (72.666 kg)  06/14/13 161 lb (73.029 kg)  06/01/13 171 lb (77.565 kg)  05/27/13 172 lb 4.8 oz (78.155 kg)  05/21/13 171 lb 14.4 oz (77.973 kg)  05/12/13 168 lb 6.4 oz (76.386 kg)  05/07/13 173 lb 9.6 oz (78.744 kg)  05/06/13 172 lb (78.019 kg)  04/21/13 174 lb 14.4 oz (79.334 kg)  04/21/13 174 lb 14.4  oz (79.334 kg)  04/02/13 175 lb (79.379 kg)  01/28/12 179 lb 12.8 oz (81.557 kg)   Usual Body Weight: 170 lb  % Usual Body Weight: 87%  BMI:  Body mass index is 19.56 kg/(m^2). WNL  Estimated Nutritional Needs: Kcal: 2000 - 2200 Protein: 85 - 100 g Fluid: 2 - 2.2 liters  Skin: intact  Diet Order: General  EDUCATION NEEDS: -No education needs identified at this time   Intake/Output Summary (Last 24 hours) at 01/11/14 1428 Last data filed at 01/11/14 0622  Gross per 24 hour  Intake    450 ml  Output      0 ml  Net    450 ml    Last BM:  5/5  Labs:   Recent Labs Lab 01/06/14 0819 01/10/14 2100 01/11/14 0730  NA 145 138 138  K 3.6 3.5* 3.9  CL  --  91* 94*  CO2 25 28 28   BUN 5.9* 14 15  CREATININE 0.9 0.80 0.63  CALCIUM 8.5 9.0 8.6  MG 1.2* 1.6  --   GLUCOSE 245* 318* 250*    CBG (last 3)   Recent Labs  01/11/14 0525 01/11/14 0828 01/11/14 1255  GLUCAP 154* 244* 328*   Lab Results  Component Value Date   HGBA1C 8.7* 04/21/2013    Scheduled Meds: . sodium chloride   Intravenous STAT  . docusate sodium  100 mg Oral BID  . folic acid  1 mg Oral Daily  . insulin aspart  0-9 Units Subcutaneous TID WC  . levETIRAcetam  500 mg Oral BID  . magnesium oxide  400 mg Oral BID  . multivitamin with minerals  1 tablet Oral Daily  . nicotine  7 mg Transdermal Q24H  . pantoprazole  40 mg Oral Daily  . thiamine  100 mg Oral Daily   Or  . thiamine  100 mg Intravenous Daily    Continuous Infusions:   Past Medical History  Diagnosis Date  . Atrial fibrillation   . Diabetes mellitus   . 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 Allergy   . Substance abuse   . Depression   . Stroke     3 previous strokes - Oct, Nov, and Dec 2012  . Throat cancer     September 2008  . Lung cancer     lobectomy in 2010  . Heart murmur   . Hx of radiation therapy 05/18/13- 06/01/13    whole brain, 30 Gy, 10 fractions    Past Surgical History  Procedure Laterality Date  . Lobectomy    . Craniotomy Left 04/27/2013    Procedure: Left Occipital Craniotomy for resection of tumor with microscope;  Surgeon: Floyce Stakes, MD;  Location: [facility];  Service: Neurosurgery;  Laterality: Left;    Inda Coke MS, RD, LDN Inpatient Registered Dietitian Pager: [phone] After-hours pager: [phone]

## 2014-01-11 NOTE — Evaluation (Signed)
Physical Therapy Evaluation Patient Details Name: William Townsend MRN: [id number] DOB: [birth year] Today's Date: 01/11/2014   History of Present Illness  William Townsend is a 53 y.o. male presenting with weakness and ataxia . 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 He also has a history of multiple CVAs, tobacco abuse, T2DM and HTN not currently being treated  Clinical Impression  Pt pleasant with decreasing function over the last few months and particularly since 4/30 at which time he started feeling dizzy, off balance and unable to mobilize without assist. Pt with bil LE 5/5 with all myotomes but pt fatigues quickly with activity and reports dizziness with change in position to sitting and standing but resolves within a minute or less. Pt will benefit from acute therapy to maximize mobility, balance, function and safety to decrease burden of care and maximize independence.     Follow Up Recommendations SNF;Supervision for mobility/OOB, if able to become mod I could return home    Equipment Recommendations  Rolling walker with 5" wheels    Recommendations for Other Services       Precautions / Restrictions Precautions Precautions: Fall Restrictions Weight Bearing Restrictions: No      Mobility  Bed Mobility Overal bed mobility: Modified Independent                Transfers Overall transfer level: Needs assistance   Transfers: Sit to/from Stand Sit to Stand: Min guard         General transfer comment: cues for hand placement and safety  Ambulation/Gait Ambulation/Gait assistance: Min guard Ambulation Distance (Feet): 15 Feet (x 2 trials) Assistive device: Rolling walker (2 wheeled) Gait Pattern/deviations: Step-through pattern;Decreased stride length;Narrow base of support   Gait velocity  interpretation: Below normal speed for age/gender    Stairs            Wheelchair Mobility    Modified Rankin (Stroke Patients Only)       Balance Overall balance assessment: Needs assistance;History of Falls Sitting-balance support: Feet supported;No upper extremity supported Sitting balance-Leahy Scale: Good       Standing balance-Leahy Scale: Poor                               Pertinent Vitals/Pain No pain    Home Living Family/patient expects to be discharged to:: Assisted living Living Arrangements: Non-relatives/Friends Available Help at Discharge: Available PRN/intermittently Type of Home: House Home Access: Stairs to enter   [employer] of Steps: 3 Home Layout: One level Home Equipment: None      Prior Function Level of Independence: Independent         Comments: pt was living with roommates and caring for himself until this 4/30 when he started to feel dizzy, unsteady and borrowed roommates RW     Hand Dominance        Extremity/Trunk Assessment   Upper Extremity Assessment: Overall WFL for tasks assessed           Lower Extremity Assessment: Overall WFL for tasks assessed      Cervical / Trunk Assessment: Normal  Communication   Communication: No difficulties  Cognition Arousal/Alertness: Awake/alert Behavior During Therapy: WFL for tasks assessed/performed Overall Cognitive Status: Within Functional Limits for tasks assessed  General Comments General comments (skin integrity, edema, etc.): pt reports at least 67falls in the last few months    Exercises        Assessment/Plan    PT Assessment Patient needs continued PT services  PT Diagnosis Abnormality of gait   PT Problem List Decreased activity tolerance;Decreased balance;Decreased mobility;Decreased knowledge of use of DME  PT Treatment Interventions Gait training;DME instruction;Stair training;Functional  mobility training;Therapeutic activities;Therapeutic exercise;Patient/family education;Balance training   PT Goals (Current goals can be found in the Care Plan section) Acute Rehab PT Goals Patient Stated Goal: be able to return home PT Goal Formulation: With patient Time For Goal Achievement: 01/25/14 Potential to Achieve Goals: Good    Frequency Min 3X/week   Barriers to discharge Decreased caregiver support      Co-evaluation               End of Session Equipment Utilized During Treatment: Gait belt Activity Tolerance: Patient limited by fatigue Patient left: in chair;with call bell/phone within reach;with chair alarm set;with nursing/sitter in room Nurse Communication: Mobility status;Precautions         Time: 5638-7564 PT Time Calculation (min): 29 min   Charges:   PT Evaluation $Initial PT Evaluation Tier I: 1 Procedure PT Treatments $Gait Training: 8-22 mins   PT G Codes:          Genessa Beman B Dandrae Kustra 01/11/2014, 8:59 AM Elwyn Reach, [place]

## 2014-01-11 NOTE — Progress Notes (Signed)
Pt arrived to floor via stretcher. Pt was oriented to room & call bell system. VS have been taken. VS are stable with bp-148/90, r-18, o2-9% on Room air, temp-98.3 oral. Pt in no apparent distress at this time. Safety video has been watched. Admitting MD at the bedside.  Awaiting further orders.

## 2014-01-11 NOTE — Progress Notes (Signed)
Spoke with Dr. Tamala Julian via telephone about pt's order for 125cc due at 245 in ED. MD stated it's ok to not give med

## 2014-01-11 NOTE — Progress Notes (Signed)
Occupational Therapy Evaluation Patient Details Name: William Townsend MRN: [id number] DOB: 1960/12/29 Today's Date: 01/11/2014    History of Present Illness William Townsend is a 53 y.o. male presenting with weakness and ataxia . 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 He also has a history of multiple CVAs, tobacco abuse, T2DM and HTN not currently being treated   Clinical Impression   Patient presents to OT with decreased ADL independence and safety; will benefit from skilled OT to maximize independence and facilitate safe discharge.    Follow Up Recommendations  Supervision/Assistance - 24 hour;SNF    Equipment Recommendations  Other (comment) (tbd next venue)    Recommendations for Other Services       Precautions / Restrictions Precautions Precautions: Fall      Mobility Bed Mobility Overal bed mobility: Modified Independent                Transfers Overall transfer level: Needs assistance   Transfers: Sit to/from Stand Sit to Stand: Min guard         General transfer comment: cues for hand placement and safety    Balance                                            ADL Overall ADL's : Needs assistance/impaired Eating/Feeding: Independent (occasionally has tremors BUE that affect his self-feeding)   Grooming: Wash/dry hands;Wash/dry face;Independent   Upper Body Bathing: Set up   Lower Body Bathing: Minimal assistance   Upper Body Dressing : Set up   Lower Body Dressing: Minimal assistance   Toilet Transfer: Min guard   Toileting- Clothing Manipulation and Hygiene: Minimal assistance       Functional mobility during ADLs: Min guard;Rolling walker General ADL Comments: Patient reports that he has had functional decline over the past few weeks. Has  difficulty getting walker into bathroom. Was borrowing a walker from a friend. On eval, pt performed bed mobility mod I, min guard transfers and ambulation in room with RW.     Vision                     Perception     Praxis      Pertinent Vitals/Pain No c/o pain     Hand Dominance Right   Extremity/Trunk Assessment Upper Extremity Assessment Upper Extremity Assessment: Overall WFL for tasks assessed   Lower Extremity Assessment Lower Extremity Assessment: Defer to PT evaluation   Cervical / Trunk Assessment Cervical / Trunk Assessment: Normal   Communication Communication Communication: No difficulties   Cognition Arousal/Alertness: Awake/alert Behavior During Therapy: WFL for tasks assessed/performed Overall Cognitive Status: Within Functional Limits for tasks assessed                     General Comments       Exercises       Shoulder Instructions      Home Living Family/patient expects to be discharged to:: Assisted living Living Arrangements: Non-relatives/Friends Available Help at Discharge: Available PRN/intermittently Type of Home: House Home Access: Stairs to enter Entrance Stairs-Number of Steps: 3   Home Layout: One level  Home Equipment: None   Additional Comments: Tub/shower unit. Walker does not fit inside bathroom.      Prior Functioning/Environment Level of Independence: Independent        Comments: pt was living with roommates and caring for himself until this 4/30 when he started to feel dizzy, unsteady and borrowed roommates RW    OT Diagnosis: Generalized weakness   OT Problem List: Decreased strength;Decreased activity tolerance;Decreased safety awareness;Decreased knowledge of use of DME or AE   OT Treatment/Interventions: Self-care/ADL training;Therapeutic exercise;DME and/or AE instruction;Therapeutic activities;Patient/family education    OT Goals(Current goals can be found in the care  plan section) Acute Rehab OT Goals Patient Stated Goal: be able to return home OT Goal Formulation: With patient Time For Goal Achievement: 01/25/14 Potential to Achieve Goals: Good  OT Frequency: Min 2X/week   Barriers to D/C: Decreased caregiver support          Co-evaluation              End of Session Equipment Utilized During Treatment: Rolling walker  Activity Tolerance: Patient tolerated treatment well Patient left: in chair;with family/visitor present;with call bell/phone within reach;with bed alarm set   Time: 1524-1550 OT Time Calculation (min): 26 min Charges:  OT General Charges $OT Visit: 1 Procedure OT Evaluation $Initial OT Evaluation Tier I: 1 Procedure OT Treatments $Self Care/Home Management : 8-22 mins G-Codes:    William Townsend A William Townsend 01-Feb-2014, 4:05 PM

## 2014-01-11 NOTE — Progress Notes (Signed)
Pt requesting a nicotine patch. Patched MD and order given. Waiting for patch to arrive from pharmacy.

## 2014-01-11 NOTE — Progress Notes (Signed)
Utilization review completed.  

## 2014-01-12 DIAGNOSIS — Z515 Encounter for palliative care: Secondary | ICD-10-CM

## 2014-01-12 DIAGNOSIS — C801 Malignant (primary) neoplasm, unspecified: Secondary | ICD-10-CM | POA: Diagnosis not present

## 2014-01-12 DIAGNOSIS — G939 Disorder of brain, unspecified: Secondary | ICD-10-CM | POA: Diagnosis not present

## 2014-01-12 DIAGNOSIS — R5381 Other malaise: Secondary | ICD-10-CM | POA: Diagnosis not present

## 2014-01-12 LAB — GLUCOSE, CAPILLARY
Glucose-Capillary: 287 mg/dL — ABNORMAL HIGH (ref 70–99)
Glucose-Capillary: 450 mg/dL — ABNORMAL HIGH (ref 70–99)

## 2014-01-12 LAB — FOLATE RBC: RBC Folate: 671 ng/mL — ABNORMAL HIGH (ref >280)

## 2014-01-12 MED ORDER — GABAPENTIN 100 MG PO CAPS
100.0000 mg | ORAL_CAPSULE | Freq: Two times a day (BID) | ORAL | Status: DC
  Administered 2014-01-12 – 2014-01-13 (×2): 100 mg via ORAL
  Filled 2014-01-12 (×3): qty 1

## 2014-01-12 MED ORDER — INSULIN ASPART 100 UNIT/ML ~~LOC~~ SOLN
0.0000 [IU] | Freq: Three times a day (TID) | SUBCUTANEOUS | Status: DC
Start: 2014-01-12 — End: 2014-01-12

## 2014-01-12 MED ORDER — GLIMEPIRIDE 2 MG PO TABS
2.0000 mg | ORAL_TABLET | Freq: Every day | ORAL | Status: DC
  Administered 2014-01-12 – 2014-01-13 (×2): 2 mg via ORAL
  Filled 2014-01-12 (×3): qty 1

## 2014-01-12 NOTE — Consult Note (Signed)
Patient GQ:QPYPPJ XZAVIAR MALOOF      DOB: 01-10-1961      KDT:[id number]     Consult Note from the Palliative Medicine Team at [facility] Requested by: Dr. Raeford Razor     PCP: [name], DO Reason for Consultation: [place] and options    Phone Number:[phone]  Assessment of patients Current state: Mr. Seres is a 53 yo male admitted ataxia and weakness. 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 Also PMH of tonsillar cancer 2008, multiple CVAs, alcohol abuse, tobacco abuse, diabetes mellitus, and hypertension.  I met today with Mr. Degrazia and his brother, Eduard Clos. We discussed his overall prognosis of his cancer and the natural progression of his disease. They are ready to meet and speak with Dr. Julien Nordmann to further discuss treatment options and test results regarding his cancer. He tells me that he knows he is dying but that he isn't ready to die yet. He tells me that he isn't afraid to die. We discussed his code status at length and what full code means and the decisions that may face his family. We discussed HCPOA and making sure this person makes decisions based on what he would want and that he needs to discuss the different scenarios we explored with this person (tracheostomy, feeding tube, etc). I urged him to consider quality of life in this decision and all his decisions. We also discussed his alcohol abuse and he tells me he drinks 1/2 gallon liquor in 3 days (used to be 4 days a few months ago). He does seem slightly more restless with slight tremor that I did not notice yesterday. We discussed his alcohol as a coping mechanism and discussed the effect it may have on his treatments and functional status. He tells me "I don't need alcohol right now - I'm not even missing it." However, he is not prepared to tell me he is planning to stop. I told him that it  is his decision on how he lives out the rest of his life - however long he may have. They have a lot of questions about assisted living and financial questions - CSW has been asked to speak with them. He wants to go home but is open to SNF rehab to increase his strength and for palliative to follow him there (may need help with decisions depending on his meeting with oncology and how he tolerates chemotherapy). I will continue to follow and support Mr. Hibberd.    Goals of Care: 1.  Code Status: FULL    2. Scope of Treatment: Continue all available medical treatments. No limitations to care at this point.    4. Disposition: Considering physical rehab with goal to get back home.    3. Symptom Management:   1. Anxiety/Agitation: Ativan prn per CIWA protocol.  2. Pain: Gabapentin 100 mg tablet BID. Consider increasing after 2-3 days if needed.  3. Bowel Regimen: Colace scheduled.  4. Sleep: Ambien qhs prn.  5. Nausea/Vomiting: Ondansetron prn.   4. Psychosocial: Emotional support provided to patient and family.   5. Spiritual: Chaplain offered but not desired at this time.    Patient Documents Completed or Given: Document Given Completed  Advanced Directives Pkt    MOST    DNR    Gone from My Sight    Hard Choices yes     Brief HPI:  53 yo male admitted ataxia and weakness. 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 Also PMH of tonsillar cancer 2008, multiple CVAs, alcohol abuse, tobacco abuse, diabetes mellitus, and hypertension.    ROS: + tingling/numbness/pain in bilateral feet, denies anxiety/constipation/dyspnea    PMH:  Past Medical History  Diagnosis Date  . Atrial fibrillation   . Diabetes mellitus   . 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 Allergy   . Substance abuse   . Depression   . Stroke     3 previous strokes - Oct, Nov, and Dec 2012  .  Throat cancer     September 2008  . Lung cancer     lobectomy in 2010  . Heart murmur   . Hx of radiation therapy 05/18/13- 06/01/13    whole brain, 30 Gy, 10 fractions     PSH: Past Surgical History  Procedure Laterality Date  . Lobectomy    . Craniotomy Left 04/27/2013    Procedure: Left Occipital Craniotomy for resection of tumor with microscope;  Surgeon: Floyce Stakes, MD;  Location: [facility];  Service: Neurosurgery;  Laterality: Left;   I have reviewed the [place] and SH and  If appropriate update it with new information. Allergies  Allergen Reactions  . Vicodin [Hydrocodone-Acetaminophen] Itching   Scheduled Meds: . docusate sodium  100 mg Oral BID  . feeding supplement (ENSURE COMPLETE)  237 mL Oral BID BM  . folic acid  1 mg Oral Daily  . glimepiride  2 mg Oral Q breakfast  . levETIRAcetam  500 mg Oral BID  . magnesium oxide  400 mg Oral BID  . multivitamin with minerals  1 tablet Oral Daily  . nicotine  7 mg Transdermal Q24H  . pantoprazole  40 mg Oral Daily  . thiamine  100 mg Oral Daily   Continuous Infusions:  PRN Meds:.loratadine, LORazepam, LORazepam, ondansetron, zolpidem    BP 122/82  Pulse 88  Temp(Src) 98.3 F (36.8 C) (Oral)  Resp 18  Ht _0  (1.854 m)  Wt 67.223 kg (148 lb 3.2 oz)  BMI 19.56 kg/m2  SpO2 95%   PPS: 50%   Intake/Output Summary (Last 24 hours) at 01/12/14 1208 Last data filed at 01/12/14 0526  Gross per 24 hour  Intake   1128 ml  Output      0 ml  Net   1128 ml   LBM: 01/11/14  Physical Exam:  General: NAD, alert, appears stated edge HEENT: [place]/AT, no JVD, oral mucosa moist without exudate Chest: Coarse in bases, upper airway clear, no labored breathing CVS: RRR, S1 S2 Abdomen: Soft, NT, ND, +BS Ext: MAE, no edema, warm to touch Neuro: Alert, oriented x 3, follows commands  Labs: CBC    Component Value Date/Time   WBC 2.6* 01/11/2014 0730   WBC 2.7* 01/06/2014 0818   RBC 3.17* 01/11/2014 0730   RBC 3.97* 01/06/2014  0818   HGB 11.9* 01/11/2014 0730   HGB 14.9 01/06/2014 0818   HCT 31.5* 01/11/2014 0730   HCT 42.3 01/06/2014 0818   PLT 54* 01/11/2014 0730   PLT 94* 01/06/2014 0818   MCV 99.4 01/11/2014 0730   MCV 106.6* 01/06/2014 0818   MCH 37.5* 01/11/2014 0730   MCH 37.6* 01/06/2014 0818   MCHC 37.3* 01/11/2014 0730   MCHC 35.2 01/06/2014 0818   RDW 13.8 01/11/2014 0730  RDW 15.8* 01/06/2014 0818   LYMPHSABS 0.6* 01/10/2014 2100   LYMPHSABS 0.9 01/06/2014 0818   MONOABS 0.1 01/10/2014 2100   MONOABS 0.4 01/06/2014 0818   EOSABS 0.0 01/10/2014 2100   EOSABS 0.1 01/06/2014 0818   BASOSABS 0.0 01/10/2014 2100   BASOSABS 0.0 01/06/2014 0818    BMET    Component Value Date/Time   NA 138 01/11/2014 0730   NA 145 01/06/2014 0819   K 3.9 01/11/2014 0730   K 3.6 01/06/2014 0819   CL 94* 01/11/2014 0730   CO2 28 01/11/2014 0730   CO2 25 01/06/2014 0819   GLUCOSE 250* 01/11/2014 0730   GLUCOSE 245* 01/06/2014 0819   BUN 15 01/11/2014 0730   BUN 5.9* 01/06/2014 0819   CREATININE 0.63 01/11/2014 0730   CREATININE 0.9 01/06/2014 0819   CREATININE 1.04 01/28/2012 1454   CALCIUM 8.6 01/11/2014 0730   CALCIUM 8.5 01/06/2014 0819   GFRNONAA >90 01/11/2014 0730   GFRAA >90 01/11/2014 0730    CMP     Component Value Date/Time   NA 138 01/11/2014 0730   NA 145 01/06/2014 0819   K 3.9 01/11/2014 0730   K 3.6 01/06/2014 0819   CL 94* 01/11/2014 0730   CO2 28 01/11/2014 0730   CO2 25 01/06/2014 0819   GLUCOSE 250* 01/11/2014 0730   GLUCOSE 245* 01/06/2014 0819   BUN 15 01/11/2014 0730   BUN 5.9* 01/06/2014 0819   CREATININE 0.63 01/11/2014 0730   CREATININE 0.9 01/06/2014 0819   CREATININE 1.04 01/28/2012 1454   CALCIUM 8.6 01/11/2014 0730   CALCIUM 8.5 01/06/2014 0819   PROT 7.6 01/10/2014 2100   PROT 6.9 01/06/2014 0819   ALBUMIN 3.8 01/10/2014 2100   ALBUMIN 3.5 01/06/2014 0819   AST 59* 01/10/2014 2100   AST 97* 01/06/2014 0819   ALT 31 01/10/2014 2100   ALT 31 01/06/2014 0819   ALKPHOS 115 01/10/2014 2100   ALKPHOS 134 01/06/2014 0819   BILITOT 1.2 01/10/2014 2100    BILITOT 0.88 01/06/2014 0819   GFRNONAA >90 01/11/2014 0730   GFRAA >90 01/11/2014 0730     Time In Time Out Total Time Spent with Patient Total Overall Time  1030 1210 71mn 1062m    Greater than 50%  of this time was spent counseling and coordinating care related to the above assessment and plan.  AlVinie SillNP Palliative Medicine Team Pager # [phone]-F 8a-5p) Team Phone # [phone]/Weekends)

## 2014-01-12 NOTE — Progress Notes (Signed)
Family Medicine Teaching Service Daily Progress Note Intern Pager: [phone]  Patient name: William Townsend Medical record number: [id number] Date of birth: [birth year] Age: 53 y.o. Gender: male  Primary Care Provider: [name], DO Consultants: Palliative  Code Status: full   Pt Overview and Major Events to Date:  5/5: admitted with weakness and balance issues  5/6: Palliative GOC meeting   Assessment and Plan: William Townsend is a 53 y.o. male presenting with weakness and ataxia . 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 He also has a history of multiple CVAs, tobacco abuse, T2DM and HTN not currently being treated.   #Weakness/Ataxia: Improving since admission. He is ambulating with walker to bathroom. Eating well and he thinks that is helping with his weakness.  Patient could have component of diabetic neuropathy contributing to his symptoms. Hgb a1c 8.7 with elevated blood sugars since admission.   - B12: 291 - folate: pending  - RPR, HIV: both NR   - thiamine, folate and MVI  - palliative care consult (input greatly appreciated): [place] meeting today  - PT/OT: SNF  #Stage IV small cell lung CA with brain, liver, and spleen mets: Undergoing palliative chemotherapy (s/p 6 cycles) with most recent start of the first cycle of second line chemotherapy with cisplatin and irinotecan. Unfortunately patient had reaction to cisplatin and it had to be stopped. CT chest/abd/pelvis from appt (4/30) showing disease progression the lung as well as in the liver and abdomen. Treatment scheduled for (5/7).  - followed by Dr. Charisse March, thrombocytopenia: secondary to chemotherapy.   #History of CVA x3 in 2012: Not on antiplatelet or statin.  - CT head negative  - s/p brain surgery so not TPA  candidate and poor anti-platelet candidate   #T2DM: sugars range from 244-355   - moderate SSI  - CBG's   #hx of HTN: currently not taking any medication. Normotensive at this time.   #Alcohol abuse: patient's drinking has increased as of late due to being confined at home. He is drinking a 1/2 gallon within three days.  - CIWA protocol.  - thiamine and folate   #Tobacco abuse: currently an 1/2 PPD smoker  - nicotine patch   FEN/GI: saline lock, regular diet  Prophylaxis: SCD's as thrombocytopenia   Disposition: SNF pending discussions about care   Subjective: Patient feeling better since admission. He is eating well and able to ambulate to the bathroom with walker.   Objective: Temp:  [98.2 F (36.8 C)-98.6 F (37 C)] 98.3 F (36.8 C) (05/06 0418) Pulse Rate:  [80-88] 88 (05/06 0418) Resp:  [18] 18 (05/06 0418) BP: (122-138)/(81-87) 122/82 mmHg (05/06 0418) SpO2:  [95 %-99 %] 95 % (05/06 0418) Physical Exam: General: NAD, cooperative with exam, Frail,  HEENT:  poor dention,  Cardiovascular: S1S2, RRR, no rubs, gallops or murmurs  Respiratory: coarse breath sounds, no extra work of breathing,  Extremities: moves all freely, Skin: no rashes  Neuro: alert and oriented,   Laboratory:  Recent Labs Lab 01/06/14 0818 01/10/14 2100 01/11/14 0730  WBC 2.7* 2.0* 2.6*  HGB 14.9 11.9* 11.9*  HCT 42.3 32.2* 31.5*  PLT 94* 54* 54*    Recent Labs Lab 01/06/14 0819 01/10/14 2100 01/11/14 0730  NA 145 138 138  K 3.6 3.5* 3.9  CL  --  91* 94*  CO2 25 28 28   BUN 5.9* 14 15  CREATININE 0.9 0.80 0.63  CALCIUM 8.5 9.0 8.6  PROT 6.9 7.6  --   BILITOT 0.88 1.2  --   ALKPHOS 134 115  --   ALT 31 31  --   AST 97* 59*  --   GLUCOSE 245* 318* 250*    Recent Labs Lab 01/11/14 0828 01/11/14 1255 01/11/14 1702 01/11/14 2102 01/12/14 0859  GLUCAP 244* 328* 250* 355* 287*     Imaging/Diagnostic Tests:   Rosemarie Ax, MD 01/12/2014, 9:39 AM PGY-1, [place] Intern pager: [phone], text pages welcome

## 2014-01-12 NOTE — Clinical Social Work Psychosocial (Signed)
Clinical Social Work Department BRIEF PSYCHOSOCIAL ASSESSMENT 01/12/2014  Patient:  William Townsend, William Townsend     Account Number:  [id number]     Admit date:  01/10/2014  Clinical Social Worker:  Lovey Newcomer  Date/Time:  01/12/2014 11:00 AM  Referred by:  Physician  Date Referred:  01/12/2014 Referred for  SNF Placement  ALF Placement   Other Referral:   Interview type:  Patient Other interview type:   Patient and brother were interviewed at bedside.    PSYCHOSOCIAL DATA Living Status:  FRIEND(S) Admitted from facility:   Level of care:   Primary support name:  William Townsend Primary support relationship to patient:  CHILD, ADULT Degree of support available:   Support is good.    CURRENT CONCERNS Current Concerns  Post-Acute Placement   Other Concerns:    SOCIAL WORK ASSESSMENT / PLAN CSW met with patient and brother at bedside to complete assessment. CSW explained that physical therapy has recommended SNF for patient. Patient states that he believes he would be able to return home because of his progress while in the hospital. However, patient states that he would be agreeable to going to short term SNF placement if necessary. Patient and brother had many questions about the financial aspect of SNF/ALF placement, CSW answered these questions fully. CSW explained that a referral will be made to SNFs and CSW will follow up with offers. CSW explained SNF search/placement process.   Assessment/plan status:  Psychosocial Support/Ongoing Assessment of Needs Other assessment/ plan:   Complete FL2, Fax, PASRR   Information/referral to community resources:   CSW contact information and SNF list given to patient.    PATIENT'S/FAMILY'S RESPONSE TO PLAN OF CARE: Patient states that he would like to return home if possible as he "feels strong enough". He states that he is agreeable to SNF placement if necessary. CSW will make referrals. Patient and brother were both pleasant,  appropriate, and appreciative of CSW visit. CSW will assist with DC when appropriate.       Liz Beach MSW, [place], [place], [phone]

## 2014-01-12 NOTE — Progress Notes (Signed)
Occupational Therapy Treatment Patient Details Name: William Townsend MRN: [id number] DOB: [birth year] Today's Date: 01/12/2014    History of present illness MARTELL MCFADYEN is a 53 y.o. male presenting with weakness and ataxia . 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 He also has a history of multiple CVAs, tobacco abuse, T2DM and HTN not currently being treated   OT comments  Pt making progress with functional goals and should continue with acute OT services to increase level of function and safety. Pt provided with foam gripper for utensils and is able to return demonstrate proper use  Follow Up Recommendations  Supervision/Assistance - 24 hour;SNF    Equipment Recommendations  3 in 1 bedside comode;Tub/shower seat    Recommendations for Other Services      Precautions / Restrictions Precautions Precautions: Fall Restrictions Weight Bearing Restrictions: No       Mobility Bed Mobility Overal bed mobility: Modified Independent                Transfers Overall transfer level: Modified independent Equipment used: Rolling walker (2 wheeled) Transfers: Sit to/from Stand                Balance Overall balance assessment: Needs assistance Sitting-balance support: No upper extremity supported;Feet supported Sitting balance-Leahy Scale: Good     Standing balance support: Single extremity supported;During functional activity Standing balance-Leahy Scale: Fair                     ADL               Lower Body Bathing: Min guard;Supervison/ safety;Set up       Lower Body Dressing: Supervision/safety;Set up;Min guard   Toilet Transfer: Modified Independent   Toileting- Clothing Manipulation and Hygiene: Supervision/safety       Functional mobility during ADLs: Rolling  walker;Modified independent General ADL Comments: Pt states that he feels much better today, is moving well with RW      Vision  wears reading glasses                              Cognition   Behavior During Therapy: [facility] for tasks assessed/performed Overall Cognitive Status: Within Functional Limits for tasks assessed                                                  General Comments  Pt pleasant and cooperative    Pertinent Vitals/ Pain       No c/o pain, VSS                                            Prior Functioning/Environment  independent           Frequency Min 2X/week     Progress Toward Goals  OT Goals(current goals can now be found in the care plan section)  Progress towards OT goals: Progressing toward goals  Acute Rehab OT Goals Patient Stated Goal: be able to return home  Plan Discharge plan remains  appropriate                     End of Session Equipment Utilized During Treatment: Rolling walker   Activity Tolerance Patient tolerated treatment well   Patient Left in chair;with call bell/phone within reach;with bed alarm set;Other (comment) (SW in room to speak with pt)             Time: 7673-4193 OT Time Calculation (min): 25 min  Charges: OT General Charges $OT Visit: 1 Procedure OT Treatments $Self Care/Home Management : 8-22 mins $Therapeutic Activity: 8-22 mins  Mosetta Putt 01/12/2014, 2:02 PM

## 2014-01-12 NOTE — Clinical Social Work Placement (Signed)
Clinical Social Work Department CLINICAL SOCIAL WORK PLACEMENT NOTE 01/12/2014  Patient:  William Townsend, William Townsend  Account Number:  [id number] Admit date:  01/10/2014  Clinical Social Worker:  Kemper Durie, [place]  Date/time:  01/12/2014 11:30 AM  Clinical Social Work is seeking post-discharge placement for this patient at the following level of care:   [place]   (*CSW will update this form in Epic as items are completed)   01/12/2014  Patient/family provided with [place] Department of Clinical Social Work's list of facilities offering this level of care within the geographic area requested by the patient (or if unable, by the patient's family).  01/12/2014  Patient/family informed of their freedom to choose among providers that offer the needed level of care, that participate in Medicare, Medicaid or managed care program needed by the patient, have an available bed and are willing to accept the patient.  01/12/2014  Patient/family informed of [facility]' ownership interest in [facility], as well as of the fact that they are under no obligation to receive care at this facility.  PASARR submitted to EDS on 01/12/2014 PASARR number received from EDS on   FL2 transmitted to all facilities in geographic area requested by pt/family on  01/12/2014 FL2 transmitted to all facilities within larger geographic area on   Patient informed that his/her managed care company has contracts with or will negotiate with  certain facilities, including the following:     Patient/family informed of bed offers received:   Patient chooses bed at  Physician recommends and patient chooses bed at    Patient to be transferred to  on   Patient to be transferred to facility by   The following physician request were entered in Epic:   Additional Comments:   Liz Beach MSW, [place], [place], [phone]

## 2014-01-12 NOTE — Clinical Social Work Note (Signed)
FL2 and 30 day note placed on patient's chart for MD signature.  Liz Beach MSW, [place], [place], [phone]

## 2014-01-12 NOTE — Progress Notes (Signed)
FMTS Attending Note  I personally saw and evaluated the patient. The plan of care was discussed with the resident team. I agree with the assessment and plan as documented by the resident. Patient seen at 37.   53 y/o male with PMH stage 4 small cell lung cancer with metastases to the brain who presented with worsening ataxia/unsteadiness on feet. Symptoms have improved since time of admission. Goals of care meeting planned for today at 1030.  Neuro: slight intention tremor with bilateral finger to nose, no difficulty with heel to shin bilaterally, wide based gait while using walker, no clonus, no asterixis  Ataxia/Unsteadiness likely due to combination of brain mets and chronic alcohol use. No current signs of alcohol withdrawal. Patient counseled on importance of alcohol cessation. Await discharge planning until after goals of care meeting.   Dossie Arbour MD

## 2014-01-12 NOTE — Progress Notes (Signed)
Inpatient Diabetes Program Recommendations  AACE/ADA: New Consensus Statement on Inpatient Glycemic Control (2013)  Target Ranges:  Prepandial:   less than 140 mg/dL      Peak postprandial:   less than 180 mg/dL (1-2 hours)      Critically ill patients:  140 - 180 mg/dL   Reason for Assessment: Hyperglycemia  Diabetes history: DM2 Outpatient Diabetes medications: None Current orders for Inpatient glycemic control: Amaryl 2 mg QD.  Results for ROBEN, SCHLIEP (MRN [id number]) as of 01/12/2014 14:34  Ref. Range 01/11/2014 12:55 01/11/2014 17:02 01/11/2014 21:02 01/12/2014 08:59 01/12/2014 12:36  Glucose-Capillary Latest Range: 70-99 mg/dL 328 (H) 250 (H) 355 (H) 287 (H) 450 (H)   Consider addition of Novolog moderate tidwc and hs. May benefit from small amount of basal insulin - Lantus 10 units QHS.  Will continue to follow. Thank you. Lorenda Peck, RD, LDN, CDE Inpatient Diabetes Coordinator [phone]

## 2014-01-13 ENCOUNTER — Ambulatory Visit: Payer: Medicaid Other | Admitting: Radiation Oncology

## 2014-01-13 ENCOUNTER — Ambulatory Visit: Payer: Medicaid Other

## 2014-01-13 ENCOUNTER — Other Ambulatory Visit: Payer: Medicaid Other

## 2014-01-13 ENCOUNTER — Telehealth: Payer: Self-pay | Admitting: Internal Medicine

## 2014-01-13 DIAGNOSIS — R51 Headache: Secondary | ICD-10-CM

## 2014-01-13 DIAGNOSIS — K7689 Other specified diseases of liver: Secondary | ICD-10-CM

## 2014-01-13 LAB — GLUCOSE, CAPILLARY: Glucose-Capillary: 358 mg/dL — ABNORMAL HIGH (ref 70–99)

## 2014-01-13 MED ORDER — ENSURE COMPLETE PO LIQD: 237.0000 mL | Freq: Two times a day (BID) | ORAL | Status: AC

## 2014-01-13 MED ORDER — GLIMEPIRIDE 2 MG PO TABS: 2.0000 mg | ORAL_TABLET | Freq: Every day | ORAL | Status: DC

## 2014-01-13 MED ORDER — GABAPENTIN 100 MG PO CAPS: 100.0000 mg | ORAL_CAPSULE | Freq: Two times a day (BID) | ORAL | Status: DC

## 2014-01-13 MED ORDER — INSULIN GLARGINE 100 UNIT/ML ~~LOC~~ SOLN
10.0000 [IU] | Freq: Every day | SUBCUTANEOUS | Status: DC
  Administered 2014-01-13: 10 [IU] via SUBCUTANEOUS
  Filled 2014-01-13: qty 0.1

## 2014-01-13 MED ORDER — GLUCOSE BLOOD VI STRP: ORAL_STRIP | Status: AC

## 2014-01-13 MED ORDER — ACCU-CHEK MULTICLIX LANCETS MISC: Status: DC

## 2014-01-13 MED ORDER — FOLIC ACID 1 MG PO TABS: 1.0000 mg | ORAL_TABLET | Freq: Every day | ORAL | Status: DC

## 2014-01-13 MED ORDER — ACCU-CHEK NANO SMARTVIEW W/DEVICE KIT: PACK | Status: AC

## 2014-01-13 MED ORDER — THIAMINE HCL 100 MG PO TABS: 100.0000 mg | ORAL_TABLET | Freq: Every day | ORAL | Status: DC

## 2014-01-13 MED ORDER — HEPARIN SOD (PORK) LOCK FLUSH 100 UNIT/ML IV SOLN
500.0000 [IU] | INTRAVENOUS | Status: AC | PRN
  Administered 2014-01-13: 500 [IU]

## 2014-01-13 NOTE — Progress Notes (Signed)
Family Medicine Teaching Service Daily Progress Note Intern Pager: [phone]  Patient name: William Townsend Medical record number: [id number] Date of birth: [birth year] Age: 53 y.o. Gender: male  Primary Care Provider: [name], DO Consultants: Palliative  Code Status: full   Pt Overview and Major Events to Date:  5/5: admitted with weakness and balance issues  5/6: Palliative GOC meeting   Assessment and Plan: William Townsend is a 53 y.o. male presenting with weakness and ataxia . 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 He also has a history of multiple CVAs, tobacco abuse, T2DM and HTN not currently being treated.   #Weakness/Ataxia: Weakness and balance issues have resolved since admission. He feels much stronger and able to ambulate in the hallway, at times without the walker. Feel that his EtOH drinking was a major contributor. He understands that going home he cannot start drinking again.    - thiamine, folate and MVI  - palliative care consult (input greatly appreciated): Still full care  - Gabapentin 300 mg TID, could increase  - PT/OT: SNF  #Stage IV small cell lung CA with brain, liver, and spleen mets: Undergoing palliative chemotherapy (s/p 6 cycles) with most recent start of the first cycle of second line chemotherapy with cisplatin and irinotecan. Unfortunately patient had reaction to cisplatin and it had to be stopped. CT chest/abd/pelvis from appt (4/30) showing disease progression the lung as well as in the liver and abdomen. Treatment scheduled for (5/7).  - Will have surveillance MRI upon discharge after discussing with Dr. Tammi Klippel (Rad/Onc).  - followed by Dr. Charisse March, thrombocytopenia: secondary to chemotherapy.   #History of CVA x3 in 2012: Not on  antiplatelet or statin.  - CT head negative  - s/p brain surgery so not TPA candidate and poor anti-platelet candidate   #T2DM: Novolog was stopped yesterday. Last checked BG 450.   - insulin 10 U this am  - amaryl 2 mg, tolerating    #hx of HTN: currently not taking any medication. Normotensive at this time.   #Alcohol abuse: Has not received any ativan to date.  - CIWA protocol.  - thiamine and folate   #Tobacco abuse: currently an 1/2 PPD smoker  - nicotine patch   FEN/GI: saline lock, regular diet  Prophylaxis: SCD's as thrombocytopenia   Disposition: home with home health, refusing SNF placement    Subjective: Patient feeling much better since admission. [place] meeting held yesterday. He is wanting to go home with [facility].   Objective: Temp:  [98.1 F (36.7 C)-98.4 F (36.9 C)] 98.3 F (36.8 C) (05/07 0514) Pulse Rate:  [88-97] 97 (05/07 0514) Resp:  [18] 18 (05/07 0514) BP: (112-128)/(80-82) 112/81 mmHg (05/07 0514) SpO2:  [94 %-98 %] 94 % (05/07 0514) Physical Exam: General: NAD, cooperative with exam, Frail,  HEENT:  poor dention,  Cardiovascular: S1S2, RRR, no rubs, gallops or murmurs  Respiratory: clear breath sounds, no extra work of breathing,  Extremities: moves all freely, Skin: no rashes  Neuro: alert and oriented,   Laboratory:  Recent Labs Lab 01/10/14 2100 01/11/14 0730  WBC 2.0* 2.6*  HGB 11.9* 11.9*  HCT 32.2* 31.5*  PLT 54* 54*    Recent Labs Lab 01/10/14 2100 01/11/14 0730  NA 138 138  K 3.5* 3.9  CL 91* 94*  CO2 28 28  BUN 14 15  CREATININE 0.80 0.63  CALCIUM 9.0 8.6  PROT 7.6  --   BILITOT 1.2  --   ALKPHOS 115  --   ALT 31  --   AST 59*  --   GLUCOSE 318* 250*    Recent Labs Lab 01/11/14 1255 01/11/14 1702 01/11/14 2102 01/12/14 0859 01/12/14 1236  GLUCAP 328* 250* 355* 287* 450*     Imaging/Diagnostic Tests:   Rosemarie Ax, MD 01/13/2014, 8:29 AM PGY-1, [place] Intern pager:  [phone], text pages welcome

## 2014-01-13 NOTE — Clinical Social Work Note (Signed)
Per MD patient has refused SNF placement. CSW signing off at this time, as patient will return home.   Liz Beach MSW, [place], [place], [phone]

## 2014-01-13 NOTE — Care Management Note (Signed)
    Page 1 of 2   01/13/2014     3:14:20 PM CARE MANAGEMENT NOTE 01/13/2014  Patient:  William Townsend, William Townsend   Account Number:  [id number]  Date Initiated:  01/13/2014  Documentation initiated by:  William Townsend  Subjective/Objective Assessment:   dx weakness  admit- lives with roommate.     Action/Plan:   pt eval- recs snf, pt refuses snf.  patient perfers outpt pt.   Anticipated DC Date:  01/13/2014   Anticipated DC Plan:  HOME/SELF CARE      DC Planning Services  CM consult      PAC Choice  DURABLE MEDICAL EQUIPMENT   Choice offered to / List presented to:  [name]   DME arranged  William Townsend      DME agency  William Townsend.        Status of service:  Completed, signed off Medicare Important Message given?   (If response is "NO", the following Medicare IM given date fields will be blank) Date Medicare IM given:   Date Additional Medicare IM given:    Discharge Disposition:  HOME/SELF CARE  Per UR Regulation:  Reviewed for med. necessity/level of care/duration of stay  If discussed at [place] of Stay Meetings, dates discussed:    Comments:  01/13/14 [place], BSN [phone] patient is for dc today, he lives with room mate, per physical therapy recs snf, patient is refusing snf. Patient states prefers going to outpt pt, NCM sent referral thru epic to Outpt neuro rehab, they will be in touch with patient next week to let him know how many visits medicaid will pay for and if he needs further asst , instructed patient to contact [facility] pateint accounting at [phone]. Patient can not have [place] if going to outpt neuro rehab. Patient states he did not need a HHRN.  NCM informed Resident of this information.  Patient chose [facility] for DME , shower stool and rolling waker, William Townsend notified, dme brought up to room.

## 2014-01-13 NOTE — Discharge Instructions (Signed)
Alcohol and Nutrition °Nutrition serves two purposes. It provides energy. It also maintains body structure and function. Food supplies energy. It also provides the building blocks needed to replace worn or damaged cells. Alcoholics often eat poorly. This limits their supply of essential nutrients. This affects energy supply and structure maintenance. Alcohol also affects the body's nutrients in: °· Digestion. °· Storage. °· Using and getting rid of waste products. °IMPAIRMENT OF NUTRIENT DIGESTION AND UTILIZATION  °· Once ingested, food must be broken down into small components (digested). Then it is available for energy. It helps maintain body structure and function. Digestion begins in the mouth. It continues in the stomach and intestines, with help from the pancreas. The nutrients from digested food are absorbed from the intestines into the blood. Then they are carried to the liver. The liver prepares nutrients for: °· Immediate use. °· Storage and future use. °· Alcohol inhibits the breakdown of nutrients into usable molecules. °· It decreases secretion of digestive enzymes from the pancreas. °· Alcohol impairs nutrient absorption by damaging the cells lining the stomach and intestines. °· It also interferes with moving some nutrients into the blood. °· In addition, nutritional deficiencies themselves may lead to further absorption problems. °· For example, folate deficiency changes the cells that line the small intestine. This impairs how water is absorbed. It also affects absorbed nutrients. These include glucose, sodium, and additional folate. °· Even if nutrients are digested and absorbed, alcohol can prevent them from being fully used. It changes their transport, storage, and excretion. Impaired utilization of nutrients by alcoholics is indicated by: °· Decreased liver stores of vitamins, such as vitamin A. °· Increased excretion of nutrients such as fat. °ALCOHOL AND ENERGY SUPPLY  °· Three basic  nutritional components found in food are: °· Carbohydrates. °· Proteins. °· Fats. °· These are used as energy. Some alcoholics take in as much as 50% of their total daily calories from alcohol. They often neglect important foods. °· Even when enough food is eaten, alcohol can impair the ways the body controls blood sugar (glucose) levels. It may either increase or decrease blood sugar. °· In non-diabetic alcoholics, increased blood sugar (hyperglycemia) is caused by poor insulin secretion. It is usually temporary. °· Decreased blood sugar (hypoglycemia) can cause serious injury even if this condition is short-lived. Low blood sugar can happen when a fasting or malnourished person drinks alcohol. When there is no food to supply energy, stored sugar is used up. The products of alcohol inhibit forming glucose from other compounds such as amino acids. As a result, alcohol causes the brain and other body tissue to lack glucose. It is needed for energy and function. °· Alcohol is an energy source. But how the body processes and uses the energy from alcohol is complex. Also, when alcohol is substituted for carbohydrates, subjects tend to lose weight. This indicates that they get less energy from alcohol than from food. °ALCOHOL - MAINTAINING CELL STRUCTURE AND FUNCTION  °Structure °Cells are made mostly of protein. So an adequate protein diet is important for maintaining cell structure. This is especially true if cells are being damaged. Research indicates that alcohol affects protein nutrition by causing impaired: °· Digestion of proteins to amino acids. °· Processing of amino acids by the small intestine and liver. °· Synthesis of proteins from amino acids. °· Protein secretion by the liver. °Function °Nutrients are essential for the body to function well. They provide the tools that the body needs to work well:  °·   Proteins.  Vitamins.  Minerals. Alcohol can disrupt body function. It may cause nutrient  deficiencies. And it may interfere with the way nutrients are processed. Vitamins  Vitamins are essential to maintain growth and normal metabolism. They regulate many of the body`s processes. Chronic heavy drinking causes deficiencies in many vitamins. This is caused by eating less. And, in some cases, vitamins may be poorly absorbed. For example, alcohol inhibits fat absorption. It impairs how the vitamins A, E, and D are normally absorbed along with dietary fats. Not enough vitamin A may cause night blindness. Not enough vitamin D may cause softening of the bones.  Some alcoholics lack vitamins A, C, D, E, K, and the B vitamins. These are all involved in wound healing and cell maintenance. In particular, because vitamin K is necessary for blood clotting, lacking that vitamin can cause delayed clotting. The result is excess bleeding. Lacking other vitamins involved in brain function may cause severe neurological damage. Minerals Deficiencies of minerals such as calcium, magnesium, iron, and zinc are common in alcoholics. The alcohol itself does not seem to affect how these minerals are absorbed. Rather, they seem to occur secondary to other alcohol-related problems, such as:  Less calcium absorbed.  Not enough magnesium.  More urinary excretion.  Vomiting.  Diarrhea.  Not enough iron due to gastrointestinal bleeding.  Not enough zinc or losses related to other nutrient deficiencies.  Mineral deficiencies can cause a variety of medical consequences. These range from calcium-related bone disease to zinc-related night blindness and skin lesions. ALCOHOL, MALNUTRITION, AND MEDICAL COMPLICATIONS  Liver Disease   Alcoholic liver damage is caused primarily by alcohol itself. But poor nutrition may increase the risk of alcohol-related liver damage. For example, nutrients normally found in the liver are known to be affected by drinking alcohol. These include carotenoids, which are the major  sources of vitamin A, and vitamin E compounds. Decreases in such nutrients may play some role in alcohol-related liver damage. Pancreatitis  Research suggests that malnutrition may increase the risk of developing alcoholic pancreatitis. Research suggests that a diet lacking in protein may increase alcohol's damaging effect on the pancreas. Brain  Nutritional deficiencies may have severe effects on brain function. These may be permanent. Specifically, thiamine deficiencies are often seen in alcoholics. They can cause severe neurological problems. These include:  Impaired movement.  Memory loss seen in Wernicke-Korsakoff syndrome. Pregnancy  Alcohol has toxic effects on fetal development. It causes alcohol-related birth defects. They include fetal alcohol syndrome. Alcohol itself is toxic to the fetus. Also, the nutritional deficiency can affect how the fetus develops. That may compound the risk of developmental damage.  Nutritional needs during pregnancy are 10% to 30% greater than normal. Food intake can increase by as much as 140% to cover the needs of both mother and fetus. An alcoholic mother`s nutritional problems may adversely affect the nutrition of the fetus. And alcohol itself can also restrict nutrition flow to the fetus. NUTRITIONAL STATUS OF ALCOHOLICS  Techniques for assessing nutritional status include:  Taking body measurements to estimate fat reserves. They include:  Weight.  Height.  Mass.  Skin fold thickness.  Performing blood analysis to provide measurements of circulating:  Proteins.  Vitamins.  Minerals.  These techniques tend to be imprecise. For many nutrients, there is no clear "cut-off" point that would allow an accurate definition of deficiency. So assessing the nutritional status of alcoholics is limited by these techniques. Dietary status may provide information about the risk of developing nutritional problems.  Dietary status is assessed by:  Taking  patients' dietary histories.  Evaluating the amount and types of food they are eating.  It is difficult to determine what exact amount of alcohol begins to have damaging effects on nutrition. In general, moderate drinkers have 2 drinks or less per day. They seem to be at little risk for nutritional problems. Various medical disorders begin to appear at greater levels.  Research indicates that the majority of even the heaviest drinkers have few obvious nutritional deficiencies. Many alcoholics who are hospitalized for medical complications of their disease do have severe malnutrition. Alcoholics tend to eat poorly. Often they eat less than the amounts of food necessary to provide enough:  Carbohydrates.  Protein.  Fat.  Vitamins A and C.  B vitamins.  Minerals like calcium and iron. Of major concern is alcohol's effect on digesting food and use of nutrients. It may shift a mildly malnourished person toward severe malnutrition. Document Released: 06/20/2005 Document Revised: 11/18/2011 Document Reviewed: 12/04/2005 [facility] Patient Information 2014 [place].

## 2014-01-13 NOTE — Progress Notes (Signed)
PCP Progress Note:  I saw William Townsend around 2100 on 01/12/14 to discuss his current hospitalization. Briefly, he feels like he has significantly improved since admission and he is tolerating ambulation with assistance of walker. He still c/o difficulty with his legs, but believes that current therapy with gabapentin is working. Discussing his disposition, he expresses his goal to regain strength and desire to return home on discharge with home health arrangements. He described increasing family / friend support for assistance at home, however he agrees this alone would likely not be adequate assistance. He is open to and considering the option of short-term SNF for rehab, to better achieve his goal of regaining his strength, however he remains unsure if this is the best option for him.   I agree with the excellent care provided by the primary team of [facility] and want to thank them for their continued efforts in caring for my patient.  [name], [place], PGY-1

## 2014-01-13 NOTE — Clinical Social Work Note (Signed)
Patient has a bed at [facility] in [place]. CSW will present bed offer to patient this morning.   Liz Beach MSW, [place], [place], [phone]

## 2014-01-13 NOTE — Progress Notes (Signed)
Patient was referred by nursing staff. Chaplain visited patient. Patient denied any services from chaplain, advised chaplain that he was not a very spiritual person. Patient thank chaplain for the visit. Chaplain advised patient if he needed any further service to ask nursing staff to contact chaplain on duty. Charyl Dancer, [place]  01/13/14 1200  Clinical Encounter Type  Visited With Patient  Visit Type Initial  Referral From Nurse  Consult/Referral To Chaplain  Spiritual Encounters  Spiritual Needs Other (Comment)  Stress Factors  Patient Stress Factors None identified  Family Stress Factors None identified

## 2014-01-13 NOTE — Progress Notes (Signed)
Patient was discharged home by MD order; discharged instructions  review and give to patient; patient is going home with port cath in right chest - clean, dry intact; skin intact; patient will be escorted to the car by volunteer via wheelchair.

## 2014-01-13 NOTE — Progress Notes (Signed)
FMTS Attending Note  I personally saw and evaluated the patient. The plan of care was discussed with the resident team. I agree with the assessment and plan as documented by the resident.   Benedicta Sultan MD 

## 2014-01-13 NOTE — H&P (Signed)
Call Pager [phone] for any questions or notifications regarding this patient  FMTS Attending Admission Note: Sara Neal MD Attending pager:[phone] I  have seen and examined this patient, reviewed their chart. I have discussed this patient with the resident. I agree with the resident's findings, assessment and care plan. 

## 2014-01-13 NOTE — Telephone Encounter (Signed)
per amy pt in hospital and to cx tx

## 2014-01-13 NOTE — Discharge Summary (Signed)
[facility] Discharge Summary  Patient name: William Townsend Medical record number: [id number] Date of birth: 1961-05-26 Age: 53 y.o. Gender: male Date of Admission: 01/10/2014  Date of Discharge: 01/13/14 Admitting Physician: Dickie La, MD  Primary Care Provider: [name], DO Consultants: Palliative   Indication for Hospitalization: weakness/ataxia  Discharge Diagnoses/Problem List:  Patient Active Problem List   Diagnosis Date Noted  . Palliative care encounter 01/12/2014  . Weakness 01/11/2014  . Ataxia 01/11/2014  . Localization-related (focal) (partial) epilepsy and epileptic syndromes with complex partial seizures, without mention of intractable epilepsy 12/02/2013  . Protein-calorie malnutrition, severe 10/08/2013  . Acute encephalopathy 10/08/2013  . Small cell lung cancer 06/01/2013  . Brain mass 04/21/2013  . Lesion of lung 04/21/2013  . Liver lesion 04/21/2013  . Tobacco abuse 04/03/2013  . Headache(784.0) 04/02/2013  . Diabetes mellitus, type 2 01/28/2012  . Essential hypertension, benign 01/28/2012  . History of stroke 01/28/2012  . History of alcohol abuse 01/28/2012  . History of lung cancer 01/28/2012  . History of throat cancer 01/28/2012  . Allergic rhinitis 01/28/2012  . Chronic pain of multiple joints 01/28/2012     Disposition: home, [place]   Discharge Condition: improved   Discharge Exam:  General: NAD, cooperative with exam, Frail,  HEENT: poor dention,  Cardiovascular: S1S2, RRR, no rubs, gallops or murmurs  Respiratory: clear breath sounds, no extra work of breathing,  Extremities: moves all freely,  Skin: no rashes  Neuro: alert and oriented,   Brief Hospital Course:  BRNADON Townsend is a 53 y.o. male presenting with weakness and ataxia . 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 He also has a history of multiple CVAs, tobacco abuse, T2DM and HTN not currently being treated.   #Weakness/Ataxia: Patient presented with overall weakness and balance issues. His CT was negative for any acute changes. His exam showed no gross deficits with slight tremor with b/l finger to nose and no difficulty with heel to shin b/l.  He had a wide based gait and walking with a walk. He was walking without a walk during admission and stregnth much improved. All of his symptoms were most likely related to his increase in drinking alcohol.  PT/OT recommended SNF placement but he didn't want placement. Palliative was consulted and goals outlined below. He went [facility] with [facility] and HHPT.  He was much improved upon discharge.   #Stage IV small cell lung CA with brain, liver, and spleen mets: Undergoing palliative chemotherapy (s/p 6 cycles) with most recent start of the first cycle of second line chemotherapy with cisplatin and irinotecan. Unfortunately patient had reaction to cisplatin and it had to be stopped. CT chest/abd/pelvis from appt (4/30) showing disease progression the lung as well as in the liver and abdomen. Will have surveillance MRI upon discharge after discussing with Dr. Tammi Klippel (Rad/Onc). Will call his Dr. Aundra Dubin to schedule next treatment.   #Anemia, thrombocytopenia: secondary to chemotherapy.   #History of CVA x3 in 2012: Not on antiplatelet or statin. CT head negative. He is s/p brain surgery so not TPA candidate and poor anti-platelet candidate.   #T2DM: Found to be hyperglycemic during admission. Previously on insulin but based on diagnosis and stage on disease, will start PO medication (amaryl).  This may be titrated up with  addition of other PO medications.    #hx of HTN: currently not taking any medication. Normotensive at this time.   #Alcohol abuse: Admits to drinking more (1/2 gallon lasts three days). He  drinks because he is by himself at home and thinks about his prognosis. Needs continue encouragement to quit.   #Tobacco abuse: currently an 1/2 PPD smoker and received nicotine patch during admission.   Goals of Care:  1. Code Status: FULL  2. Scope of Treatment: Continue all available medical treatments. No limitations to care at this point.  4. Disposition: Considering physical rehab with goal to get back home.  3. Symptom Management:  1. Anxiety/Agitation: Ativan prn per CIWA protocol.  2. Pain: Gabapentin 100 mg tablet BID. Consider increasing after 2-3 days if needed.  3. Bowel Regimen: Colace scheduled.  4. Sleep: Ambien qhs prn.  5. Nausea/Vomiting: Ondansetron prn.    Issues for Follow Up:  1. Dm2: started amaryl 2 mg during admission. Sugars elevated. May need to titrate up and possibly add another oral medication. Avoid insulin based on his lack of nutrition and EtOH intake.  2. HCPOA: if established and readdressing his code status in the future  3. EtOH abuse: drinking increase prior to admission. Need to encourage to not drink. His weakness and balance issues resolved during admission.   Significant Procedures: none   Significant Labs and Imaging:   Recent Labs Lab 01/10/14 2100 01/11/14 0730  WBC 2.0* 2.6*  HGB 11.9* 11.9*  HCT 32.2* 31.5*  PLT 54* 54*    Recent Labs Lab 01/10/14 2100 01/11/14 0730  NA 138 138  K 3.5* 3.9  CL 91* 94*  CO2 28 28  GLUCOSE 318* 250*  BUN 14 15  CREATININE 0.80 0.63  CALCIUM 9.0 8.6  MG 1.6  --   ALKPHOS 115  --   AST 59*  --   ALT 31  --   ALBUMIN 3.8  --     Recent Labs Lab 01/11/14 1702 01/11/14 2102 01/12/14 0859 01/12/14 1236 01/13/14 0945  GLUCAP 250* 355* 287* 450* 358*   CT head: No acute intracranial process.  Stable appearance of the head: Status post left parietoccipital  craniotomy for resection of occipital lobe metastasis.  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   Results/Tests Pending at Time of  Discharge: none  Discharge Medications:    Medication List    ASK your doctor about these medications       docusate sodium 100 MG capsule  Commonly known as:  COLACE  Take 100 mg by mouth 2 (two) times daily.     guaiFENesin 600 MG 12 hr tablet  Commonly known as:  MUCINEX  Take by mouth 2 (two) times daily.     levETIRAcetam 500 MG tablet  Commonly known as:  KEPPRA  Take 500 mg by mouth 2 (two) times daily. One half tablet twice a day for 2 weeks, then take one tablet twice a day     loratadine 10 MG tablet  Commonly known as:  CLARITIN  Take 1 tablet (10 mg total) by mouth daily as needed for allergies.     magnesium oxide 400 (241.3 MG) MG tablet  Commonly known as:  MAG-OX  Take 1 tablet (400 mg total) by mouth 2 (two) times daily.     omeprazole 20 MG capsule  Commonly known as:  PRILOSEC  Take 20 mg by mouth daily.     ondansetron 8 MG tablet  Commonly known as:  ZOFRAN  Take 1 tablet (8 mg total) by mouth every 8 (eight) hours as needed for nausea or vomiting (start on the 3rd day after chemotherapy.).        Discharge Instructions: Please refer to Patient Instructions section of EMR for full details.  Patient was counseled important signs and symptoms that should prompt return to medical care, changes in medications, dietary instructions, activity restrictions, and follow up appointments.   Follow-Up Appointments: Follow-up Information   Follow up with [name], DO On 01/21/2014. (3:00)    Specialty:  Osteopathic Medicine   Contact information:   [place] [address] [phone]       Rosemarie Ax, MD 01/13/2014, 11:43 AM PGY-1, [place]

## 2014-01-13 NOTE — Progress Notes (Addendum)
Physical Therapy Treatment Patient Details Name: William Townsend MRN: [id number] DOB: 1961-08-24 Today's Date: 01/13/2014    History of Present Illness William Townsend is a 53 y.o. male presenting with weakness and ataxia . 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 He also has a history of multiple CVAs, tobacco abuse, T2DM and HTN not currently being treated    PT Comments    Pt agreeable to participate in therapy.  Moving well.  Started off ambulating with use of RW but then he requested to attempt without AD.  No increased assistance needed to ambulate without AD.  Spoke with Pt Re: d/c plans and pt now stating he wants to return home even though he does not have the recommended 24 hr assist/supervision.  Pt will need HHPT if he returns home alone.   Cont with current POC to increase strength, balance, & safety with mobility.      Follow Up Recommendations  HHPT (pt refusing SNF)     Equipment Recommendations  Rolling walker with 5" wheels    Recommendations for Other Services       Precautions / Restrictions Precautions Precautions: Fall Restrictions Weight Bearing Restrictions: No    Mobility  Bed Mobility Overal bed mobility: Modified Independent                Transfers Overall transfer level: Modified independent Equipment used: Rolling walker (2 wheeled) Transfers: Sit to/from Stand              Ambulation/Gait Ambulation/Gait assistance: Min guard Ambulation Distance (Feet): 400 Feet Assistive device: Rolling walker (2 wheeled) Gait Pattern/deviations: Step-through pattern;Wide base of support;Decreased stride length Gait velocity: decreased   General Gait Details: Pt ambulating with use of RW & then requested to attempt without AD.  No increased assistance needed to ambulate  without AD.  Pt able to perform gait challenges such as head turns in all directions, gait speed changes, directional changes, negotiating safely between 2 objects.     Stairs            Wheelchair Mobility    Modified Rankin (Stroke Patients Only)       Balance           Standing balance support: No upper extremity supported Standing balance-Leahy Scale: Good Standing balance comment: Performed reaching activities at various heights, crossing midline, picking object up off floor.                      Cognition Arousal/Alertness: Awake/alert Behavior During Therapy: WFL for tasks assessed/performed Overall Cognitive Status: Within Functional Limits for tasks assessed                      Exercises      General Comments        Pertinent Vitals/Pain Reports mild pain on bottom of feet     Home Living                      Prior Function            PT Goals (current goals can now be found in the care plan section) Acute Rehab PT Goals Patient Stated Goal: be able to return home PT Goal Formulation: With patient Time For Goal Achievement: 01/25/14 Potential to Achieve  Goals: Good Progress towards PT goals: Progressing toward goals    Frequency  Min 3X/week    PT Plan Current plan remains appropriate    Co-evaluation             End of Session Equipment Utilized During Treatment: Gait belt Activity Tolerance: Patient limited by fatigue Patient left: with call bell/phone within reach;in chair     Time: 0808-0820 PT Time Calculation (min): 12 min  Charges:  $Gait Training: 8-22 mins                    G Codes:      Sena Hitch 2014-01-23, 12:03 PM  Sarajane Marek, PTA [phone] 01-23-2014

## 2014-01-17 ENCOUNTER — Telehealth: Payer: Self-pay | Admitting: Internal Medicine

## 2014-01-17 NOTE — Telephone Encounter (Signed)
pt called to r/s lab rom 5/14 to 5/15 per pt cannot come 5/14 - d/t per pt.

## 2014-01-17 NOTE — Discharge Summary (Signed)
I agree with the discharge summary as documented.   Kyle Fletke MD  

## 2014-01-20 ENCOUNTER — Ambulatory Visit: Payer: Medicaid Other | Admitting: Radiation Oncology

## 2014-01-20 ENCOUNTER — Other Ambulatory Visit: Payer: Medicaid Other

## 2014-01-20 NOTE — Progress Notes (Signed)
Seen and agree with PTA note. Continue to recommend SNF but in light of pt refusal HHPT recommend for return home should pt continue to refuse SNF and also continue to recommend supervision for all mobility Elwyn Reach, [place]

## 2014-01-21 ENCOUNTER — Ambulatory Visit (HOSPITAL_BASED_OUTPATIENT_CLINIC_OR_DEPARTMENT_OTHER): Payer: Medicaid Other

## 2014-01-21 ENCOUNTER — Ambulatory Visit (INDEPENDENT_AMBULATORY_CARE_PROVIDER_SITE_OTHER): Payer: Medicaid Other | Admitting: Family Medicine

## 2014-01-21 ENCOUNTER — Encounter: Payer: Self-pay | Admitting: Family Medicine

## 2014-01-21 VITALS — BP 145/93 | HR 90 | Wt 162.0 lb

## 2014-01-21 DIAGNOSIS — C3410 Malignant neoplasm of upper lobe, unspecified bronchus or lung: Secondary | ICD-10-CM

## 2014-01-21 DIAGNOSIS — C787 Secondary malignant neoplasm of liver and intrahepatic bile duct: Secondary | ICD-10-CM

## 2014-01-21 DIAGNOSIS — C3490 Malignant neoplasm of unspecified part of unspecified bronchus or lung: Secondary | ICD-10-CM

## 2014-01-21 DIAGNOSIS — F1011 Alcohol abuse, in remission: Secondary | ICD-10-CM

## 2014-01-21 DIAGNOSIS — R531 Weakness: Secondary | ICD-10-CM

## 2014-01-21 DIAGNOSIS — R5383 Other fatigue: Secondary | ICD-10-CM

## 2014-01-21 DIAGNOSIS — R5381 Other malaise: Secondary | ICD-10-CM

## 2014-01-21 DIAGNOSIS — E119 Type 2 diabetes mellitus without complications: Secondary | ICD-10-CM

## 2014-01-21 LAB — COMPREHENSIVE METABOLIC PANEL (CC13)
ALBUMIN: 3.6 g/dL (ref 3.5–5.0)
ALK PHOS: 142 U/L (ref 40–150)
ALT: 40 U/L (ref 0–55)
AST: 37 U/L — ABNORMAL HIGH (ref 5–34)
Anion Gap: 18 mEq/L — ABNORMAL HIGH (ref 3–11)
BILIRUBIN TOTAL: 0.68 mg/dL (ref 0.20–1.20)
BUN: 7.5 mg/dL (ref 7.0–26.0)
CO2: 23 mEq/L (ref 22–29)
Calcium: 10.1 mg/dL (ref 8.4–10.4)
Chloride: 104 mEq/L (ref 98–109)
Creatinine: 0.8 mg/dL (ref 0.7–1.3)
Glucose: 164 mg/dl — ABNORMAL HIGH (ref 70–140)
Potassium: 3.6 mEq/L (ref 3.5–5.1)
SODIUM: 144 meq/L (ref 136–145)
Total Protein: 6.9 g/dL (ref 6.4–8.3)

## 2014-01-21 LAB — CBC WITH DIFFERENTIAL/PLATELET
BASO%: 0.5 % (ref 0.0–2.0)
Basophils Absolute: 0 10*3/uL (ref 0.0–0.1)
EOS ABS: 0 10*3/uL (ref 0.0–0.5)
EOS%: 1.5 % (ref 0.0–7.0)
HCT: 37.2 % — ABNORMAL LOW (ref 38.4–49.9)
HGB: 13.1 g/dL (ref 13.0–17.1)
LYMPH%: 27.2 % (ref 14.0–49.0)
MCH: 37.8 pg — AB (ref 27.2–33.4)
MCHC: 35.2 g/dL (ref 32.0–36.0)
MCV: 107.4 fL — ABNORMAL HIGH (ref 79.3–98.0)
MONO#: 0.3 10*3/uL (ref 0.1–0.9)
MONO%: 12.4 % (ref 0.0–14.0)
NEUT%: 58.4 % (ref 39.0–75.0)
NEUTROS ABS: 1.5 10*3/uL (ref 1.5–6.5)
Platelets: 174 10*3/uL (ref 140–400)
RBC: 3.47 10*6/uL — ABNORMAL LOW (ref 4.20–5.82)
RDW: 14.8 % — AB (ref 11.0–14.6)
WBC: 2.5 10*3/uL — ABNORMAL LOW (ref 4.0–10.3)
lymph#: 0.7 10*3/uL — ABNORMAL LOW (ref 0.9–3.3)

## 2014-01-21 LAB — MAGNESIUM (CC13): Magnesium: 1.5 mg/dl (ref 1.5–2.5)

## 2014-01-21 LAB — POCT GLYCOSYLATED HEMOGLOBIN (HGB A1C): Hemoglobin A1C: 7.3

## 2014-01-21 MED ORDER — THIAMINE HCL 100 MG PO TABS: 100.0000 mg | ORAL_TABLET | Freq: Every day | ORAL | Status: DC

## 2014-01-21 MED ORDER — FOLIC ACID 1 MG PO TABS: 1.0000 mg | ORAL_TABLET | Freq: Every day | ORAL | Status: DC

## 2014-01-21 MED ORDER — GABAPENTIN 100 MG PO CAPS: 100.0000 mg | ORAL_CAPSULE | Freq: Three times a day (TID) | ORAL | Status: DC

## 2014-01-21 MED ORDER — GLIMEPIRIDE 2 MG PO TABS: 2.0000 mg | ORAL_TABLET | Freq: Every day | ORAL | Status: DC

## 2014-01-21 NOTE — Patient Instructions (Signed)
Dear William Townsend, Thank you for coming in to clinic today. It was good to see you again!  Today we discussed your Weakness, Diabetes, and Cancer 1. For your weakness, it sounds like you are doing a great job taking it one day at a time, and improving. 2. For your diabetes, I recommend continuing the same daily Amaryl medication. Please go to your local pharmacy to bring your testing supplies, and they will help you check your blood sugar. Write down your blood sugar readings, and bring them to your next visit, we can discuss future plans. 3. Continue following up with [facility]. Keep [place] posted on updates. 4. I'm very excited to hear that you have cut back on your alcohol intake, I believe that this will help your strength , especially with your nutrition intake, as alcohol can reduce the amount of nutrition your body absorbs. Encourage continuing to cut back on your alcohol intake.  - Please call if you need any refills on any medications  Some important numbers from today's visit: HgA1C - 7.3 (very good!) BP - 145/93  Please schedule a follow-up appointment with me in 1 to 2 months for Diabetes check-up.  If you have any other questions or concerns, please feel free to call the clinic to contact me. You may also schedule an earlier appointment if necessary.  However, if your symptoms get significantly worse, please go to the Emergency Department to seek immediate medical attention.  William Townsend, William Townsend

## 2014-01-21 NOTE — Assessment & Plan Note (Signed)
Significant reduction in EtOH consumption - Liquor only (still daily drinks 1-2x), previously half gallon q 3 days  Plan: 1. Continue to cut back on EtOH, reduce day-time drinking, especially when alone 2. Continue thiamine, folic acid supplements 3. Improved diet, x 3 daily meals, + ensure

## 2014-01-21 NOTE — Assessment & Plan Note (Signed)
Continue to follow with Dr. Julien Nordmann for Palliative chemotherapy

## 2014-01-21 NOTE — Assessment & Plan Note (Signed)
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 Declined HHRN/PT, instead has family support for assistance. - Authorized for 30 day stay at [facility] (declined on discharge)  Plan: 1. Increased Gabapentin 100mg  TID 2. Continue improved diet, supplements 3. Exercise / strengthening 4. Advised to continue with regular family support at home, if needed may request Home Health arrangements, or future assisted living.

## 2014-01-21 NOTE — Progress Notes (Signed)
Subjective:     Patient ID: William Townsend, male   DOB: 07-Jun-1961, 53 y.o.   MRN: [id number]  Patient presents for a hospital follow-up appointment.  HPI  WEAKNESS: - On discharge from hospital, states declining [facility] / HHPT on discharge from hospital. He states that his ex-sister in law previously worked as Emergency planning/management officer, and would be available to assist whenever needed. Has family support for weekly home activities. Also, reports that he was "authorized" for [facility] [place] [facility] for 30 days (after discharge), but does not think that he needs it. - Currently he reports feeling a little "wobbly" Thursday after discharge, however since then he has been doing really well. - Tolerating yardwork at home, remains active and working on increasing daily walking for activity. Admits to feeling some "muscle soreness" after increased activity. - Complains about increased difficulty with balance in darkness (with lights off at home, now using night-light and flash night) - Denies any falls. Cautious about terrain and surroundings. - Admits eating regular 3x meals daily (cooking more), improved appetite, now drinking daily Ensure  CHRONIC DM, Type 2: Reports - Difficulty with lancets for checking CBG, forgot to bring testing supplies to apt today Meds: Amaryl 2mg  daily Reports good compliance. Tolerating well w/o side-effects Currently not on ACEi / ARB Denies hypoglycemia, polyuria, visual changes, numbness or tingling.  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 - Last appointment at [facility] center (this morning, 01/21/14) for labwork - Scheduled for next palliative chemotherapy (Cisplatin) next Thursday (01/27/14), concerned about prior additives to chemo, questioning if this is what caused a reaction at last  chemotherapy  Substance Abuse: (Tobacco, EtOH) - Admits significant reduction in EtOH consumption, currently with daily drinking (Liquor only) 1-2 drinks daily - Previously was drinking half gallon in 2-3 days (drinking during day and night), admitted to being "bored" - Plans to continue to cut back EtOH use - Denies any suicidal or homicidal ideation.  I have reviewed and updated the following as appropriate: allergies and current medications  Social Hx: - Active smoker 1/2 ppd - Currently living with x 2 roommates - Family support with daughter, and ex-sister in law  Review of Systems  See above HPI.     Objective:   Physical Exam  BP 145/93  Pulse 90  Wt 162 lb (73.483 kg)  Gen - thin and chronically ill, but well-appearing, pleasant and conversational, NAD HEENT - NCAT, PERRL, EOMI, MMM Heart - RRR, no murmurs heard Lungs - CTAB, no wheezing, crackles, or rhonchi. Normal work of breathing. Abd - soft, NTND, no masses, +active BS Ext - non-tender, no edema, peripheral pulses intact +2 b/l Skin - warm, dry, no rashes Neuro - awake, alert, oriented, grossly non-focal, intact muscle strength 5/5 b/l, intact distal sensation to light touch, gait normal     Assessment:     See specific A&P problem list for details.      Plan:     See specific A&P problem list for details.

## 2014-01-21 NOTE — Assessment & Plan Note (Signed)
Stable control Current HgbA1c 7.3 (01/21/14), last HgbA1c 8.7 (04/2013). - Complicated by peripheral neuropathy (bilateral LE)  Plan:  1. Continue Amaryl 2mg  daily 2. Increase Gabapentin to 100mg  TID (may further increase based on symptoms) 3. Pt without recent CBG log, difficulty with testing supplies, but did not bring today. Advised to bring supplies to pharmacy to ask for assistance. Record CBGs fasting AM, 2hr PP 1-3x daily and bring record to next visit. 4. Lifestyle Mods - Continue regular meals x 3 daily, exercise with walking 5. RTC 1 month for CBG monitoring, consider adding Lisinopril if BP elevated (and renal protection), if stable, extend visits to q 3 months for A1c

## 2014-01-25 NOTE — Consult Note (Signed)
I have reviewed and discussed the care of this patient in detail with the nurse practitioner including pertinent patient records, physical exam findings and data. I agree with details of this encounter.  

## 2014-01-26 ENCOUNTER — Other Ambulatory Visit: Payer: Self-pay | Admitting: Internal Medicine

## 2014-01-27 ENCOUNTER — Telehealth: Payer: Self-pay | Admitting: Radiation Oncology

## 2014-01-27 ENCOUNTER — Ambulatory Visit: Payer: Medicaid Other

## 2014-01-27 ENCOUNTER — Other Ambulatory Visit: Payer: Medicaid Other

## 2014-01-27 ENCOUNTER — Ambulatory Visit: Payer: Medicaid Other | Admitting: Physician Assistant

## 2014-01-27 NOTE — Telephone Encounter (Signed)
Patient released from the hospital on 01/13/2014. Phoned patient's home to assess status. No answer. Left message requesting return call.

## 2014-02-01 ENCOUNTER — Other Ambulatory Visit (HOSPITAL_COMMUNITY): Payer: Self-pay | Admitting: Family Medicine

## 2014-02-02 ENCOUNTER — Other Ambulatory Visit: Payer: Self-pay | Admitting: *Deleted

## 2014-02-02 DIAGNOSIS — E119 Type 2 diabetes mellitus without complications: Secondary | ICD-10-CM

## 2014-02-03 ENCOUNTER — Telehealth: Payer: Self-pay | Admitting: *Deleted

## 2014-02-03 ENCOUNTER — Other Ambulatory Visit: Payer: Self-pay | Admitting: *Deleted

## 2014-02-03 ENCOUNTER — Ambulatory Visit: Payer: Medicaid Other

## 2014-02-03 ENCOUNTER — Other Ambulatory Visit: Payer: Medicaid Other

## 2014-02-03 DIAGNOSIS — E119 Type 2 diabetes mellitus without complications: Secondary | ICD-10-CM

## 2014-02-03 MED ORDER — GABAPENTIN 100 MG PO CAPS
100.0000 mg | ORAL_CAPSULE | Freq: Three times a day (TID) | ORAL | Status: DC
Start: ? — End: 2014-02-03

## 2014-02-03 MED ORDER — GABAPENTIN 100 MG PO CAPS: 100.0000 mg | ORAL_CAPSULE | Freq: Three times a day (TID) | ORAL | Status: DC

## 2014-02-08 ENCOUNTER — Telehealth: Payer: Self-pay | Admitting: Radiation Oncology

## 2014-02-08 NOTE — Telephone Encounter (Signed)
Unable to reach patient phoned his daughter, Joseph Art, to assess status. She reports that her father is doing well other than feeling fatigued. She states, "I don't believe he wants to pursue anything further but, I need to confirm." She goes on to explain her father is coming to visit her on Friday in [place]. She plans to have a face to face discussion with her father then and call our office back with an update. She expressed appreciation for the call. Provided her with this writer's contact number.

## 2014-02-09 ENCOUNTER — Emergency Department (HOSPITAL_COMMUNITY): Payer: Medicaid Other

## 2014-02-09 ENCOUNTER — Encounter (HOSPITAL_COMMUNITY): Payer: Self-pay | Admitting: Emergency Medicine

## 2014-02-09 ENCOUNTER — Inpatient Hospital Stay (HOSPITAL_COMMUNITY)
Admission: EM | Admit: 2014-02-09 | Discharge: 2014-02-11 | DRG: 194 | Disposition: A | Discharge status: Home / Self Care | Payer: Medicaid Other | Attending: Family Medicine | Admitting: Family Medicine

## 2014-02-09 DIAGNOSIS — Z888 Allergy status to other drugs, medicaments and biological substances status: Secondary | ICD-10-CM

## 2014-02-09 DIAGNOSIS — R519 Headache, unspecified: Secondary | ICD-10-CM

## 2014-02-09 DIAGNOSIS — Z515 Encounter for palliative care: Secondary | ICD-10-CM

## 2014-02-09 DIAGNOSIS — F17200 Nicotine dependence, unspecified, uncomplicated: Secondary | ICD-10-CM | POA: Diagnosis present

## 2014-02-09 DIAGNOSIS — E43 Unspecified severe protein-calorie malnutrition: Secondary | ICD-10-CM

## 2014-02-09 DIAGNOSIS — R55 Syncope and collapse: Secondary | ICD-10-CM | POA: Diagnosis present

## 2014-02-09 DIAGNOSIS — Z833 Family history of diabetes mellitus: Secondary | ICD-10-CM

## 2014-02-09 DIAGNOSIS — G9389 Other specified disorders of brain: Secondary | ICD-10-CM

## 2014-02-09 DIAGNOSIS — Z9221 Personal history of antineoplastic chemotherapy: Secondary | ICD-10-CM

## 2014-02-09 DIAGNOSIS — T451X5A Adverse effect of antineoplastic and immunosuppressive drugs, initial encounter: Secondary | ICD-10-CM | POA: Diagnosis present

## 2014-02-09 DIAGNOSIS — F1020 Alcohol dependence, uncomplicated: Secondary | ICD-10-CM | POA: Diagnosis present

## 2014-02-09 DIAGNOSIS — D708 Other neutropenia: Secondary | ICD-10-CM | POA: Diagnosis present

## 2014-02-09 DIAGNOSIS — Z9181 History of falling: Secondary | ICD-10-CM

## 2014-02-09 DIAGNOSIS — E119 Type 2 diabetes mellitus without complications: Secondary | ICD-10-CM

## 2014-02-09 DIAGNOSIS — K709 Alcoholic liver disease, unspecified: Secondary | ICD-10-CM | POA: Diagnosis present

## 2014-02-09 DIAGNOSIS — C099 Malignant neoplasm of tonsil, unspecified: Secondary | ICD-10-CM | POA: Diagnosis present

## 2014-02-09 DIAGNOSIS — C7949 Secondary malignant neoplasm of other parts of nervous system: Secondary | ICD-10-CM

## 2014-02-09 DIAGNOSIS — Z823 Family history of stroke: Secondary | ICD-10-CM

## 2014-02-09 DIAGNOSIS — Z85819 Personal history of malignant neoplasm of unspecified site of lip, oral cavity, and pharynx: Secondary | ICD-10-CM

## 2014-02-09 DIAGNOSIS — J189 Pneumonia, unspecified organism: Principal | ICD-10-CM

## 2014-02-09 DIAGNOSIS — R279 Unspecified lack of coordination: Secondary | ICD-10-CM | POA: Diagnosis present

## 2014-02-09 DIAGNOSIS — F411 Generalized anxiety disorder: Secondary | ICD-10-CM | POA: Diagnosis present

## 2014-02-09 DIAGNOSIS — E876 Hypokalemia: Secondary | ICD-10-CM | POA: Diagnosis present

## 2014-02-09 DIAGNOSIS — C787 Secondary malignant neoplasm of liver and intrahepatic bile duct: Secondary | ICD-10-CM | POA: Diagnosis present

## 2014-02-09 DIAGNOSIS — Z902 Acquired absence of lung [part of]: Secondary | ICD-10-CM

## 2014-02-09 DIAGNOSIS — D6959 Other secondary thrombocytopenia: Secondary | ICD-10-CM | POA: Diagnosis present

## 2014-02-09 DIAGNOSIS — Z85118 Personal history of other malignant neoplasm of bronchus and lung: Secondary | ICD-10-CM

## 2014-02-09 DIAGNOSIS — Z79899 Other long term (current) drug therapy: Secondary | ICD-10-CM

## 2014-02-09 DIAGNOSIS — C3490 Malignant neoplasm of unspecified part of unspecified bronchus or lung: Secondary | ICD-10-CM

## 2014-02-09 DIAGNOSIS — IMO0002 Reserved for concepts with insufficient information to code with codable children: Secondary | ICD-10-CM | POA: Diagnosis present

## 2014-02-09 DIAGNOSIS — R748 Abnormal levels of other serum enzymes: Secondary | ICD-10-CM | POA: Diagnosis present

## 2014-02-09 DIAGNOSIS — K117 Disturbances of salivary secretion: Secondary | ICD-10-CM | POA: Diagnosis present

## 2014-02-09 DIAGNOSIS — R000 Tachycardia, unspecified: Secondary | ICD-10-CM | POA: Diagnosis present

## 2014-02-09 DIAGNOSIS — I10 Essential (primary) hypertension: Secondary | ICD-10-CM

## 2014-02-09 DIAGNOSIS — Z825 Family history of asthma and other chronic lower respiratory diseases: Secondary | ICD-10-CM

## 2014-02-09 DIAGNOSIS — R531 Weakness: Secondary | ICD-10-CM

## 2014-02-09 DIAGNOSIS — G609 Hereditary and idiopathic neuropathy, unspecified: Secondary | ICD-10-CM | POA: Diagnosis present

## 2014-02-09 DIAGNOSIS — F1011 Alcohol abuse, in remission: Secondary | ICD-10-CM

## 2014-02-09 DIAGNOSIS — I4891 Unspecified atrial fibrillation: Secondary | ICD-10-CM

## 2014-02-09 DIAGNOSIS — T50995A Adverse effect of other drugs, medicaments and biological substances, initial encounter: Secondary | ICD-10-CM | POA: Diagnosis present

## 2014-02-09 DIAGNOSIS — Z66 Do not resuscitate: Secondary | ICD-10-CM | POA: Diagnosis not present

## 2014-02-09 DIAGNOSIS — J309 Allergic rhinitis, unspecified: Secondary | ICD-10-CM

## 2014-02-09 DIAGNOSIS — R51 Headache: Secondary | ICD-10-CM

## 2014-02-09 DIAGNOSIS — Z818 Family history of other mental and behavioral disorders: Secondary | ICD-10-CM

## 2014-02-09 DIAGNOSIS — Z8673 Personal history of transient ischemic attack (TIA), and cerebral infarction without residual deficits: Secondary | ICD-10-CM

## 2014-02-09 DIAGNOSIS — Z923 Personal history of irradiation: Secondary | ICD-10-CM

## 2014-02-09 DIAGNOSIS — E860 Dehydration: Secondary | ICD-10-CM | POA: Diagnosis present

## 2014-02-09 DIAGNOSIS — Z8249 Family history of ischemic heart disease and other diseases of the circulatory system: Secondary | ICD-10-CM

## 2014-02-09 DIAGNOSIS — G8929 Other chronic pain: Secondary | ICD-10-CM | POA: Diagnosis present

## 2014-02-09 DIAGNOSIS — C7931 Secondary malignant neoplasm of brain: Secondary | ICD-10-CM | POA: Diagnosis present

## 2014-02-09 LAB — CBC WITH DIFFERENTIAL/PLATELET
Basophils Absolute: 0 10*3/uL (ref 0.0–0.1)
Basophils Relative: 0 % (ref 0–1)
Eosinophils Absolute: 0 10*3/uL (ref 0.0–0.7)
Eosinophils Relative: 0 % (ref 0–5)
HCT: 36.7 % — ABNORMAL LOW (ref 39.0–52.0)
Hemoglobin: 13.4 g/dL (ref 13.0–17.0)
LYMPHS ABS: 0.2 10*3/uL — AB (ref 0.7–4.0)
Lymphocytes Relative: 4 % — ABNORMAL LOW (ref 12–46)
MCH: 37.3 pg — ABNORMAL HIGH (ref 26.0–34.0)
MCHC: 36.5 g/dL — ABNORMAL HIGH (ref 30.0–36.0)
MCV: 102.2 fL — ABNORMAL HIGH (ref 78.0–100.0)
Monocytes Absolute: 0.4 10*3/uL (ref 0.1–1.0)
Monocytes Relative: 7 % (ref 3–12)
NEUTROS ABS: 4.9 10*3/uL (ref 1.7–7.7)
NEUTROS PCT: 89 % — AB (ref 43–77)
Platelets: 74 10*3/uL — ABNORMAL LOW (ref 150–400)
RBC: 3.59 MIL/uL — AB (ref 4.22–5.81)
RDW: 13.9 % (ref 11.5–15.5)
WBC: 5.5 10*3/uL (ref 4.0–10.5)

## 2014-02-09 LAB — COMPREHENSIVE METABOLIC PANEL
ALK PHOS: 173 U/L — AB (ref 39–117)
ALT: 27 U/L (ref 0–53)
AST: 61 U/L — ABNORMAL HIGH (ref 0–37)
Albumin: 3.7 g/dL (ref 3.5–5.2)
BUN: 10 mg/dL (ref 6–23)
CO2: 25 mEq/L (ref 19–32)
Calcium: 8.7 mg/dL (ref 8.4–10.5)
Chloride: 95 mEq/L — ABNORMAL LOW (ref 96–112)
Creatinine, Ser: 0.65 mg/dL (ref 0.50–1.35)
GFR calc non Af Amer: >90 mL/min (ref >90)
GLUCOSE: 267 mg/dL — AB (ref 70–99)
POTASSIUM: 3.5 meq/L — AB (ref 3.7–5.3)
SODIUM: 141 meq/L (ref 137–147)
Total Bilirubin: 2.6 mg/dL — ABNORMAL HIGH (ref 0.3–1.2)
Total Protein: 6.9 g/dL (ref 6.0–8.3)

## 2014-02-09 LAB — URINALYSIS, ROUTINE W REFLEX MICROSCOPIC
Hgb urine dipstick: NEGATIVE
Ketones, ur: 40 mg/dL — AB
Leukocytes, UA: NEGATIVE
Nitrite: NEGATIVE
PH: 7 (ref 5.0–8.0)
PROTEIN: NEGATIVE mg/dL
Specific Gravity, Urine: 1.029 (ref 1.005–1.030)
Urobilinogen, UA: 2 mg/dL — ABNORMAL HIGH (ref 0.0–1.0)

## 2014-02-09 LAB — URINE MICROSCOPIC-ADD ON

## 2014-02-09 LAB — TROPONIN I: Troponin I: <0.3 ng/mL (ref <0.30)

## 2014-02-09 LAB — LIPASE, BLOOD: Lipase: 23 U/L (ref 11–59)

## 2014-02-09 MED ORDER — IOHEXOL 300 MG/ML  SOLN
20.0000 mL | INTRAMUSCULAR | Status: AC
  Administered 2014-02-09: 20 mL via ORAL

## 2014-02-09 MED ORDER — SODIUM CHLORIDE 0.9 % IV BOLUS (SEPSIS)
1000.0000 mL | INTRAVENOUS | Status: AC
  Administered 2014-02-09: 1000 mL via INTRAVENOUS

## 2014-02-09 MED ORDER — DEXTROSE 5 % IV SOLN
2.0000 g | Freq: Two times a day (BID) | INTRAVENOUS | Status: DC
  Administered 2014-02-09 – 2014-02-10 (×3): 2 g via INTRAVENOUS
  Filled 2014-02-09 (×5): qty 2

## 2014-02-09 MED ORDER — DIPHENHYDRAMINE HCL 50 MG/ML IJ SOLN
12.5000 mg | Freq: Once | INTRAMUSCULAR | Status: AC
  Administered 2014-02-09: 12.5 mg via INTRAVENOUS
  Filled 2014-02-09: qty 1

## 2014-02-09 MED ORDER — SODIUM CHLORIDE 0.9 % IV BOLUS (SEPSIS)
1000.0000 mL | Freq: Once | INTRAVENOUS | Status: AC
  Administered 2014-02-09: 1000 mL via INTRAVENOUS

## 2014-02-09 MED ORDER — VANCOMYCIN HCL IN DEXTROSE 1-5 GM/200ML-% IV SOLN
1000.0000 mg | Freq: Once | INTRAVENOUS | Status: AC
  Administered 2014-02-10: 1000 mg via INTRAVENOUS
  Filled 2014-02-09: qty 200

## 2014-02-09 MED ORDER — SODIUM CHLORIDE 0.9 % IV SOLN
1000.0000 mL | Freq: Once | INTRAVENOUS | Status: AC
  Administered 2014-02-09: 1000 mL via INTRAVENOUS

## 2014-02-09 MED ORDER — IOHEXOL 350 MG/ML SOLN
100.0000 mL | Freq: Once | INTRAVENOUS | Status: AC | PRN
  Administered 2014-02-09: 100 mL via INTRAVENOUS

## 2014-02-09 MED ORDER — LORAZEPAM 2 MG/ML IJ SOLN
0.5000 mg | Freq: Once | INTRAMUSCULAR | Status: AC
  Administered 2014-02-09: 0.5 mg via INTRAVENOUS
  Filled 2014-02-09: qty 1

## 2014-02-09 MED ORDER — ONDANSETRON HCL 4 MG/2ML IJ SOLN
4.0000 mg | Freq: Once | INTRAMUSCULAR | Status: AC
  Administered 2014-02-09: 4 mg via INTRAVENOUS
  Filled 2014-02-09: qty 2

## 2014-02-09 MED ORDER — METOCLOPRAMIDE HCL 5 MG/ML IJ SOLN
5.0000 mg | Freq: Once | INTRAMUSCULAR | Status: AC
  Administered 2014-02-09: 5 mg via INTRAVENOUS
  Filled 2014-02-09: qty 2

## 2014-02-09 NOTE — ED Notes (Signed)
Pt started having nausea/vomiting yesterday- progressively getting worse. Pt visibly shaking. EMS gave 612mL bolus. HR 120's-180's. BP 131/83, HR 177. Respirations 16. Pt complaining of headache. CBG 279. Hx: Pt had brain/liver/lung CA and stopped receiving chemo treatments 2 weeks ago.

## 2014-02-09 NOTE — H&P (Signed)
[facility] Admission History and Physical Service Pager: [phone]  Patient name: William Townsend Medical record number: [id number] Date of birth: 28-Jun-1961 Age: 53 y.o. Gender: male  Primary Care Provider: [name], DO Consultants: none Code Status: Full (confirmed on admission)  Chief Complaint: Cough / SOB, fever/chills, and generalized weakness  Assessment and Plan: William Townsend is a 53 y.o. male presenting with 2 days of productive cough, fever/chills, generalized weakness / dizziness, found to have HCAP on Chest CT . 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  # RML HCAP - Presented with clinical concern for PNA, found RML infiltrate on CXR / Chest CT, considered HCAP with recent admission and current chemotherapy. WBC nml 5.5 (however chronic leukopenia 2/2 chemo). Subjective fevers/chills, afebrile in ED. - Admit to FPTS, telemetry - Titrate O2 PRN - Vancomycin / Cefepime IV for empiric HCAP coverage, pharmacy consulted - f/u Blood cultures drawn in ED prior to abx - incentive spiro - f/u fever curve, Tylenol PRN  # Dehydration / Generalized Weakness / Syncope - Recent h/o multiple pre-syncopal / syncopal episodes. Does endorse dizziness, most recent syncopal episodes with fall today, persistent weakness. Suspect large component due to poor PO and dehydration, +tachy 150s on admission, improved s/p 2L IVF bolus - continue IVF 125 cc/hr - Orthostatic vitals - PT/OT eval  # Nausea / Abdominal Pain - Recent h/o poor PO intake, nausea, generalized abdominal pain. Notable elevated alk phos (chronic), and elevated T.Bili 2.6. Initial concern for possible gallbladder disease with cholecystitis, however afebrile, minimal  RUQ pain - CT abd (no acute findings), stable - Zofran PRN - Protonix PO  #Stage IV small cell lung CA with brain, liver, and spleen mets - Followed by Dr. Julien Nordmann, currently undergoing palliative chemotherapy (last treatment 3 weeks ago?), undecided if wants to continue. Recent poor reaction to chemo. CT chest/abd/pelvis from appt (4/30) showing disease progression the lung as well as in the liver and abdomen. 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  #History of CVA x3 in 2012 - CT head negative for acute findings, stable from prior - s/p brain surgery, poor anti-platelet candidate  #T2DM - found to have hyperglycemia to 267. Last A1c 5/15 with 7.3 - hold home Amaryl 228mdaily - mod SSI - CBG q 4 AC + HS - Gabapentin 10050mID for peripheral neuropathy  #hx of HTN - normotensive - monitor, not on home anti-HTNs  #Alcohol abuse: - Persistent EtOH use, recently increased, drinks only Vodka, admits to half gallon in 4 days, last drink 2 days ago - CIWA protcol - Thiamine 100m40mily - folate - c/s Nutrition re: poor PO intake, EtOH abuse  #Tobacco abuse: - currently an 1/2 PPD smoker  - nicotine patch 14mg39mHypokalemia - K 3.5 - replace with PO 40 mEq - trend bmet  FEN/GI: NPO, advance to clear liquid in AM, NS @ 125 cc/hr  Prophylaxis: SCDs (d/t chronic thrombocytopenia)  [facility], [facility] antibiotics, monitoring, PT/OT eval, dispo pending clinical improvement.  History of Present Illness:   William Townsend 53 y.36 male who reports 3 days of worsening dizziness, decreased PO intake (ensures, soup), nausea / vomiting (x 2 yesterday,  NBNB), woke up Wednesday after got up tried walking then "passed out" and fell, admits to passing out 3x in past 2 days with minor injuries, denies significant head injury, crawled to couch and difficulty moving due to dizziness, called EMS brought to  ED. Additionally, complains of productive cough x 2 days, reports subjective fever with sweats and then chills. Denies chest pain, admits central abdominal pain, pain on bottom of both feet (had been improved on Gabapentin 118m TID, since ran out).  Currently living at home, brother (Eduard Clos at bedside has been staying with him recently. Reports chronic dietary concerns with chronic dry mouth, needs "moist foods". Some persistent difficulty with ambulation, previously declined SNF.  Oncology - Patient unsure if he wants to continue chemo, states that chemo is "starting to hurt", last therapy before prior hospitalization.   Review Of Systems: Per HPI with the following additions: +HA, blurry vision (chronic, worsening) Otherwise 12 point review of systems was performed and was unremarkable.  Patient Active Problem List   Diagnosis Date Noted  . HCAP (healthcare-associated pneumonia) 02/09/2014  . Palliative care encounter 01/12/2014  . Weakness 01/11/2014  . Ataxia 01/11/2014  . Localization-related (focal) (partial) epilepsy and epileptic syndromes with complex partial seizures, without mention of intractable epilepsy 12/02/2013  . Protein-calorie malnutrition, severe 10/08/2013  . Acute encephalopathy 10/08/2013  . Small cell lung cancer 06/01/2013  . Brain mass 04/21/2013  . Lesion of lung 04/21/2013  . Liver lesion 04/21/2013  . Tobacco abuse 04/03/2013  . Headache(784.0) 04/02/2013  . Diabetes mellitus, type 2 01/28/2012  . Essential hypertension, benign 01/28/2012  . History of stroke 01/28/2012  . History of alcohol abuse 01/28/2012  . History of lung cancer 01/28/2012  . History of throat cancer 01/28/2012  . Allergic rhinitis 01/28/2012  . Chronic pain of multiple joints 01/28/2012   Past Medical History: Past Medical History  Diagnosis Date  . Atrial fibrillation   . Diabetes mellitus   . 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 Allergy   . Substance abuse   . Depression    . Stroke     3 previous strokes - Oct, Nov, and Dec 2012  . Throat cancer     September 2008  . Lung cancer     lobectomy in 2010  . Heart murmur   . Hx of radiation therapy 05/18/13- 06/01/13    whole brain, 30 Gy, 10 fractions   Past Surgical History: Past Surgical History  Procedure Laterality Date  . Lobectomy    . Craniotomy Left 04/27/2013    Procedure: Left Occipital Craniotomy for resection of tumor with microscope;  Surgeon: EFloyce Stakes MD;  [facility];  Service: Neurosurgery;  Laterality: Left;   Social History: History  Substance Use Topics  . Smoking status: Current Some Day Smoker -- 0.50 packs/day for 30 years    Types: Cigarettes  . Smokeless tobacco: Never Used  . Alcohol Use: 0.0 oz/week    1-2 Cans of beer per week     Comment: couple beers at night   Additional social history:  EtOH half gallon vodka in 4 days, last drink Tuesday.   Please also refer to relevant sections of EMR.  Family History: Family History  Problem Relation Age of Onset  . Diabetes Mother   . Early death Mother   . Heart disease Father   . Stroke Father   . Asthma Sister   . Depression Sister   . Seizures Neg Hx  Allergies and Medications: Allergies  Allergen Reactions  . Vicodin [Hydrocodone-Acetaminophen] Itching   Current Facility-Administered Medications on File Prior to Encounter  Medication Dose Route Frequency Provider Last Rate Last Dose  . heparin lock flush 100 unit/mL  500 Units Intravenous Once Curt Bears, MD      . sodium chloride 0.9 % injection 10 mL  10 mL Intravenous PRN Curt Bears, MD   10 mL at 01/06/14 0830   Current Outpatient Prescriptions on File Prior to Encounter  Medication Sig Dispense Refill  . docusate sodium (COLACE) 100 MG capsule Take 100 mg by mouth 2 (two) times daily.      . folic acid (FOLVITE) 1 MG tablet Take 1 tablet (1 mg total) by mouth daily.  30 tablet  2  . gabapentin (NEURONTIN) 100 MG capsule Take  1 capsule (100 mg total) by mouth 3 (three) times daily.  90 capsule  2  . glimepiride (AMARYL) 2 MG tablet Take 1 tablet (2 mg total) by mouth daily with breakfast.  30 tablet  0  . guaiFENesin (MUCINEX) 600 MG 12 hr tablet Take by mouth 2 (two) times daily.      William Kitchen levETIRAcetam (KEPPRA) 500 MG tablet Take 500 mg by mouth 2 (two) times daily. One half tablet twice a day for 2 weeks, then take one tablet twice a day      . loratadine (CLARITIN) 10 MG tablet Take 1 tablet (10 mg total) by mouth daily as needed for allergies.  30 tablet  0  . magnesium oxide (MAG-OX) 400 (241.3 MG) MG tablet Take 1 tablet (400 mg total) by mouth 2 (two) times daily.  60 tablet  0  . omeprazole (PRILOSEC) 20 MG capsule Take 20 mg by mouth daily.      . ondansetron (ZOFRAN) 8 MG tablet Take 1 tablet (8 mg total) by mouth every 8 (eight) hours as needed for nausea or vomiting (start on the 3rd day after chemotherapy.).  30 tablet  1  . thiamine 100 MG tablet Take 1 tablet (100 mg total) by mouth daily.  30 tablet  2  . Blood Glucose Monitoring Suppl (ACCU-CHEK NANO SMARTVIEW) W/DEVICE KIT Measure blood sugar once daily prior to taking amaryl in the morning.  1 kit  0  . feeding supplement, ENSURE COMPLETE, (ENSURE COMPLETE) LIQD Take 237 mLs by mouth 2 (two) times daily between meals.  30 Bottle  0  . glucose blood (ACCU-CHEK ACTIVE STRIPS) test strip Use as instructed  100 each  12  . Lancets (ACCU-CHEK MULTICLIX) lancets Use as instructed  100 each  12    Objective: BP 150/75  Pulse 111  Temp(Src) 98.4 F (36.9 C) (Oral)  Resp 19  SpO2 98% Exam: General: thin, pleasant, cooperative, tremulous, NAD  HEENT: NCAT, PERRL, EOMI, oropharynx clear, dry MM, poor dentition Cardiovascular: RRR, no murmurs  Respiratory: bilateral coarse breath sounds with R>L, bibasilar crackles and diminished sounds, no increased work of breathing.  Abdomen: soft, mild tenderness epigastric region, otherwise no rebound, no guarding,  neg Murphy's, neg McBurneys, +active BS Extremities: non-tender, no edema, +2 peripheral pulses Skin: warm, dry, no rashes Neuro: awake, alert, and oriented, CN2-12 grossly intact, non-focal, b/l UE with 4/5 strength, lower ext 5/5  Labs and Imaging: CBC BMET   Recent Labs Lab 02/09/14 1900  WBC 5.5  HGB 13.4  HCT 36.7*  PLT 74*    Recent Labs Lab 02/09/14 1900  NA 141  K 3.5*  CL 95*  CO2 25  BUN 10  CREATININE 0.65  GLUCOSE 267*  CALCIUM 8.7     Trop (negative) Lipase - 23 Elevated T.Bili - 2.6  UA - spec grav 1.029, ketone 40, neg nit, neg LE, 0-2 WBC  6/3 Blood Culture x 2 (pending)  6/3 CXR 2v IMPRESSION:  1. A left upper lobe infiltrate or cavitary mass is suspected. This  may reflect infectious or neoplastic processes. A patchy infiltrate  in the right infrahilar region is present.  2. There is no evidence of CHF nor pleural effusion.  3. Chest CT scanning is recommended.  6/3 Head CT w/o contrast IMPRESSION:  No acute finding. Stable compared to prior exam.  Status post left occipital lobe craniotomy for resection of a left  occipital lobe metastasis.  Colloid cyst, unchanged.  6/3 CT Chest IMPRESSION:  1. No evidence of a pulmonary embolus.  2. New area of peribronchovascular opacity in the right middle lobe.  Although this could reflect metastatic disease, the pattern is more  suggestive of infection or inflammation.  3. Focal masslike opacity in the left upper lobe is stable from the  prior CT. Several tiny lung nodules are stable. One nodule measuring  3 mm in the right lower lobe may be new, although the difference  between the current the prior study could be due to slice selection  differences.  4. Posterior inferior right periaortic mediastinal lymph node has  mildly increased in size from the prior study.  5. Liver metastatic disease appears stable, not completely evaluated  on this exam.  6/4 CT Abd / Pelvis IMPRESSION:  1. No  acute intra-abdominal findings.  2. Right middle lobe pneumonia.  3. Known hepatic and nodal metastatic disease.  [name], DO 02/09/2014, 10:53 PM PGY-1, [place] [facility] pager: [phone], text pages welcome   PGY-3 Addendum: I have seen and examined patient, I agree with the excellent H&P written by Dr. Parks Ranger. Will start on antibiotics for HCAP and monitor clinical result. Patient did not have any further questions. Korah Hufstedler M. Kaidon Kinker, M.D. 02/10/2014 6:02 AM

## 2014-02-09 NOTE — ED Notes (Signed)
Dr Harrison at bedside

## 2014-02-09 NOTE — ED Notes (Addendum)
Admitting physician at bedside. Pt still in room A-3.

## 2014-02-09 NOTE — ED Provider Notes (Signed)
CSN: [id number]     Arrival date & time 02/09/14  1828 History   First MD Initiated Contact with Patient 02/09/14 1847     Chief Complaint  Patient presents with  . Nausea  . Vomiting  . Tachycardia     (Consider location/radiation/quality/duration/timing/severity/associated sxs/prior Treatment) Patient is a 53 y.o. male presenting with vomiting and headaches. The history is provided by the patient.  Emesis Severity:  Mild Duration:  2 days Timing:  Constant Quality:  Stomach contents Able to tolerate:  Liquids Progression:  Unchanged Chronicity:  New Recent urination:  Normal Relieved by:  Nothing Worsened by:  Nothing tried Ineffective treatments:  None tried Associated symptoms: headaches   Associated symptoms: no abdominal pain and no diarrhea   Headache Pain location:  L temporal and R temporal Quality:  Dull Radiates to:  Does not radiate Severity currently:  6/10 Severity at highest:  8/10 Onset quality:  Gradual Duration:  1 day Timing:  Constant Progression:  Worsening Chronicity:  New Similar to prior headaches: no   Context comment:  At rest Relieved by:  Nothing Worsened by:  Nothing tried Ineffective treatments:  None tried Associated symptoms: cough, nausea and vomiting   Associated symptoms: no abdominal pain, no diarrhea, no pain, no fever, no neck pain and no numbness     Past Medical History  Diagnosis Date  . Atrial fibrillation   . Diabetes mellitus   . 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 Allergy   . Substance abuse   . Depression   . Stroke     3 previous strokes - Oct, Nov, and Dec 2012  . Throat cancer     September 2008  . Lung cancer     lobectomy in 2010  . Heart murmur   . Hx of radiation therapy 05/18/13- 06/01/13    whole brain, 30 Gy, 10 fractions   Past Surgical History  Procedure Laterality Date  . Lobectomy    . Craniotomy Left 04/27/2013    Procedure: Left Occipital Craniotomy for resection of tumor with microscope;   Surgeon: Floyce Stakes, MD;  Location: [facility];  Service: Neurosurgery;  Laterality: Left;   Family History  Problem Relation Age of Onset  . Diabetes Mother   . Early death Mother   . Heart disease Father   . Stroke Father   . Asthma Sister   . Depression Sister   . Seizures Neg Hx    History  Substance Use Topics  . Smoking status: Current Some Day Smoker -- 0.50 packs/day for 30 years    Types: Cigarettes  . Smokeless tobacco: Never Used  . Alcohol Use: 0.0 oz/week    1-2 Cans of beer per week     Comment: couple beers at night    Review of Systems  Constitutional: Negative for fever.  HENT: Negative for drooling and rhinorrhea.   Eyes: Negative for pain.  Respiratory: Positive for cough. Negative for shortness of breath.   Cardiovascular: Negative for chest pain and leg swelling.  Gastrointestinal: Positive for nausea and vomiting. Negative for abdominal pain and diarrhea.  Genitourinary: Negative for dysuria and hematuria.  Musculoskeletal: Negative for gait problem and neck pain.  Skin: Negative for color change.  Neurological: Positive for weakness and headaches. Negative for numbness.  Hematological: Negative for adenopathy.  Psychiatric/Behavioral: Negative for behavioral problems.  All other systems reviewed and are negative.     Allergies  Vicodin  Home Medications   Prior to  Admission medications   Medication Sig Start Date End Date Taking? Authorizing Provider  docusate sodium (COLACE) 100 MG capsule Take 100 mg by mouth 2 (two) times daily.   Yes Historical Provider, MD  folic acid (FOLVITE) 1 MG tablet Take 1 tablet (1 mg total) by mouth daily. 01/21/14  Yes Alexander Parks Ranger, DO  gabapentin (NEURONTIN) 100 MG capsule Take 1 capsule (100 mg total) by mouth 3 (three) times daily. 02/03/14  Yes [name], DO  glimepiride (AMARYL) 2 MG tablet Take 1 tablet (2 mg total) by mouth daily with breakfast. 01/21/14  Yes [name], DO  guaiFENesin (MUCINEX) 600 MG 12 hr tablet Take by mouth 2 (two) times daily.   Yes Historical Provider, MD  levETIRAcetam (KEPPRA) 500 MG tablet Take 500 mg by mouth 2 (two) times daily. One half tablet twice a day for 2 weeks, then take one tablet twice a day 12/02/13  Yes Kathrynn Ducking, MD  loratadine (CLARITIN) 10 MG tablet Take 1 tablet (10 mg total) by mouth daily as needed for allergies. 09/01/13  Yes Curt Bears, MD  magnesium oxide (MAG-OX) 400 (241.3 MG) MG tablet Take 1 tablet (400 mg total) by mouth 2 (two) times daily. 01/06/14  Yes Curt Bears, MD  omeprazole (PRILOSEC) 20 MG capsule Take 20 mg by mouth daily.   Yes Historical Provider, MD  ondansetron (ZOFRAN) 8 MG tablet Take 1 tablet (8 mg total) by mouth every 8 (eight) hours as needed for nausea or vomiting (start on the 3rd day after chemotherapy.). 12/28/13  Yes Curt Bears, MD  thiamine 100 MG tablet Take 1 tablet (100 mg total) by mouth daily. 01/21/14  Yes [name], DO  Blood Glucose Monitoring Suppl (ACCU-CHEK NANO SMARTVIEW) W/DEVICE KIT Measure blood sugar once daily prior to taking amaryl in the morning. 01/13/14   Rosemarie Ax, MD  feeding supplement, ENSURE COMPLETE, (ENSURE COMPLETE) LIQD Take 237 mLs by mouth 2 (two) times daily between meals. 01/13/14   Rosemarie Ax, MD  glucose blood [facility] ACTIVE STRIPS) test strip Use as instructed 01/13/14   Rosemarie Ax, MD  Lancets (ACCU-CHEK MULTICLIX) lancets Use as instructed 01/13/14   Rosemarie Ax, MD   BP 161/103  Temp(Src) 98.4 F (36.9 C) (Oral)  Resp 16 Physical Exam  Nursing note and vitals reviewed. Constitutional: He is oriented to person, place, and time. He appears well-developed and well-nourished.  HENT:  Head: Normocephalic and atraumatic.  Right Ear: External ear normal.  Left Ear: External ear normal.  Nose: Nose normal.  Mouth/Throat: Oropharynx is clear and moist. No oropharyngeal exudate.  Eyes:  Conjunctivae and EOM are normal. Pupils are equal, round, and reactive to light.  Neck: Normal range of motion. Neck supple.  Cardiovascular: Regular rhythm, normal heart sounds and intact distal pulses.  Exam reveals no gallop and no friction rub.   No murmur heard. afib w/ RVR - 140's  Pulmonary/Chest: He has no wheezes.  Mild tachypnea. Mild rhonchi bilaterally.   Abdominal: Soft. Bowel sounds are normal. He exhibits no distension. There is no tenderness. There is no rebound and no guarding.  Musculoskeletal: Normal range of motion. He exhibits no edema and no tenderness.  Neurological: He is alert and oriented to person, place, and time.  alert, oriented x3 speech: normal in context and clarity memory: intact grossly cranial nerves II-XII: intact motor strength: full proximally and distally, tremulous upper extremities sensation: intact to light touch diffusely  cerebellar: finger-to-nose and heel-to-shin intact  gait: deferred as pt feels to weak to stand   Skin: Skin is warm and dry.  Psychiatric: He has a normal mood and affect. His behavior is normal.    ED Course  Procedures (including critical care time) Labs Review Labs Reviewed  CBC WITH DIFFERENTIAL - Abnormal; Notable for the following:    RBC 3.59 (*)    HCT 36.7 (*)    MCV 102.2 (*)    MCH 37.3 (*)    MCHC 36.5 (*)    Platelets 74 (*)    Neutrophils Relative % 89 (*)    Lymphocytes Relative 4 (*)    Lymphs Abs 0.2 (*)    All other components within normal limits  COMPREHENSIVE METABOLIC PANEL - Abnormal; Notable for the following:    Potassium 3.5 (*)    Chloride 95 (*)    Glucose, Bld 267 (*)    AST 61 (*)    Alkaline Phosphatase 173 (*)    Total Bilirubin 2.6 (*)    All other components within normal limits  URINALYSIS, ROUTINE W REFLEX MICROSCOPIC - Abnormal; Notable for the following:    Color, Urine AMBER (*)    APPearance CLOUDY (*)    Glucose, UA >1000 (*)    Bilirubin Urine SMALL (*)     Ketones, ur 40 (*)    Urobilinogen, UA 2.0 (*)    All other components within normal limits  URINE MICROSCOPIC-ADD ON - Abnormal; Notable for the following:    Casts HYALINE CASTS (*)    All other components within normal limits  CULTURE, BLOOD (ROUTINE X 2)  CULTURE, BLOOD (ROUTINE X 2)  LIPASE, BLOOD  TROPONIN I    Imaging Review Dg Chest 2 View  02/09/2014   CLINICAL DATA:  History of lung malignancy status post lobectomy in 2010 now with cough  EXAM: CHEST  2 VIEW  COMPARISON:  CT scan of the chest dated December 17, 2013  FINDINGS: There is an ovoid area of increased density on the left in the posterior inferior aspect of the upper lobe which measures 3.5 x 2.4 x 2.9 cm. A central lucency is present suggesting a cavitary structure. There are coarse right-sided infrahilar lung markings. The cardiac silhouette and pulmonary vascularity within the limits of normal. The power port catheter is in reasonable position with the tip in the mid portion of the SVC. There is tortuosity of the descending thoracic aorta. There is no pleural effusion. The bony thorax exhibits no acute abnormalities.  IMPRESSION: 1. A left upper lobe infiltrate or cavitary mass is suspected. This may reflect infectious or neoplastic processes. A patchy infiltrate in the right infrahilar region is present. 2. There is no evidence of CHF nor pleural effusion. 3. Chest CT scanning is recommended.   Electronically Signed   By: David  Martinique   On: 02/09/2014 20:20   Ct Head Wo Contrast  02/09/2014   CLINICAL DATA:  Nausea and vomiting 1 day ago, worsening. History of metastatic lung cancer.  EXAM: CT HEAD WITHOUT CONTRAST  TECHNIQUE: Contiguous axial images were obtained from the base of the skull through the vertex without intravenous contrast.  COMPARISON:  Head CT scan 01/11/2014.  Brain MRI 10/08/2013.  FINDINGS: Postoperative change of left occipital craniotomy is again seen for resection of a metastatic deposit. Encephalomalacia  in the underlying left occipital lobe is unchanged. No new metastatic deposit is seen. There is cortical atrophy. 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 No evidence of acute abnormality including infarction, hemorrhage,  midline shift or abnormal extra-axial fluid collection is identified. There is no hydrocephalus or pneumocephalus.  IMPRESSION: No acute finding.  Stable compared to prior exam.  Status post left occipital lobe craniotomy for resection of a left occipital lobe metastasis.  Colloid cyst, unchanged.   Electronically Signed   By: Drusilla Kanner M.D.   On: 02/09/2014 19:42   Ct Angio Chest Pe W/cm &/or Wo Cm  02/09/2014   CLINICAL DATA:  History of lung a liver and brain carcinoma. Completed chemotherapy 2 weeks ago. Short of breath.  EXAM: CT ANGIOGRAPHY CHEST WITH CONTRAST  TECHNIQUE: Multidetector CT imaging of the chest was performed using the standard protocol during bolus administration of intravenous contrast. Multiplanar CT image reconstructions and MIPs were obtained to evaluate the vascular anatomy.  CONTRAST:  OMNIPAQUE IOHEXOL 350 MG/ML SOLN  COMPARISON:  12/17/2013  FINDINGS: No evidence of a pulmonary embolus.  No neck base or axillary masses or adenopathy.  Heart is normal in size. There are moderate coronary artery calcifications. Great vessels are normal in caliber. No aortic dissection. There are mildly prominent right subcarinal and bilateral hilar lymph nodes, stable. None are pathologically enlarged by size criteria. There is enlarged posterior inferior mediastinal lymph node, to the right of the aorta con measuring 15 mm in short axis, previously 13 mm.  There is a masslike area of opacity in the left upper lobe posteriorly lateral to the left hilum. It measures 3.5 cm x 2.3 cm in greatest transverse dimension. This is similar to the prior exam. There is some opacity in the right upper lobe associated with a pulmonary anastomosis staple line, which is stable.  There are new  areas of patchy peribronchovascular opacity in the right middle lobe. These areas have ill-defined margins. 3 mm nodule lies in the right lower lobe not clearly present previously. Several other tiny nodules are suggested bilaterally, which are stable.  No pleural effusion.  Limited evaluation of the upper abdomen shows liver lesions, without significant change and mild adenopathy along the gastrohepatic ligament.  There are degenerative changes noted of the visualized spine. No osteoblastic or osteolytic lesions.  Review of the MIP images confirms the above findings.  IMPRESSION: 1. No evidence of a pulmonary embolus. 2. New area of peribronchovascular opacity in the right middle lobe. Although this could reflect metastatic disease, the pattern is more suggestive of infection or inflammation. 3. Focal masslike opacity in the left upper lobe is stable from the prior CT. Several tiny lung nodules are stable. One nodule measuring 3 mm in the right lower lobe may be new, although the difference between the current the prior study could be due to slice selection differences. 4. Posterior inferior right periaortic mediastinal lymph node has mildly increased in size from the prior study. 5. Liver metastatic disease appears stable, not completely evaluated on this exam.   Electronically Signed   By: Amie Portland M.D.   On: 02/09/2014 21:49     EKG Interpretation   Date/Time:  Wednesday February 09 2014 18:38:07 EDT Ventricular Rate:  154 PR Interval:    QRS Duration: 90 QT Interval:  303 QTC Calculation: 485 R Axis:   102 Text Interpretation:  Atrial fibrillation w RVR Probable right ventricular  hypertrophy Borderline prolonged QT interval Confirmed by Romeo Apple  MD,  Sajid Ruppert ([id number]) on 02/09/2014 7:27:24 PM     CRITICAL CARE Performed by: Maryem Shuffler Mort Sawyers Total critical care time: 30 min Critical care time was exclusive of separately billable procedures  and treating other patients. Critical care was  necessary to treat or prevent imminent or life-threatening deterioration. Critical care was time spent personally by me on the following activities: development of treatment plan with patient and/or surrogate as well as nursing, discussions with consultants, evaluation of patient's response to treatment, examination of patient, obtaining history from patient or surrogate, ordering and performing treatments and interventions, ordering and review of laboratory studies, ordering and review of radiographic studies, pulse oximetry and re-evaluation of patient's condition.   MDM   Final diagnoses:  HCAP (healthcare-associated pneumonia)  Headache  Atrial fibrillation with RVR    7:18 PM 53 y.o. male w hx of lung cancer w/ mets to brain/liver/spleen, CVA, etoh abuse who presents with nausea, vomiting, headache, and generalized weakness. He states that he has not been able to eat or drink much in the last few days and has felt nauseous. He has had 3 episodes of nonbilious nonbloody emesis over the last 2 days. He also had retching. He states that he developed a gradual onset bi-temporal headache yesterday afternoon which is currently a 6/10. He is afebrile and tachycardic to the 140's-150's here. He does have a history of atrial fibrillation. He appears to be in A. fib with RVR. Will initially treat with IV fluids as I suspect there is a component of dehydration. Will get screening labs and imaging.  Last etoh drink was Sunday. Tremulous on exam. Will give 0.$RemoveBef'5mg'vtOGeqkCfN$  ativan IV.   HR dec w/ IVF. Found to have PNA. Will admit to family medicine. They request a CT of the abd, will order.   Blanchard Kelch, MD 02/10/14 1120

## 2014-02-10 ENCOUNTER — Other Ambulatory Visit: Payer: Self-pay | Admitting: Medical Oncology

## 2014-02-10 ENCOUNTER — Inpatient Hospital Stay (HOSPITAL_COMMUNITY): Payer: Medicaid Other

## 2014-02-10 DIAGNOSIS — R5383 Other fatigue: Secondary | ICD-10-CM | POA: Diagnosis not present

## 2014-02-10 DIAGNOSIS — Z515 Encounter for palliative care: Secondary | ICD-10-CM | POA: Diagnosis not present

## 2014-02-10 DIAGNOSIS — Z85118 Personal history of other malignant neoplasm of bronchus and lung: Secondary | ICD-10-CM | POA: Diagnosis not present

## 2014-02-10 DIAGNOSIS — R5381 Other malaise: Secondary | ICD-10-CM | POA: Diagnosis not present

## 2014-02-10 DIAGNOSIS — J189 Pneumonia, unspecified organism: Secondary | ICD-10-CM | POA: Diagnosis not present

## 2014-02-10 DIAGNOSIS — I10 Essential (primary) hypertension: Secondary | ICD-10-CM

## 2014-02-10 DIAGNOSIS — C3490 Malignant neoplasm of unspecified part of unspecified bronchus or lung: Secondary | ICD-10-CM

## 2014-02-10 DIAGNOSIS — E119 Type 2 diabetes mellitus without complications: Secondary | ICD-10-CM

## 2014-02-10 DIAGNOSIS — G939 Disorder of brain, unspecified: Secondary | ICD-10-CM

## 2014-02-10 LAB — COMPREHENSIVE METABOLIC PANEL
ALT: 21 U/L (ref 0–53)
AST: 49 U/L — ABNORMAL HIGH (ref 0–37)
Albumin: 3 g/dL — ABNORMAL LOW (ref 3.5–5.2)
Alkaline Phosphatase: 130 U/L — ABNORMAL HIGH (ref 39–117)
BILIRUBIN TOTAL: 1.5 mg/dL — AB (ref 0.3–1.2)
BUN: 7 mg/dL (ref 6–23)
CHLORIDE: 98 meq/L (ref 96–112)
CO2: 28 mEq/L (ref 19–32)
CREATININE: 0.66 mg/dL (ref 0.50–1.35)
Calcium: 7.8 mg/dL — ABNORMAL LOW (ref 8.4–10.5)
GFR calc Af Amer: >90 mL/min (ref >90)
GFR calc non Af Amer: >90 mL/min (ref >90)
Glucose, Bld: 108 mg/dL — ABNORMAL HIGH (ref 70–99)
Potassium: 3.2 mEq/L — ABNORMAL LOW (ref 3.7–5.3)
Sodium: 141 mEq/L (ref 137–147)
Total Protein: 6 g/dL (ref 6.0–8.3)

## 2014-02-10 LAB — GLUCOSE, CAPILLARY
GLUCOSE-CAPILLARY: 88 mg/dL (ref 70–99)
GLUCOSE-CAPILLARY: 149 mg/dL — AB (ref 70–99)
Glucose-Capillary: 66 mg/dL — ABNORMAL LOW (ref 70–99)
Glucose-Capillary: 80 mg/dL (ref 70–99)
Glucose-Capillary: 135 mg/dL — ABNORMAL HIGH (ref 70–99)

## 2014-02-10 LAB — CBC
HCT: 31.1 % — ABNORMAL LOW (ref 39.0–52.0)
HEMOGLOBIN: 11.2 g/dL — AB (ref 13.0–17.0)
MCH: 37.1 pg — ABNORMAL HIGH (ref 26.0–34.0)
MCHC: 36 g/dL (ref 30.0–36.0)
MCV: 103 fL — AB (ref 78.0–100.0)
PLATELETS: 68 10*3/uL — AB (ref 150–400)
RBC: 3.02 MIL/uL — ABNORMAL LOW (ref 4.22–5.81)
RDW: 14 % (ref 11.5–15.5)
WBC: 4.7 10*3/uL (ref 4.0–10.5)

## 2014-02-10 MED ORDER — GABAPENTIN 100 MG PO CAPS
100.0000 mg | ORAL_CAPSULE | Freq: Three times a day (TID) | ORAL | Status: DC
  Administered 2014-02-10 – 2014-02-11 (×5): 100 mg via ORAL
  Filled 2014-02-10 (×7): qty 1

## 2014-02-10 MED ORDER — SODIUM CHLORIDE 0.9 % IV SOLN
INTRAVENOUS | Status: DC
  Administered 2014-02-10: 01:00:00 via INTRAVENOUS

## 2014-02-10 MED ORDER — INSULIN ASPART 100 UNIT/ML ~~LOC~~ SOLN
0.0000 [IU] | Freq: Three times a day (TID) | SUBCUTANEOUS | Status: DC
  Administered 2014-02-10: 2 [IU] via SUBCUTANEOUS
  Administered 2014-02-11 (×2): 3 [IU] via SUBCUTANEOUS

## 2014-02-10 MED ORDER — ACETAMINOPHEN 650 MG RE SUPP: 650.0000 mg | Freq: Four times a day (QID) | RECTAL | Status: DC | PRN

## 2014-02-10 MED ORDER — ONDANSETRON HCL 4 MG/2ML IJ SOLN: 4.0000 mg | Freq: Four times a day (QID) | INTRAMUSCULAR | Status: DC | PRN

## 2014-02-10 MED ORDER — LORAZEPAM 1 MG PO TABS: 1.0000 mg | ORAL_TABLET | Freq: Four times a day (QID) | ORAL | Status: DC | PRN

## 2014-02-10 MED ORDER — VANCOMYCIN HCL 10 G IV SOLR
1250.0000 mg | Freq: Two times a day (BID) | INTRAVENOUS | Status: DC
  Administered 2014-02-10 – 2014-02-11 (×3): 1250 mg via INTRAVENOUS
  Filled 2014-02-10 (×4): qty 1250

## 2014-02-10 MED ORDER — DEXTROSE-NACL 5-0.45 % IV SOLN
INTRAVENOUS | Status: DC
  Administered 2014-02-10 – 2014-02-11 (×2): via INTRAVENOUS

## 2014-02-10 MED ORDER — POTASSIUM CHLORIDE CRYS ER 20 MEQ PO TBCR
40.0000 meq | EXTENDED_RELEASE_TABLET | Freq: Once | ORAL | Status: AC
  Administered 2014-02-10: 40 meq via ORAL
  Filled 2014-02-10: qty 2

## 2014-02-10 MED ORDER — HEPARIN SODIUM (PORCINE) 5000 UNIT/ML IJ SOLN: 5000.0000 [IU] | Freq: Three times a day (TID) | INTRAMUSCULAR | Status: DC

## 2014-02-10 MED ORDER — ADULT MULTIVITAMIN W/MINERALS CH
1.0000 | ORAL_TABLET | Freq: Every day | ORAL | Status: DC
  Administered 2014-02-10 – 2014-02-11 (×2): 1 via ORAL
  Filled 2014-02-10 (×2): qty 1

## 2014-02-10 MED ORDER — INSULIN ASPART 100 UNIT/ML ~~LOC~~ SOLN: 0.0000 [IU] | Freq: Every day | SUBCUTANEOUS | Status: DC

## 2014-02-10 MED ORDER — ACETAMINOPHEN 325 MG PO TABS: 650.0000 mg | ORAL_TABLET | Freq: Four times a day (QID) | ORAL | Status: DC | PRN

## 2014-02-10 MED ORDER — DOCUSATE SODIUM 100 MG PO CAPS
100.0000 mg | ORAL_CAPSULE | Freq: Two times a day (BID) | ORAL | Status: DC
  Filled 2014-02-10 (×4): qty 1

## 2014-02-10 MED ORDER — ONDANSETRON HCL 4 MG PO TABS: 4.0000 mg | ORAL_TABLET | Freq: Four times a day (QID) | ORAL | Status: DC | PRN

## 2014-02-10 MED ORDER — NICOTINE 14 MG/24HR TD PT24
14.0000 mg | MEDICATED_PATCH | Freq: Every day | TRANSDERMAL | Status: DC
  Administered 2014-02-10 – 2014-02-11 (×2): 14 mg via TRANSDERMAL
  Filled 2014-02-10 (×3): qty 1

## 2014-02-10 MED ORDER — POTASSIUM CHLORIDE CRYS ER 20 MEQ PO TBCR
40.0000 meq | EXTENDED_RELEASE_TABLET | Freq: Once | ORAL | Status: AC
  Administered 2014-02-10: 40 meq via ORAL

## 2014-02-10 MED ORDER — SODIUM CHLORIDE 0.9 % IJ SOLN: 3.0000 mL | Freq: Two times a day (BID) | INTRAMUSCULAR | Status: DC

## 2014-02-10 MED ORDER — LEVETIRACETAM 500 MG PO TABS
500.0000 mg | ORAL_TABLET | Freq: Two times a day (BID) | ORAL | Status: DC
  Administered 2014-02-10 – 2014-02-11 (×4): 500 mg via ORAL
  Filled 2014-02-10 (×5): qty 1

## 2014-02-10 MED ORDER — PANTOPRAZOLE SODIUM 40 MG PO TBEC
40.0000 mg | DELAYED_RELEASE_TABLET | Freq: Every day | ORAL | Status: DC
  Administered 2014-02-10 – 2014-02-11 (×2): 40 mg via ORAL
  Filled 2014-02-10: qty 1

## 2014-02-10 MED ORDER — VITAMIN B-1 100 MG PO TABS
100.0000 mg | ORAL_TABLET | Freq: Every day | ORAL | Status: DC
  Administered 2014-02-10 – 2014-02-11 (×2): 100 mg via ORAL
  Filled 2014-02-10 (×2): qty 1

## 2014-02-10 MED ORDER — LORAZEPAM 2 MG/ML IJ SOLN: 1.0000 mg | Freq: Four times a day (QID) | INTRAMUSCULAR | Status: DC | PRN

## 2014-02-10 MED ORDER — FOLIC ACID 1 MG PO TABS
1.0000 mg | ORAL_TABLET | Freq: Every day | ORAL | Status: DC
  Administered 2014-02-10 – 2014-02-11 (×2): 1 mg via ORAL
  Filled 2014-02-10 (×2): qty 1

## 2014-02-10 MED ORDER — ENSURE COMPLETE PO LIQD
237.0000 mL | Freq: Two times a day (BID) | ORAL | Status: DC
  Administered 2014-02-10: 237 mL via ORAL

## 2014-02-10 NOTE — Progress Notes (Signed)
INITIAL NUTRITION ASSESSMENT  DOCUMENTATION CODES Per approved criteria  -Severe malnutrition in the context of chronic illness   INTERVENTION: Continue Ensure Complete po BID, each supplement provides 350 kcal and 13 grams of protein RD to follow for nutrition care plan  NUTRITION DIAGNOSIS: Increased nutrient needs related to catabolic illness as evidenced by estimated nutrition needs  Goal: Pt to meet >/= 90% of their estimated nutrition needs   Monitor:  PO & supplemental intake, goals of care, weight, labs, I/O's  Reason for Assessment: Consult, Malnutrition Screening Tool Report  53 y.o. male  Admitting Dx: cough, SOB  ASSESSMENT: 53 y.o. Male presenting with 2 days of productive cough, fever/chills, generalized weakness / dizziness, found to have HCAP on Chest CT.  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  Patient known to Clinical Nutrition during previous hospitalization; pt reports he is limited to certain foods due to poor dentition and lacks salivary glands; eats a lot of prepared foods such as Ramen noodles or [employer]; PO intake 100% per flowsheet records; drinks Ensure supplements at home; orders in place during this hospitalization.  Breakfast: eggs or sausage Lunch: Ensure supplement or soup with crackers or can of veggies Dinner: Ravioli or Progresso soup   Pt with persistent EtOH use, recently increased, drinks only Vodka, admits to half gallon in 4 days.  Nutrition Focused Physical Exam:   Subcutaneous Fat:  Orbital Region: mild wasting  Upper Arm Region: moderate wasting  Thoracic and Lumbar Region: moderate to severe wasting   Muscle:  Temple Region: mild wasting  Clavicle Bone Region: mild wasting  Clavicle and Acromion  Bone Region: mild wasting  Scapular Bone Region: NA  Dorsal Hand: mild wasting  Patellar Region: severe wasting  Anterior Thigh Region: moderate wasting  Posterior Calf Region: severe wasting   Edema: none   Pt continues to meet criteria for severe malnutrition in the context of chronic illness as evidenced by < 75% intake of estimated energy requirement for > 1 month and severe muscle mass loss.  Height: Ht Readings from Last 1 Encounters:  02/10/14 6' 0.83" (1.85 m)    Weight: Wt Readings from Last 1 Encounters:  02/10/14 160 lb 8 oz (72.802 kg)    Ideal Body Weight: 178 lb  % Ideal Body Weight: 90%  Wt Readings from Last 20 Encounters:  02/10/14 160 lb 8 oz (72.802 kg)  01/21/14 162 lb (73.483 kg)  01/11/14 148 lb 3.2 oz (67.223 kg)  01/06/14 160 lb 6.4 oz (72.757 kg)  12/21/13 156 lb 14.4 oz (71.169 kg)  12/02/13 153 lb (69.4 kg)  10/21/13 149 lb 9.6 oz (67.858 kg)  10/14/13 149 lb 3.2 oz (67.677 kg)  10/07/13 153 lb 3.5 oz (69.5 kg)  09/20/13 150 lb 6.4 oz (68.221 kg)  09/01/13 154 lb 14.4 oz (70.262 kg)  08/12/13 152 lb 1.6 oz (68.992 kg)  08/09/13 156 lb 8 oz (70.988 kg)  07/19/13 162 lb 11.2 oz (73.8 kg)  06/28/13 169 lb (76.658 kg)  06/14/13 160 lb 3.2 oz (72.666 kg)  06/14/13 161 lb (73.029 kg)  06/01/13 171 lb (77.565 kg)  05/27/13 172 lb 4.8 oz (78.155 kg)  05/21/13 171 lb 14.4 oz (77.973 kg)    Usual Body Weight: 162 lb  % Usual Body Weight:  98%  BMI:  Body mass index is 21.27 kg/(m^2).  Estimated Nutritional Needs: Kcal: 1800-2000 Protein: 90-100 gm Fluid: 1.8-2.0 L  Skin: Intact  Diet Order: Criss Rosales  EDUCATION NEEDS: -No education needs identified at this time   Intake/Output Summary (Last 24 hours) at 02/10/14 1413 Last data filed at 02/10/14 0900  Gross per 24 hour  Intake    240 ml  Output    877 ml  Net   -637 ml    Labs:   Recent Labs Lab 02/09/14 1900 02/10/14 0302  NA 141 141  K 3.5* 3.2*  CL 95* 98  CO2 25 28   BUN 10 7  CREATININE 0.65 0.66  CALCIUM 8.7 7.8*  GLUCOSE 267* 108*    CBG (last 3)   Recent Labs  02/10/14 0617 02/10/14 0651 02/10/14 1127  GLUCAP 66* 88 80    Scheduled Meds: . ceFEPime (MAXIPIME) IV  2 g Intravenous Q12H  . docusate sodium  100 mg Oral BID  . feeding supplement (ENSURE COMPLETE)  237 mL Oral BID BM  . folic acid  1 mg Oral Daily  . gabapentin  100 mg Oral TID  . insulin aspart  0-15 Units Subcutaneous TID WC  . insulin aspart  0-5 Units Subcutaneous QHS  . levETIRAcetam  500 mg Oral BID  . multivitamin with minerals  1 tablet Oral Daily  . nicotine  14 mg Transdermal Daily  . pantoprazole  40 mg Oral Daily  . potassium chloride  40 mEq Oral Once  . sodium chloride  3 mL Intravenous Q12H  . thiamine  100 mg Oral Daily  . vancomycin  1,250 mg Intravenous Q12H    Continuous Infusions: . dextrose 5 % and 0.45% NaCl 125 mL/hr at 02/10/14 1144    Past Medical History  Diagnosis Date  . Atrial fibrillation   . Diabetes mellitus   . 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 Allergy   . Substance abuse   . Depression   . Stroke     3 previous strokes - Oct, Nov, and Dec 2012  . Throat cancer     September 2008  . Lung cancer     lobectomy in 2010  . Heart murmur   . Hx of radiation therapy 05/18/13- 06/01/13    whole brain, 30 Gy, 10 fractions    Past Surgical History  Procedure Laterality Date  . Lobectomy    . Craniotomy Left 04/27/2013    Procedure: Left Occipital Craniotomy for resection of tumor with microscope;  Surgeon: Floyce Stakes, MD;  Location: [facility];  Service: Neurosurgery;  Laterality: Left;    Arthur Holms, RD, LDN Pager #: [phone] After-Hours Pager #: [phone]

## 2014-02-10 NOTE — Progress Notes (Addendum)
Full note to follow:  I have met with William Townsend before. We continued our conversation today from last time regarding code status and continuing chemotherapy. He is considering DNR and considering stopping chemotherapy. He plans to discuss with Dr. Julien Nordmann his options for chemotherapy. Right now he is thinking that he would like to attempt further treatment but if he has side effects or it makes him feel worse he would not wish to continue treatment. He is also considering code status - he tells me that he wants more time but that he does not want to suffer. We discussed CPR/intubation vs comfort care when this time comes. He would like to take more time to consider the options. I encouraged him to think about what he really wants for himself before discussing with his family. He is the one that has to live through these options and it has to be the right decision for him. He feels like he is getting a lot of pressure from his family to continue with all aggressive treatment. We also discussed that assisted living might be a good option for him and he is considering this option (he is concerned about finances). I will continue to talk and support William Townsend to make the decisions that are right for him.  Vinie Sill, NP Palliative Medicine Team Pager # [phone] (M-F 8a-5p) Team Phone # [phone] (Nights/Weekends)

## 2014-02-10 NOTE — Consult Note (Signed)
Patient William Townsend      DOB: Dec 26, 1960      HDQ:[id number]     Consult Note from the Palliative Medicine Team at [facility] Requested by:  Dr. Bonner Puna    PCP: [name], DO Reason for Consultation: [place]    Phone Number:[phone]  Assessment of patients Current state: William Townsend is a 53 yo male pneumonia. 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 Also PMH of tonsillar cancer 2008, multiple CVAs, alcohol abuse, tobacco abuse, diabetes mellitus, and hypertension.   I have met with William Townsend before. We continued our conversation today from last time regarding code status and continuing chemotherapy. He is considering DNR and considering stopping chemotherapy. He plans to discuss with Dr. Julien Nordmann his options for chemotherapy. Right now he is thinking that he would like to attempt further treatment but if he has side effects or it makes him feel worse he would not wish to continue treatment. He is also considering code status - he tells me that he wants more time but that he does not want to suffer. We discussed CPR/intubation vs comfort care when this time comes. He would like to take more time to consider the options. I encouraged him to think about what he really wants for himself before discussing with his family. He is the one that has to live through these options and it has to be the right decision for him (he says especially from his brother). He feels like he is getting a lot of pressure from his family to continue with aggressive treatment. We also discussed that assisted living might be a good option for him and he is considering this option (he is concerned about finances). He is also a heavy drinker which will likely impact his decision on disposition as he is not ready to stop his etoh abuse. I will continue to talk and support William Townsend  to make the decisions that are right for him.   Goals of Care: 1.  Code Status: FULL   2. Scope of Treatment: Continue all medical interventions. No limitations on care.    4. Disposition: To be determined.    3. Symptom Management:   1. Anxiety/Agitation: Lorazepam per CIWA protocol.  2. Pain: Gabapentin for neuropathic foot pain.  3. Bowel Regimen: Colace scheduled.  4. Fever: Acetaminophen prn.  5. Nausea/Vomiting: Ondansetron prn.   4. Psychosocial: Emotional support provided to patient.   5. Spiritual: Says he is "not a spiritual person."    Brief HPI: 53 yo with metastatic lung cancer.    ROS: + neuropathic foot pain, denies anxiety/constipation/dyspnea    PMH:  Past Medical History  Diagnosis Date  . Atrial fibrillation   . Diabetes mellitus   . 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 Allergy   . Substance abuse   . Depression   . Stroke     3 previous strokes - Oct, Nov, and Dec 2012  . Throat cancer     September 2008  . Lung cancer     lobectomy in 2010  . Heart murmur   . Hx of radiation therapy 05/18/13- 06/01/13    whole brain, 30 Gy, 10 fractions     PSH: Past Surgical History  Procedure Laterality Date  . Lobectomy    . Craniotomy Left 04/27/2013    Procedure: Left Occipital  Craniotomy for resection of tumor with microscope;  Surgeon: Floyce Stakes, MD;  Location: [facility];  Service: Neurosurgery;  Laterality: Left;   I have reviewed the [place] and SH and  If appropriate update it with new information. Allergies  Allergen Reactions  . Vicodin [Hydrocodone-Acetaminophen] Itching   Scheduled Meds: . ceFEPime (MAXIPIME) IV  2 g Intravenous Q12H  . docusate sodium  100 mg Oral BID  . feeding supplement (ENSURE COMPLETE)  237 mL Oral BID BM  . folic acid  1 mg Oral Daily  . gabapentin  100 mg Oral TID  . insulin aspart  0-15 Units Subcutaneous TID WC  . insulin aspart  0-5 Units Subcutaneous QHS  . levETIRAcetam  500 mg Oral BID  .  multivitamin with minerals  1 tablet Oral Daily  . nicotine  14 mg Transdermal Daily  . pantoprazole  40 mg Oral Daily  . potassium chloride  40 mEq Oral Once  . sodium chloride  3 mL Intravenous Q12H  . thiamine  100 mg Oral Daily  . vancomycin  1,250 mg Intravenous Q12H   Continuous Infusions: . dextrose 5 % and 0.45% NaCl 125 mL/hr at 02/10/14 1144   PRN Meds:.acetaminophen, acetaminophen, LORazepam, LORazepam, ondansetron (ZOFRAN) IV, ondansetron    BP 101/70  Pulse 89  Temp(Src) 98.6 F (37 C) (Oral)  Resp 18  Ht 6' 0.83" (1.85 m)  Wt 72.802 kg (160 lb 8 oz)  BMI 21.27 kg/m2  SpO2 100%   PPS: 50%   Intake/Output Summary (Last 24 hours) at 02/10/14 1249 Last data filed at 02/10/14 0900  Gross per 24 hour  Intake    240 ml  Output    877 ml  Net   -637 ml   LBM: 02/10/14                          Physical Exam:  General: NAD, alert, pleasant  HEENT: [place]/AT, no JVD, oral mucosa dry without exudate  Chest: Coarse throughout, no labored breathing  CVS: RRR, S1 S2  Abdomen: Soft, NT, ND, +BS  Ext: MAE, no edema, warm to touch  Neuro: Alert, oriented x 3, follows commands   Labs: CBC    Component Value Date/Time   WBC 4.7 02/10/2014 0302   WBC 2.5* 01/21/2014 0928   RBC 3.02* 02/10/2014 0302   RBC 3.47* 01/21/2014 0928   HGB 11.2* 02/10/2014 0302   HGB 13.1 01/21/2014 0928   HCT 31.1* 02/10/2014 0302   HCT 37.2* 01/21/2014 0928   PLT 68* 02/10/2014 0302   PLT 174 01/21/2014 0928   MCV 103.0* 02/10/2014 0302   MCV 107.4* 01/21/2014 0928   MCH 37.1* 02/10/2014 0302   MCH 37.8* 01/21/2014 0928   MCHC 36.0 02/10/2014 0302   MCHC 35.2 01/21/2014 0928   RDW 14.0 02/10/2014 0302   RDW 14.8* 01/21/2014 0928   LYMPHSABS 0.2* 02/09/2014 1900   LYMPHSABS 0.7* 01/21/2014 0928   MONOABS 0.4 02/09/2014 1900   MONOABS 0.3 01/21/2014 0928   EOSABS 0.0 02/09/2014 1900   EOSABS 0.0 01/21/2014 0928   BASOSABS 0.0 02/09/2014 1900   BASOSABS 0.0 01/21/2014 0928    BMET    Component Value Date/Time    NA 141 02/10/2014 0302   NA 144 01/21/2014 0928   K 3.2* 02/10/2014 0302   K 3.6 01/21/2014 0928   CL 98 02/10/2014 0302   CO2 28 02/10/2014 0302   CO2 23 01/21/2014 1007  GLUCOSE 108* 02/10/2014 0302   GLUCOSE 164* 01/21/2014 0928   BUN 7 02/10/2014 0302   BUN 7.5 01/21/2014 0928   CREATININE 0.66 02/10/2014 0302   CREATININE 0.8 01/21/2014 0928   CREATININE 1.04 01/28/2012 1454   CALCIUM 7.8* 02/10/2014 0302   CALCIUM 10.1 01/21/2014 0928   GFRNONAA >90 02/10/2014 0302   GFRAA >90 02/10/2014 0302    CMP     Component Value Date/Time   NA 141 02/10/2014 0302   NA 144 01/21/2014 0928   K 3.2* 02/10/2014 0302   K 3.6 01/21/2014 0928   CL 98 02/10/2014 0302   CO2 28 02/10/2014 0302   CO2 23 01/21/2014 0928   GLUCOSE 108* 02/10/2014 0302   GLUCOSE 164* 01/21/2014 0928   BUN 7 02/10/2014 0302   BUN 7.5 01/21/2014 0928   CREATININE 0.66 02/10/2014 0302   CREATININE 0.8 01/21/2014 0928   CREATININE 1.04 01/28/2012 1454   CALCIUM 7.8* 02/10/2014 0302   CALCIUM 10.1 01/21/2014 0928   PROT 6.0 02/10/2014 0302   PROT 6.9 01/21/2014 0928   ALBUMIN 3.0* 02/10/2014 0302   ALBUMIN 3.6 01/21/2014 0928   AST 49* 02/10/2014 0302   AST 37* 01/21/2014 0928   ALT 21 02/10/2014 0302   ALT 40 01/21/2014 0928   ALKPHOS 130* 02/10/2014 0302   ALKPHOS 142 01/21/2014 0928   BILITOT 1.5* 02/10/2014 0302   BILITOT 0.68 01/21/2014 0928   GFRNONAA >90 02/10/2014 0302   GFRAA >90 02/10/2014 0302     Time In Time Out Total Time Spent with Patient Total Overall Time  1150 1250 8mn 680m    Greater than 50%  of this time was spent counseling and coordinating care related to the above assessment and plan.  AlVinie SillNP Palliative Medicine Team Pager # [phone]-F 8a-5p) Team Phone # [phone]/Weekends)

## 2014-02-10 NOTE — H&P (Signed)
FMTS Attending Admit Note Patient seen and examined by me, discussed with resident team and I agree with Dr Darnelle Going note for today. Patient with generalized weakness, cough, and reporting occipital HA this morning.  HA is in keeping with the characteristic of his history of headaches.  Feels better since admission and receving Vanc/Cefepime for HCAP, identified on CXR and clarified on CTA, which was negative for PE. On exam he is comfortable-appearing, no apparent distress.  Large ecchymosis along the L flank, tender.  Diffuse rhonchi on lung exam bilaterally.  Plan to continue treatment for HCAP; patient expresses interest in stopping chemotherapy for his metastatic small cell lung CA. To communicate with his oncologist regarding larger plan upon dispo. PT/OT evals given history of falls at home. Dalbert Mayotte, MD

## 2014-02-10 NOTE — ED Notes (Addendum)
MD still at bedside. Pt still in room A-3.

## 2014-02-10 NOTE — Progress Notes (Signed)
Clinical Social Work Department CLINICAL SOCIAL WORK PLACEMENT NOTE 02/10/2014  Patient:  William Townsend, William Townsend  Account Number:  [id number] Admit date:  02/09/2014  Clinical Social Worker:  Hunt Oris, Latanya Presser  Date/time:  02/10/2014 04:07 PM  Clinical Social Work is seeking post-discharge placement for this patient at the following level of care:   SKILLED NURSING   (*CSW will update this form in Epic as items are completed)   02/10/2014  Patient/family provided with [facility] Department of Clinical Social Work's list of facilities offering this level of care within the geographic area requested by the patient (or if unable, by the patient's family).  02/10/2014  Patient/family informed of their freedom to choose among providers that offer the needed level of care, that participate in Medicare, Medicaid or managed care program needed by the patient, have an available bed and are willing to accept the patient.  02/10/2014  Patient/family informed of [facility]' ownership interest in [facility], as well as of the fact that they are under no obligation to receive care at this facility.  PASARR submitted to EDS on  PASARR number received from [facility] on   FL2 transmitted to all facilities in geographic area requested by pt/family on  02/10/2014 FL2 transmitted to all facilities within larger geographic area on   Patient informed that his/her managed care company has contracts with or will negotiate with  certain facilities, including the following:     Patient/family informed of bed offers received:   Patient chooses bed at  Physician recommends and patient chooses bed at    Patient to be transferred to  on   Patient to be transferred to facility by   The following physician request were entered in Epic:   Additional Comments:  Hunt Oris, MSW, [place]

## 2014-02-10 NOTE — Care Management Note (Unsigned)
    Page 1 of 1   02/10/2014     3:02:53 PM CARE MANAGEMENT NOTE 02/10/2014  Patient:  William Townsend, William Townsend   Account Number:  [id number]  Date Initiated:  02/10/2014  Documentation initiated by:  Marijean Montanye  Subjective/Objective Assessment:   Pt adm on 6/3 with dehydration, Afib.  PTA, pt lives with roommate and rooommate's wife.  Pt has CA; just finished chemo.     Action/Plan:   PT recommending SNF at dc; CSW consulted to facilitate poss dc to SNF when medically stable.   Anticipated DC Date:  02/14/2014   Anticipated DC Plan:  SKILLED NURSING FACILITY  In-house referral  Clinical Social Worker      DC Planning Services  CM consult      Choice offered to / List presented to:             Status of service:  In process, will continue to follow Medicare Important Message given?   (If response is "NO", the following Medicare IM given date fields will be blank) Date Medicare IM given:   Date Additional Medicare IM given:    Discharge Disposition:    Per UR Regulation:  Reviewed for med. necessity/level of care/duration of stay  If discussed at [place] of Stay Meetings, dates discussed:    Comments:

## 2014-02-10 NOTE — Progress Notes (Signed)
Clinical Social Work Department BRIEF PSYCHOSOCIAL ASSESSMENT 02/10/2014  Patient:  William Townsend, William Townsend     Account Number:  [id number]     Admit date:  02/09/2014  Clinical Social Worker:  Valda Lamb  Date/Time:  02/10/2014 03:22 PM  Referred by:  Physician  Date Referred:  02/10/2014 Referred for  SNF Placement   Other Referral:   Interview type:  Patient Other interview type:   CSW also completed assessment with pt brother Timoteo Expose    PSYCHOSOCIAL DATA Living Status:  FRIEND(S) Admitted from facility:   Level of care:   Primary support name:  Timoteo Expose Primary support relationship to patient:  SIBLING Degree of support available:   Pt reports his brother is supportive. Pt also stated that his daughter lives on the [place] and he could potentially move in with her.    CURRENT CONCERNS Current Concerns  Post-Acute Placement   Other Concerns:    SOCIAL WORK ASSESSMENT / PLAN CSW informed that PT has recommened SNF placement for pt.    CSW visited pt room and introduced myself and role. CSW confirmed with pt that it was okay for pt brother to remain present in the room. CSW informed pt of recommendations for SNF placement and explored what pt's thoughts were regarding placement, as pt declined a few weeks ago. Pt stated his plan is to return to live with his friend in [place] and move to [place] next week. Pt also stated that he has plans to move in with his daughter to the [place] did not appear to have an exact plan in place at discharge. CSW encouraged pt to consider placement for the time being until he has another palce to stay whether in [place] or at the [place] with his daughter. Pt stated he would consider placement near a bus route and would like his brother to visit prior to making a decision. CSW informed pt that CSW would do a SNF search and CSW or a coworker would provide bed offers.    CSW also spoke to pt about [place] and SNF from  home as CSW is the Education officer, museum at pt PCP office. Pt is agreeable to stay in touch with CSW if he returns home as CSW could complete the necessary paperwork to get pt an aide and or assist with SNF placement.   Assessment/plan status:  Psychosocial Support/Ongoing Assessment of Needs Other assessment/ plan:   Information/referral to community resources:   CSW provided pt with a SNF list and CSW clinic contact number.    PATIENT'S/FAMILY'S RESPONSE TO PLAN OF CARE: Pt alert and oriented and very pleasant to speak with. Pt states he is unsure as to whether he would like to continue chemotherapy. Pt has plans to discuss this with his doctor - pt leaning towards continuing treatment unless it will make him sick. Pt did bring up DNR code status and stated that he would not want to be in pain, "It would hurt if they were cracking my chest." CSW validated feelings and informed pt that he had a right to his feelings as only he understands how he feels.    Pt is agreeable to intervention and will consider placement at discharge.       Hunt Oris, MSW, [place]

## 2014-02-10 NOTE — Progress Notes (Signed)
Hypoglycemic Event  CBG: 66  Treatment: 4 oz of juice  Symptoms: asymptomatic  Follow-up CBG: Time: 0651 CBG Result:88  Possible Reasons for Event: pt was NPO, then to clear liquids.      William Townsend  Remember to initiate Hypoglycemia Order Set & complete

## 2014-02-10 NOTE — Discharge Summary (Signed)
[facility] Discharge Summary  Patient name: William Townsend Medical record number: [id number] Date of birth: 1960/12/29 Age: 53 y.o. Gender: male Date of Admission: 02/09/2014  Date of Discharge: 02/11/14 Admitting Physician: Willeen Niece, MD  Primary Care Provider: [name], DO Consultants: Palliative  Indication for Hospitalization: SOB, generalized weakness, vomiting  Discharge Diagnoses/Problem List:  RML HCAP, recent hospitalization / chemotherapy - Improved Generalized weakness, suspected secondary to HCAP, decreased PO, dehydration, deconditioning - Improved Pre-syncope / Syncopal episodes, with secondary falls Nausea / Abdominal pain - Resolved Small Cell Lung CA (Stage 4), known metastases to brain, liver, spleen Chronic Anemia, thrombocytopenia Substance abuse, alcohol and tobacco T2DM H/o HTN H/o CVA Hypokalemia - Resolved  Disposition: Home (refused SNF), coordination with CSW outpt for possible future ALF placement  Discharge Condition: Stable  Discharge Exam: General: thin, pleasant, cooperative, NAD  Cardiovascular: RRR, no murmurs  Respiratory: Mostly CTAB, mild bibasilar rhonchi improved with cough, good air movement, no increased WOB  Abdomen: soft, NTND, +active BS  Extremities: moves all ext, non-tender no edema  Neuro - AAOx3, grossly non-focal, normal gait  Brief Hospital Course:  William Townsend is a 53 y.o. male who presented with cough, generalized weakness, n/v, dizziness. PMH significant for Small cell lung CA (Stage 4, s/p lobectomy, adjv chemo 2010) with mets to the brain (s/p occipital resection 2014), liver and spleen, tonsillar CA, multiple CVAs, EtOH and tobacco abuse, T2DM and HTN. Initially presented with generalized constellation of symptoms, clinical concern for potential PNA, CXR and Chest CT performed to confirm RML HCAP (recent admission, chemo), no documented fever, WBC nml 5.5 (h/o leukopenia on  chemo), lung exam with diffuse rhonchi. Blood cultures collected, prior to starting Vancomycin / Cefepime per pharm for empiric HCAP coverage. Additional work-up with unremarkable labs except elevated alk phos 170, T.Bili 2.6, low K (3.2), hyperglycemia (260s), EKG with (AFib RVR 150s), HR dramatically improved to normal following 2L IV bolus. Concerns for generalized weakness / pre-syncopal episodes, Head CT (no acute findings, stable from recent), suspected component of dehydration, poor PO intake, nausea (improved with Zofran). Of note, patient recent with increased EtOH use (vodka half gallon in 4 days, last drink >24 hrs ago), placed on CIWA. Admitted for continued IV antibiotics for HCAP, and further eval PT/OT, Nutrition.  During hospitalization, patient demonstrated dramatic improvement within 24-36 hours. Continued empiric IV antibiotics, blood cultures resulted with no growth x 24 hours. Patient remained afebrile, no O2 req, stable WBC, dramatic improved resp status, mostly asymptomatic, tolerating PO and ambulation, transitioned to PO antibiotics with Levaquin $RemoveBefor'750mg'[id number]$  daily x 10 days. Patient desires discharge, determined medically stable and arranged close outpatient follow-up.  Issues for Follow Up:   1. RML HCAP - Complete Levaquin $RemoveBeforeDEI'750mg'[id number]$  daily x 10 days total abx (last dose 02/19/14).  2. Oncology - Previously receiving palliative chemo per Dr. Julien Nordmann, last treatment >3 weeks ago, pt considering discontinuing therapy. Currently plans to discuss plans at next chemo apt 02/18/14.  3. Palliative / [place] / placement - Decision to switch to DNR, reported family pressure to continue therapies. Refusing SNF at this time, but plans to coordinate with [facility] CSW for potential ALF placement / [place] arrangements following discharge.  4. Tobacco - Nicotine patches in hospital. Continue smoking cessation  5. EtOH abuse - continue counseling  Significant Procedures: none  Significant Labs and Imaging:    Recent Labs Lab 02/09/14 1900 02/10/14 0302 02/11/14 0429  WBC 5.5 4.7 3.1*  HGB 13.4  11.2* 12.2*  HCT 36.7* 31.1* 33.5*  PLT 74* 68* 63*    Recent Labs Lab 02/09/14 1900 02/10/14 0302 02/11/14 0429  NA 141 141 138  K 3.5* 3.2* 3.5*  CL 95* 98 101  CO2 $Re'25 28 26  'sGK$ GLUCOSE 267* 108* 119*  BUN 10 7 5*  CREATININE 0.65 0.66 0.74  CALCIUM 8.7 7.8* 8.4  ALKPHOS 173* 130*  --   AST 61* 49*  --   ALT 27 21  --   ALBUMIN 3.7 3.0*  --    Trop (negative)  Lipase - 23  Elevated T.Bili - 2.6  UA - spec grav 1.029, ketone 40, neg nit, neg LE, 0-2 WBC  6/3 Blood Culture x 2 (NGTD x 24 hours)  6/3 CXR 2v  IMPRESSION:  1. A left upper lobe infiltrate or cavitary mass is suspected. This  may reflect infectious or neoplastic processes. A patchy infiltrate  in the right infrahilar region is present.  2. There is no evidence of CHF nor pleural effusion.  3. Chest CT scanning is recommended.  6/3 Head CT w/o contrast  IMPRESSION:  No acute finding. Stable compared to prior exam.  Status post left occipital lobe craniotomy for resection of a left  occipital lobe metastasis.  Colloid cyst, unchanged.  6/3 CT Chest  IMPRESSION:  1. No evidence of a pulmonary embolus.  2. New area of peribronchovascular opacity in the right middle lobe.  Although this could reflect metastatic disease, the pattern is more  suggestive of infection or inflammation.  3. Focal masslike opacity in the left upper lobe is stable from the  prior CT. Several tiny lung nodules are stable. One nodule measuring  3 mm in the right lower lobe may be new, although the difference  between the current the prior study could be due to slice selection  differences.  4. Posterior inferior right periaortic mediastinal lymph node has  mildly increased in size from the prior study.  5. Liver metastatic disease appears stable, not completely evaluated  on this exam.  6/4 CT Abd / Pelvis  IMPRESSION:  1. No acute  intra-abdominal findings.  2. Right middle lobe pneumonia.  3. Known hepatic and nodal metastatic disease.  Results/Tests Pending at Time of Discharge:  1. Blood cultures (collected 02/09/14 - NGTD x 24 hours) - pending final results  Discharge Medications:    Medication List         accu-chek multiclix lancets  Use as instructed     ACCU-CHEK NANO SMARTVIEW W/DEVICE Kit  Measure blood sugar once daily prior to taking amaryl in the morning.     docusate sodium 100 MG capsule  Commonly known as:  COLACE  Take 1 capsule (100 mg total) by mouth 2 (two) times daily.     feeding supplement (ENSURE COMPLETE) Liqd  Take 237 mLs by mouth 2 (two) times daily between meals.     folic acid 1 MG tablet  Commonly known as:  FOLVITE  Take 1 tablet (1 mg total) by mouth daily.     gabapentin 100 MG capsule  Commonly known as:  NEURONTIN  Take 1 capsule (100 mg total) by mouth 3 (three) times daily.     glimepiride 2 MG tablet  Commonly known as:  AMARYL  Take 1 tablet (2 mg total) by mouth daily with breakfast.     glucose blood test strip  Commonly known as:  ACCU-CHEK ACTIVE STRIPS  Use as instructed     guaiFENesin  600 MG 12 hr tablet  Commonly known as:  MUCINEX  Take 1 tablet (600 mg total) by mouth 2 (two) times daily.     levETIRAcetam 500 MG tablet  Commonly known as:  KEPPRA  Take 500 mg by mouth 2 (two) times daily. One half tablet twice a day for 2 weeks, then take one tablet twice a day     levofloxacin 750 MG tablet  Commonly known as:  LEVAQUIN  Take 1 tablet (750 mg total) by mouth daily.     loratadine 10 MG tablet  Commonly known as:  CLARITIN  Take 1 tablet (10 mg total) by mouth daily as needed for allergies.     magnesium oxide 400 (241.3 MG) MG tablet  Commonly known as:  MAG-OX  Take 1 tablet (400 mg total) by mouth 2 (two) times daily.     omeprazole 20 MG capsule  Commonly known as:  PRILOSEC  Take 1 capsule (20 mg total) by mouth daily.      ondansetron 8 MG tablet  Commonly known as:  ZOFRAN  Take 1 tablet (8 mg total) by mouth every 8 (eight) hours as needed for nausea or vomiting (start on the 3rd day after chemotherapy.).     thiamine 100 MG tablet  Take 1 tablet (100 mg total) by mouth daily.        Discharge Instructions: Please refer to Patient Instructions section of EMR for full details.  Patient was counseled important signs and symptoms that should prompt return to medical care, changes in medications, dietary instructions, activity restrictions, and follow up appointments.   Follow-Up Appointments: Follow-up Information   Follow up with [facility] On 02/18/2014. (9:00am, For hospital follow-up)       Follow up with Howard Pouch, DO On 02/16/2014. (10:45AM, For hospital follow-up)    Specialty:  Family Medicine   Contact information:   [place] [id number] [place], DO 02/12/2014, 12:49 AM PGY-1, [place]

## 2014-02-10 NOTE — Progress Notes (Signed)
ANTIBIOTIC CONSULT NOTE - INITIAL  Pharmacy Consult for Vancomycin  Indication: rule out pneumonia  Allergies  Allergen Reactions  . Vicodin [Hydrocodone-Acetaminophen] Itching    Patient Measurements: Weight: 160 lb 8 oz (72.802 kg)  Vital Signs: Temp: 98.2 F (36.8 C) (06/04 0100) Temp src: Oral (06/04 0100) BP: 134/83 mmHg (06/04 0100) Pulse Rate: 101 (06/04 0100) Intake/Output from previous day: 06/03 0701 - 06/04 0700 In: -  Out: 525 [Urine:525] Intake/Output from this shift: Total I/O In: -  Out: 525 [Urine:525]  Labs:  Recent Labs  02/09/14 1900  WBC 5.5  HGB 13.4  PLT 74*  CREATININE 0.65   The CrCl is unknown because both a height and weight (above a minimum accepted value) are required for this calculation. No results found for this basename: VANCOTROUGH, VANCOPEAK, VANCORANDOM, GENTTROUGH, GENTPEAK, GENTRANDOM, TOBRATROUGH, TOBRAPEAK, TOBRARND, AMIKACINPEAK, AMIKACINTROU, AMIKACIN,  in the last 72 hours   Microbiology: No results found for this or any previous visit (from the past 720 hour(s)).  Medical History: Past Medical History  Diagnosis Date  . Atrial fibrillation   . Diabetes mellitus   . 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 Allergy   . Substance abuse   . Depression   . Stroke     3 previous strokes - Oct, Nov, and Dec 2012  . Throat cancer     September 2008  . Lung cancer     lobectomy in 2010  . Heart murmur   . Hx of radiation therapy 05/18/13- 06/01/13    whole brain, 30 Gy, 10 fractions    Medications:  Prescriptions prior to admission  Medication Sig Dispense Refill  . docusate sodium (COLACE) 100 MG capsule Take 100 mg by mouth 2 (two) times daily.      . folic acid (FOLVITE) 1 MG tablet Take 1 tablet (1 mg total) by mouth daily.  30 tablet  2  . gabapentin (NEURONTIN) 100 MG capsule Take 1 capsule (100 mg total) by mouth 3 (three) times daily.  90 capsule  2  . glimepiride (AMARYL) 2 MG tablet Take 1 tablet (2 mg total) by  mouth daily with breakfast.  30 tablet  0  . guaiFENesin (MUCINEX) 600 MG 12 hr tablet Take by mouth 2 (two) times daily.      Marland Kitchen levETIRAcetam (KEPPRA) 500 MG tablet Take 500 mg by mouth 2 (two) times daily. One half tablet twice a day for 2 weeks, then take one tablet twice a day      . loratadine (CLARITIN) 10 MG tablet Take 1 tablet (10 mg total) by mouth daily as needed for allergies.  30 tablet  0  . magnesium oxide (MAG-OX) 400 (241.3 MG) MG tablet Take 1 tablet (400 mg total) by mouth 2 (two) times daily.  60 tablet  0  . omeprazole (PRILOSEC) 20 MG capsule Take 20 mg by mouth daily.      . ondansetron (ZOFRAN) 8 MG tablet Take 1 tablet (8 mg total) by mouth every 8 (eight) hours as needed for nausea or vomiting (start on the 3rd day after chemotherapy.).  30 tablet  1  . thiamine 100 MG tablet Take 1 tablet (100 mg total) by mouth daily.  30 tablet  2  . Blood Glucose Monitoring Suppl (ACCU-CHEK NANO SMARTVIEW) W/DEVICE KIT Measure blood sugar once daily prior to taking amaryl in the morning.  1 kit  0  . feeding supplement, ENSURE COMPLETE, (ENSURE COMPLETE) LIQD Take 237 mLs by mouth  2 (two) times daily between meals.  30 Bottle  0  . glucose blood (ACCU-CHEK ACTIVE STRIPS) test strip Use as instructed  100 each  12  . Lancets (ACCU-CHEK MULTICLIX) lancets Use as instructed  100 each  12   Assessment: 53 yo male with cough/fever, possible PNA, for empiric antibiotics  Vancomycin 1 g IV given in ED at midnight  Goal of Therapy:  Vancomycin trough level 15-20 mcg/ml  Plan:  Vancomycin 1250 mg IV q12h  Bronson Curb Zani Kyllonen 02/10/2014,4:19 AM

## 2014-02-10 NOTE — Progress Notes (Signed)
Utilization review completed. Deondrea Aguado, RN, BSN. 

## 2014-02-10 NOTE — Evaluation (Addendum)
Occupational Therapy Evaluation Patient Details Name: William Townsend MRN: [id number] DOB: 1961-05-31 Today's Date: 02/10/2014    History of Present Illness Pt adm with weakness, syncope, and PNA. Pt also with etoh abuse which he reports has incr recently. 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 He also has a history of multiple CVAs.   Clinical Impression   Patient evaluated by Occupational Therapy with no further acute OT needs identified. All education has been completed and the patient has no further questions. See below for any follow-up Occupational Therapy or equipment needs. OT to sign off. Thank you for referral.      Follow Up Recommendations  Home health OT    Equipment Recommendations       Recommendations for Other Services       Precautions / Restrictions Precautions Precautions: Fall      Mobility Bed Mobility                  Transfers Overall transfer level: Needs assistance   Transfers: Sit to/from Stand Sit to Stand: Supervision              Balance Overall balance assessment: Needs assistance Sitting-balance support: No upper extremity supported;Feet supported Sitting balance-Leahy Scale: Normal     Standing balance support: During functional activity Standing balance-Leahy Scale: Fair                              ADL Overall ADL's : At baseline Pt ambulated > 200 feet during session per patient request using iv pole for stability. Discussed the need to use RW at home due to balance deficits. Pt agreeable. Pt demonstrates ability to perform adls at this time with balance deficits. Pt PTA with balance deficits. Pt is at baseline per his report. Pt states " yesterday was bad but today I am 100x better"                                       General ADL Comments: Pt reports being at baseline for adls. pt able to reach bil LE. pt ambulated to and from bathroom mod I using IV pole for balance.     Vision                     Perception     Praxis      Pertinent Vitals/Pain VSS HR 101 during mobility     Hand Dominance Right   Extremity/Trunk Assessment Upper Extremity Assessment Upper Extremity Assessment: Overall WFL for tasks assessed   Lower Extremity Assessment Lower Extremity Assessment: Defer to PT evaluation   Cervical / Trunk Assessment Cervical / Trunk Assessment: Normal   Communication Communication Communication: No difficulties   Cognition Arousal/Alertness: Awake/alert Behavior During Therapy: WFL for tasks assessed/performed Overall Cognitive Status: Within Functional Limits for tasks assessed                     General Comments       Exercises       Shoulder Instructions      Home Living Family/patient expects to be discharged to:: Private residence Living Arrangements: Non-relatives/Friends Available Help at Discharge: Available PRN/intermittently  Type of Home: House Home Access: Stairs to enter [employer] of Steps: 3   Home Layout: One level     Bathroom Shower/Tub: Walk-in shower         Home Equipment: Environmental consultant - 2 wheels          Prior Functioning/Environment Level of Independence: Independent with assistive device(s)             OT Diagnosis:     OT Problem List:     OT Treatment/Interventions:      OT Goals(Current goals can be found in the care plan section) Acute Rehab OT Goals Patient Stated Goal: pt wants to go home to address "things'. pt wants to return to [place] for week or so then find an apartment in [place]. Pt also [place]  OT Frequency:     Barriers to D/C:            Co-evaluation              End of Session Nurse  Communication: Mobility status;Precautions  Activity Tolerance: Patient tolerated treatment well Patient left: in bed;with family/visitor present;with call bell/phone within reach   Time: 1458-1513 OT Time Calculation (min): 15 min Charges:  OT General Charges $OT Visit: 1 Procedure OT Evaluation $Initial OT Evaluation Tier I: 1 Procedure OT Treatments $Self Care/Home Management : 8-22 mins G-Codes:    Defer all further therapy to home health level for high balance training.   Peri Maris 02/10/2014, 3:33 PM Pager: [phone]

## 2014-02-10 NOTE — Evaluation (Signed)
Physical Therapy Evaluation Patient Details Name: William Townsend MRN: [id number] DOB: July 26, 1961 Today's Date: 02/10/2014   History of Present Illness  Pt adm with weakness, syncope, and PNA. Pt also with etoh abuse which he reports has incr recently. 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 He also has a history of multiple CVAs.  Clinical Impression  Pt admitted with above. Pt currently with functional limitations due to the deficits listed below (see PT Problem List).  Pt will benefit from skilled PT to increase their independence and safety with mobility to allow discharge to the venue listed below. Pt expressed desire to stop chemo but stated family is pressuring him to continue.      Follow Up Recommendations SNF    Equipment Recommendations  None recommended by PT    Recommendations for Other Services Other (comment) (palliative medicine)     Precautions / Restrictions Precautions Precautions: Fall      Mobility  Bed Mobility                  Transfers Overall transfer level: Needs assistance Equipment used: None Transfers: Sit to/from Stand Sit to Stand: Min guard         General transfer comment: assist for balance  Ambulation/Gait Ambulation/Gait assistance: Min guard Ambulation Distance (Feet): 250 Feet Assistive device: None (pushing IV pole) Gait Pattern/deviations: Step-through pattern;Decreased step length - right;Decreased step length - left;Decreased stride length   Gait velocity interpretation: Below normal speed for age/gender General Gait Details: Pt with slightly unsteady gait which is better when using IV pole for stability. Pt caught foot on object in the room and pt reports this is problem at home due to his declining vision.  Stairs            Wheelchair  Mobility    Modified Rankin (Stroke Patients Only)       Balance Overall balance assessment: Needs assistance Sitting-balance support: No upper extremity supported;Feet supported Sitting balance-Leahy Scale: Normal     Standing balance support: No upper extremity supported Standing balance-Leahy Scale: Fair                               Pertinent Vitals/Pain VSS    Home Living Family/patient expects to be discharged to:: Private residence Living Arrangements: Non-relatives/Friends Available Help at Discharge: Available PRN/intermittently Type of Home: House Home Access: Stairs to enter   Technical brewer of Steps: 3 Home Layout: One level Home Equipment: Environmental consultant - 2 wheels      Prior Function Level of Independence: Independent with assistive device(s)         Comments: Pt reports he was doing this until a few days prior to adm     Hand Dominance   Dominant Hand: Right    Extremity/Trunk Assessment   Upper Extremity Assessment: Defer to OT evaluation           Lower Extremity Assessment: Generalized weakness         Communication   Communication: No difficulties  Cognition Arousal/Alertness: Awake/alert Behavior During Therapy: WFL for tasks assessed/performed Overall Cognitive Status: Within Functional Limits for tasks assessed                      General Comments  Exercises        Assessment/Plan    PT Assessment Patient needs continued PT services  PT Diagnosis Difficulty walking   PT Problem List Decreased balance;Decreased mobility  PT Treatment Interventions DME instruction;Gait training;Patient/family education;Functional mobility training;Therapeutic activities;Balance training;Therapeutic exercise   PT Goals (Current goals can be found in the Care Plan section) Acute Rehab PT Goals Patient Stated Goal: Pt stated he wanted to go somewhere with more support. PT Goal Formulation: With patient Time  For Goal Achievement: 02/17/14 Potential to Achieve Goals: Good    Frequency Min 3X/week   Barriers to discharge Decreased caregiver support      Co-evaluation               End of Session Equipment Utilized During Treatment: Gait belt Activity Tolerance: Patient tolerated treatment well Patient left: in chair;with call bell/phone within reach;with chair alarm set Nurse Communication: Mobility status         Time: 3710-6269 PT Time Calculation (min): 18 min   Charges:   PT Evaluation $Initial PT Evaluation Tier I: 1 Procedure PT Treatments $Gait Training: 8-22 mins   PT G CodesShary Decamp Autumn Gunn 02/10/2014, 11:37 AM  Suanne Marker PT [phone]

## 2014-02-11 ENCOUNTER — Telehealth: Payer: Self-pay | Admitting: Internal Medicine

## 2014-02-11 DIAGNOSIS — Z515 Encounter for palliative care: Secondary | ICD-10-CM

## 2014-02-11 DIAGNOSIS — Z85118 Personal history of other malignant neoplasm of bronchus and lung: Secondary | ICD-10-CM

## 2014-02-11 DIAGNOSIS — F1011 Alcohol abuse, in remission: Secondary | ICD-10-CM

## 2014-02-11 DIAGNOSIS — E43 Unspecified severe protein-calorie malnutrition: Secondary | ICD-10-CM

## 2014-02-11 DIAGNOSIS — R5383 Other fatigue: Secondary | ICD-10-CM

## 2014-02-11 DIAGNOSIS — J189 Pneumonia, unspecified organism: Principal | ICD-10-CM

## 2014-02-11 DIAGNOSIS — R5381 Other malaise: Secondary | ICD-10-CM

## 2014-02-11 DIAGNOSIS — C3490 Malignant neoplasm of unspecified part of unspecified bronchus or lung: Secondary | ICD-10-CM

## 2014-02-11 LAB — CBC
HEMATOCRIT: 33.5 % — AB (ref 39.0–52.0)
Hemoglobin: 12.2 g/dL — ABNORMAL LOW (ref 13.0–17.0)
MCH: 37.4 pg — ABNORMAL HIGH (ref 26.0–34.0)
MCHC: 36.4 g/dL — ABNORMAL HIGH (ref 30.0–36.0)
MCV: 102.8 fL — AB (ref 78.0–100.0)
Platelets: 63 10*3/uL — ABNORMAL LOW (ref 150–400)
RBC: 3.26 MIL/uL — ABNORMAL LOW (ref 4.22–5.81)
RDW: 13.8 % (ref 11.5–15.5)
WBC: 3.1 10*3/uL — ABNORMAL LOW (ref 4.0–10.5)

## 2014-02-11 LAB — BASIC METABOLIC PANEL
BUN: 5 mg/dL — AB (ref 6–23)
CHLORIDE: 101 meq/L (ref 96–112)
CO2: 26 mEq/L (ref 19–32)
CREATININE: 0.74 mg/dL (ref 0.50–1.35)
Calcium: 8.4 mg/dL (ref 8.4–10.5)
GFR calc Af Amer: >90 mL/min (ref >90)
GFR calc non Af Amer: >90 mL/min (ref >90)
Glucose, Bld: 119 mg/dL — ABNORMAL HIGH (ref 70–99)
Potassium: 3.5 mEq/L — ABNORMAL LOW (ref 3.7–5.3)
Sodium: 138 mEq/L (ref 137–147)

## 2014-02-11 LAB — GLUCOSE, CAPILLARY
GLUCOSE-CAPILLARY: 178 mg/dL — AB (ref 70–99)
Glucose-Capillary: 180 mg/dL — ABNORMAL HIGH (ref 70–99)
Glucose-Capillary: 117 mg/dL — ABNORMAL HIGH (ref 70–99)

## 2014-02-11 MED ORDER — POTASSIUM CHLORIDE CRYS ER 20 MEQ PO TBCR
40.0000 meq | EXTENDED_RELEASE_TABLET | Freq: Two times a day (BID) | ORAL | Status: DC
  Administered 2014-02-11: 40 meq via ORAL
  Filled 2014-02-11: qty 2

## 2014-02-11 MED ORDER — LEVOFLOXACIN 750 MG PO TABS
750.0000 mg | ORAL_TABLET | Freq: Every day | ORAL | Status: DC
  Administered 2014-02-11: 750 mg via ORAL
  Filled 2014-02-11: qty 1

## 2014-02-11 MED ORDER — GUAIFENESIN ER 600 MG PO TB12
600.0000 mg | ORAL_TABLET | Freq: Two times a day (BID) | ORAL | Status: DC
Start: 2014-02-11 — End: 2014-02-11
  Filled 2014-02-11 (×2): qty 1

## 2014-02-11 MED ORDER — GLIMEPIRIDE 2 MG PO TABS: 2.0000 mg | ORAL_TABLET | Freq: Every day | ORAL | Status: DC

## 2014-02-11 MED ORDER — LORATADINE 10 MG PO TABS
10.0000 mg | ORAL_TABLET | Freq: Every day | ORAL | Status: DC | PRN
  Filled 2014-02-11: qty 1

## 2014-02-11 MED ORDER — OMEPRAZOLE 20 MG PO CPDR: 20.0000 mg | DELAYED_RELEASE_CAPSULE | Freq: Every day | ORAL | Status: DC

## 2014-02-11 MED ORDER — DOCUSATE SODIUM 100 MG PO CAPS: 100.0000 mg | ORAL_CAPSULE | Freq: Two times a day (BID) | ORAL | Status: DC

## 2014-02-11 MED ORDER — LORATADINE 10 MG PO TABS: 10.0000 mg | ORAL_TABLET | Freq: Every day | ORAL | Status: DC | PRN

## 2014-02-11 MED ORDER — GABAPENTIN 100 MG PO CAPS: 100.0000 mg | ORAL_CAPSULE | Freq: Three times a day (TID) | ORAL | Status: DC

## 2014-02-11 MED ORDER — LEVOFLOXACIN 750 MG PO TABS: 750.0000 mg | ORAL_TABLET | Freq: Every day | ORAL | Status: AC

## 2014-02-11 MED ORDER — GUAIFENESIN ER 600 MG PO TB12: 600.0000 mg | ORAL_TABLET | Freq: Two times a day (BID) | ORAL | Status: DC

## 2014-02-11 NOTE — Progress Notes (Signed)
Attending Addendum  I examined the patient and discussed the assessment and plan with Dr. Parks Ranger. I have reviewed the note and agree.  Patient is doing very well today.  No complaints. Desires discharge. Will return if needed, if Cx grow positive, do not anticipate this.  Lungs: clear, nml WOB. Plan: continue levaquin x 10 days course of Abx. rx for abx, dulcolax, prilosec, neurontin, mucinex, clairtin.     William Townsend, [place]

## 2014-02-11 NOTE — Progress Notes (Signed)
Progress Note from the Palliative Medicine Team at [facility]: I spoke with Mr. Siemen this morning and continued our discussion from yesterday. He tells me that he is not interesting in SNF rehab, even in the interim. He says he needs to go to his home in [place] to "handle his affairs." He tells me that he is planning on moving to an apartment here in [place] to be closer to his doctors and the hospital. He says that he spoke with Dr. Worthy Flank office yesterday to schedule an appointment to discuss further treatment - he wishes to continue chemotherapy if offered for now (says he may consider stopping if it "makes me feel too bad"). He also tells me that he has considered his options for code status and now believes that he does not want to go through CPR/intubation and will accept this as his time to die when this time comes. He does not want to suffer. He gets stuck on his prognosis that was given of 12 months at some point and sees this as a countdown. This is what made him consider full code as he sees this "deadline" becoming closer he feels like he wants more time. He tells me today that if he only has a few months left - he is okay with that. I encouraged him to take this prognosis with a grain of salt - we know this will take his life but nobody can know exactly when this will happen. He verbalizes understanding and agrees to just take one day at a time. I have placed golden DNR on chart.     Objective: Allergies  Allergen Reactions  . Vicodin [Hydrocodone-Acetaminophen] Itching   Scheduled Meds: . ceFEPime (MAXIPIME) IV  2 g Intravenous Q12H  . docusate sodium  100 mg Oral BID  . feeding supplement (ENSURE COMPLETE)  237 mL Oral BID BM  . folic acid  1 mg Oral Daily  . gabapentin  100 mg Oral TID  . insulin aspart  0-15 Units Subcutaneous TID WC  . insulin aspart  0-5 Units Subcutaneous QHS  . levETIRAcetam  500 mg Oral BID  . multivitamin with minerals  1 tablet  Oral Daily  . nicotine  14 mg Transdermal Daily  . pantoprazole  40 mg Oral Daily  . potassium chloride  40 mEq Oral BID  . sodium chloride  3 mL Intravenous Q12H  . thiamine  100 mg Oral Daily  . vancomycin  1,250 mg Intravenous Q12H   Continuous Infusions:  PRN Meds:.acetaminophen, acetaminophen, LORazepam, LORazepam, ondansetron (ZOFRAN) IV, ondansetron  BP 121/76  Pulse 74  Temp(Src) 98.1 F (36.7 C) (Oral)  Resp 18  Ht 6' 0.83" (1.85 m)  Wt 72.802 kg (160 lb 8 oz)  BMI 21.27 kg/m2  SpO2 99%   PPS: 50%     Intake/Output Summary (Last 24 hours) at 02/11/14 0915 Last data filed at 02/11/14 0800  Gross per 24 hour  Intake    840 ml  Output      0 ml  Net    840 ml      LBM: 02/10/14      Physical Exam:  General: NAD, alert, pleasant  HEENT: [place]/AT, no JVD, oral mucosa dry without exudate  Chest: No labored breathing, symmetric, room air CVS: RRR, S1 S2  Abdomen: Soft, NT, ND, +BS  Ext: MAE, no edema, warm to touch  Neuro: Alert, oriented x 3, follows commands   Labs: CBC    Component Value  Date/Time   WBC 3.1* 02/11/2014 0429   WBC 2.5* 01/21/2014 0928   RBC 3.26* 02/11/2014 0429   RBC 3.47* 01/21/2014 0928   HGB 12.2* 02/11/2014 0429   HGB 13.1 01/21/2014 0928   HCT 33.5* 02/11/2014 0429   HCT 37.2* 01/21/2014 0928   PLT 63* 02/11/2014 0429   PLT 174 01/21/2014 0928   MCV 102.8* 02/11/2014 0429   MCV 107.4* 01/21/2014 0928   MCH 37.4* 02/11/2014 0429   MCH 37.8* 01/21/2014 0928   MCHC 36.4* 02/11/2014 0429   MCHC 35.2 01/21/2014 0928   RDW 13.8 02/11/2014 0429   RDW 14.8* 01/21/2014 0928   LYMPHSABS 0.2* 02/09/2014 1900   LYMPHSABS 0.7* 01/21/2014 0928   MONOABS 0.4 02/09/2014 1900   MONOABS 0.3 01/21/2014 0928   EOSABS 0.0 02/09/2014 1900   EOSABS 0.0 01/21/2014 0928   BASOSABS 0.0 02/09/2014 1900   BASOSABS 0.0 01/21/2014 0928    BMET    Component Value Date/Time   NA 138 02/11/2014 0429   NA 144 01/21/2014 0928   K 3.5* 02/11/2014 0429   K 3.6 01/21/2014 0928   CL 101 02/11/2014  0429   CO2 26 02/11/2014 0429   CO2 23 01/21/2014 0928   GLUCOSE 119* 02/11/2014 0429   GLUCOSE 164* 01/21/2014 0928   BUN 5* 02/11/2014 0429   BUN 7.5 01/21/2014 0928   CREATININE 0.74 02/11/2014 0429   CREATININE 0.8 01/21/2014 0928   CREATININE 1.04 01/28/2012 1454   CALCIUM 8.4 02/11/2014 0429   CALCIUM 10.1 01/21/2014 0928   GFRNONAA >90 02/11/2014 0429   GFRAA >90 02/11/2014 0429    CMP     Component Value Date/Time   NA 138 02/11/2014 0429   NA 144 01/21/2014 0928   K 3.5* 02/11/2014 0429   K 3.6 01/21/2014 0928   CL 101 02/11/2014 0429   CO2 26 02/11/2014 0429   CO2 23 01/21/2014 0928   GLUCOSE 119* 02/11/2014 0429   GLUCOSE 164* 01/21/2014 0928   BUN 5* 02/11/2014 0429   BUN 7.5 01/21/2014 0928   CREATININE 0.74 02/11/2014 0429   CREATININE 0.8 01/21/2014 0928   CREATININE 1.04 01/28/2012 1454   CALCIUM 8.4 02/11/2014 0429   CALCIUM 10.1 01/21/2014 0928   PROT 6.0 02/10/2014 0302   PROT 6.9 01/21/2014 0928   ALBUMIN 3.0* 02/10/2014 0302   ALBUMIN 3.6 01/21/2014 0928   AST 49* 02/10/2014 0302   AST 37* 01/21/2014 0928   ALT 21 02/10/2014 0302   ALT 40 01/21/2014 0928   ALKPHOS 130* 02/10/2014 0302   ALKPHOS 142 01/21/2014 0928   BILITOT 1.5* 02/10/2014 0302   BILITOT 0.68 01/21/2014 0928   GFRNONAA >90 02/11/2014 0429   GFRAA >90 02/11/2014 0429     Assessment and Plan: 1. Code Status: DNR 2. Symptom Control:  1. Anxiety/Agitation: Lorazepam per CIWA protocol.  2. Pain: Gabapentin for neuropathic foot pain.  3. Bowel Regimen: Colace scheduled.  4. Fever: Acetaminophen prn.  5. Nausea/Vomiting: Ondansetron prn.  3. Psycho/Social: Emotional support provided to patient during difficult conversation.  4. Disposition: Home with home health.    Time In Time Out Total Time Spent with Patient Total Overall Time  0915 0945 34min 69min    Greater than 50%  of this time was spent counseling and coordinating care related to the above assessment and plan.  Vinie Sill, NP Palliative Medicine Team Pager #  [phone] (M-F 8a-5p) Team Phone # [phone] (Nights/Weekends)

## 2014-02-11 NOTE — Telephone Encounter (Signed)
s.w.l with the desk nurse @ [facility] and they will gv pt appt for 6.12

## 2014-02-11 NOTE — Progress Notes (Signed)
Family Medicine Teaching Service Daily Progress Note Intern Pager: [phone]  Patient name: William Townsend Medical record number: [id number] Date of birth: 22-May-1961 Age: 53 y.o. Gender: male  Primary Care Provider: [name], DO Consultants: Palliative Code Status: DNR (decision switched following Palliative conversation)  Pt Overview and Major Events to Date:   6/4 - Admitted for RML HCAP, Vanc / Cefepime IV 6/5 - BCx (NGTD x 24 hrs), afebrile, stable WBC, clinically improved, start Levaquin 737m daily  Assessment and Plan: SMIKEY MAFFETTis a 53y.o. male presenting with 2 days of productive cough, fever/chills, generalized weakness / dizziness, found to have HCAP on Chest CT . 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  # RML HCAP - Improving - Found RML infiltrate on CXR / Chest CT, considered HCAP with recent admission and current chemotherapy - Currently dramatically improved, no O2 req, stable vitals, afebrile - vitals per protocol, check pulse ox with vitals, and ambulation - WBC 4.7-->3.1 decreased - Blood culture (NGTD x 24 hrs) - Discontinue empiric IV antibiotics, Vancomycin / Cefepime (completed 1 day) - Start Levaquin 7518mPO daily (Day 2 abx), total antibiotic course x 10 days, last dose on 02/19/14 - incentive spiro  - f/u fever curve, Tylenol PRN - Resume home mucinex, loratadine  # Dehydration / Generalized Weakness / Syncope - Improved - Recent h/o multiple pre-syncopal / syncopal episodes. On admission endorsed dizziness, most recent syncopal episodes with fall, persistent weakness. Suspect large component due to poor PO and dehydration, +tachy 150s on admission, improved s/p 2L IVF bolus - Currently dramatically improved,  tolerating ambulation unassisted, improved PO intake - DC IVF rehydration - Negative orthostatic vitals - PT/OT - SNF vs HHPT  # Nausea / Abdominal Pain - Resolved  - Recent h/o poor PO intake, nausea, generalized abdominal pain. Notable elevated alk phos (chronic), and elevated T.Bili 2.6. Initial concern for possible gallbladder disease with cholecystitis, however afebrile, minimal RUQ pain on admission, however CT Abd (negative, without acute findings) - Zofran PRN  - Protonix PO  #Stage IV small cell lung CA with brain, liver, and spleen mets  - Followed by Dr. MoJulien Nordmanncurrently undergoing palliative chemotherapy (last treatment 3 weeks ago?), undecided if wants to continue. Recent poor reaction to chemo. CT chest/abd/pelvis from appt (4/30) showing disease progression the lung as well as in the liver and abdomen.  - continue home Keppra 50044mID  # chronic Anemia, thrombocytopenia: secondary to chemotherapy.  #History of CVA x3 in 2012  - CT head negative for acute findings, stable from prior  - s/p brain surgery, poor anti-platelet candidate  #T2DM  - found to have hyperglycemia to 267. Last A1c 5/15 with 7.3  - hold home Amaryl 2mg14mily  - mod SSI  - CBG q 4 AC + HS - Gabapentin 100mg24m for peripheral neuropathy  #hx of HTN  - normotensive  - monitor, not on home anti-HTNs  #Alcohol abuse:  - Persistent EtOH use, recently increased, drinks only Vodka, admits to half gallon in 4 days, last drink 2 days ago  - CIWA protcol  - Thiamine 100mg 18my  - folate  - c/s Nutrition re: poor PO intake, EtOH abuse - Continue Ensure Complete PO BID  #Tobacco abuse:  - currently an 1/2  PPD smoker  - nicotine patch 97m  # Hypokalemia  - K 3.5 - replace with PO 40 mEq  - trend bmet  FEN/GI: Soft diet, SLIV Prophylaxis: SCDs  [facility]: [facility], transitioned to PO antibiotics, anticipate discharge with HHPT arrangements tomorrow  02/12/14. - [facility] for [facility] for [facility]\ - Palliative care consult greatly appreciated, good discussion with patient, future plans for ALF. Decision currently for DNR.  Subjective:  Patient walking in hallway. Feels much better, states he is ready to go home. Breathing and weakness dramatically improved. Good appetite with improved PO intake. Tolerating ambulation w/o dizziness.  Objective: Temp:  [98.1 F (36.7 C)-98.4 F (36.9 C)] 98.1 F (36.7 C) (06/05 0456) Pulse Rate:  [74] 74 (06/04 1350) Resp:  [18] 18 (06/04 1350) BP: (121)/(76) 121/76 mmHg (06/04 1350) SpO2:  [99 %] 99 % (06/04 1350) Physical Exam: General: thin, pleasant, cooperative, NAD Cardiovascular: RRR, no murmurs Respiratory: Mostly CTAB, mild bibasilar rhonchi improved with cough, good air movement, no increased WOB Abdomen: soft, NTND, +active BS Extremities: moves all ext, non-tender no edema Neuro - AAOx3, grossly non-focal, normal gait  Laboratory:  Recent Labs Lab 02/09/14 1900 02/10/14 0302 02/11/14 0429  WBC 5.5 4.7 3.1*  HGB 13.4 11.2* 12.2*  HCT 36.7* 31.1* 33.5*  PLT 74* 68* 63*    Recent Labs Lab 02/09/14 1900 02/10/14 0302 02/11/14 0429  NA 141 141 138  K 3.5* 3.2* 3.5*  CL 95* 98 101  CO2 _0 BUN 10 7 5*  CREATININE 0.65 0.66 0.74  CALCIUM 8.7 7.8* 8.4  PROT 6.9 6.0  --   BILITOT 2.6* 1.5*  --   ALKPHOS 173* 130*  --   ALT 27 21  --   AST 61* 49*  --   GLUCOSE 267* 108* 119*   Trop (negative)  Lipase - 23  Elevated T.Bili - 2.6  UA - spec grav 1.029, ketone 40, neg nit, neg LE, 0-2 WBC  6/3 Blood Culture x 2 (NGTD x 24 hours)  Imaging/Diagnostic Tests:  6/3 CXR 2v  IMPRESSION:  1. A left upper lobe infiltrate or cavitary mass is suspected. This  may reflect infectious or neoplastic processes. A patchy infiltrate  in the right infrahilar region is present.  2. There is no evidence of CHF nor pleural effusion.   3. Chest CT scanning is recommended.  6/3 Head CT w/o contrast  IMPRESSION:  No acute finding. Stable compared to prior exam.  Status post left occipital lobe craniotomy for resection of a left  occipital lobe metastasis.  Colloid cyst, unchanged.  6/3 CT Chest  IMPRESSION:  1. No evidence of a pulmonary embolus.  2. New area of peribronchovascular opacity in the right middle lobe.  Although this could reflect metastatic disease, the pattern is more  suggestive of infection or inflammation.  3. Focal masslike opacity in the left upper lobe is stable from the  prior CT. Several tiny lung nodules are stable. One nodule measuring  3 mm in the right lower lobe may be new, although the difference  between the current the prior study could be due to slice selection  differences.  4. Posterior inferior right periaortic mediastinal lymph node has  mildly increased in size from the prior study.  5. Liver metastatic disease appears stable, not completely evaluated  on this exam.  6/4 CT Abd / Pelvis  IMPRESSION:  1.  No acute intra-abdominal findings.  2. Right middle lobe pneumonia.  3. Known hepatic and nodal metastatic disease.   [name], DO 02/11/2014, 11:48 AM PGY-1, [place] Intern pager: [phone], text pages welcome

## 2014-02-11 NOTE — Progress Notes (Signed)
CARE MANAGEMENT NOTE 02/11/2014  Patient:  William Townsend, William Townsend   Account Number:  [id number]  Date Initiated:  02/10/2014  Documentation initiated by:  AMERSON,JULIE  Subjective/Objective Assessment:   Pt adm on 6/3 with dehydration, Afib.  PTA, pt lives with roommate and rooommate's wife.  Pt has CA; just finished chemo.     Action/Plan:   PT recommending SNF at dc; CSW consulted to facilitate poss dc to SNF when medically stable.   Anticipated DC Date:  02/14/2014   Anticipated DC Plan:  SKILLED NURSING FACILITY  In-house referral  Clinical Social Worker      DC Planning Services  CM consult      Choice offered to / List presented to:             Status of service:  Completed, signed off Medicare Important Message given?   (If response is "NO", the following Medicare IM given date fields will be blank) Date Medicare IM given:   Date Additional Medicare IM given:    Discharge Disposition:  HOME/SELF CARE  Per UR Regulation:  Reviewed for med. necessity/level of care/duration of stay  If discussed at [place] of Stay Meetings, dates discussed:    Comments:  No NCM needs identified. Pt refuses SNF. Jonnie Finner RN CCM Case Mgmt phone [phone]

## 2014-02-11 NOTE — Progress Notes (Signed)
CSW met with pt who informed CSW that he has decided to return home to [place] and is declining SNF placement. Pt has agreed to contact CSW at the [facility] at dc so that CSW could assist with a personal care application for a home aide. No additional CSW needs, CSW signing off.  Norma Wilson, MSW, LCSW [phone]  

## 2014-02-12 NOTE — Discharge Summary (Signed)
Attending Addendum  I examined the patient and discussed the discharge plan with Dr. Parks Ranger. I have reviewed the note and agree.    William Townsend, [place]

## 2014-02-14 ENCOUNTER — Telehealth: Payer: Self-pay | Admitting: Family Medicine

## 2014-02-14 DIAGNOSIS — C3490 Malignant neoplasm of unspecified part of unspecified bronchus or lung: Secondary | ICD-10-CM

## 2014-02-14 DIAGNOSIS — F1011 Alcohol abuse, in remission: Secondary | ICD-10-CM

## 2014-02-14 DIAGNOSIS — E119 Type 2 diabetes mellitus without complications: Secondary | ICD-10-CM

## 2014-02-14 MED ORDER — THIAMINE HCL 100 MG PO TABS: 100.0000 mg | ORAL_TABLET | Freq: Every day | ORAL | Status: DC

## 2014-02-14 MED ORDER — MAGNESIUM OXIDE 400 (241.3 MG) MG PO TABS: 400.0000 mg | ORAL_TABLET | Freq: Two times a day (BID) | ORAL | Status: DC

## 2014-02-14 MED ORDER — FOLIC ACID 1 MG PO TABS: 1.0000 mg | ORAL_TABLET | Freq: Every day | ORAL | Status: DC

## 2014-02-14 MED ORDER — GLIMEPIRIDE 2 MG PO TABS: 2.0000 mg | ORAL_TABLET | Freq: Every day | ORAL | Status: DC

## 2014-02-14 NOTE — Telephone Encounter (Signed)
Forward to PCP for refill.William Townsend

## 2014-02-14 NOTE — Telephone Encounter (Signed)
Pt need refills on his Thiamine 100 tabs, Magnesium oxide,folic acid and the glimepiride.  Please sent to [facility].

## 2014-02-14 NOTE — Telephone Encounter (Signed)
Refills sent

## 2014-02-16 ENCOUNTER — Encounter: Payer: Self-pay | Admitting: Clinical

## 2014-02-16 ENCOUNTER — Ambulatory Visit (INDEPENDENT_AMBULATORY_CARE_PROVIDER_SITE_OTHER): Payer: Medicaid Other | Admitting: Family Medicine

## 2014-02-16 ENCOUNTER — Encounter: Payer: Self-pay | Admitting: Family Medicine

## 2014-02-16 VITALS — BP 96/65 | HR 107 | Temp 97.9°F | Ht 73.0 in | Wt 161.0 lb

## 2014-02-16 DIAGNOSIS — G9389 Other specified disorders of brain: Secondary | ICD-10-CM

## 2014-02-16 DIAGNOSIS — G939 Disorder of brain, unspecified: Secondary | ICD-10-CM

## 2014-02-16 LAB — CULTURE, BLOOD (ROUTINE X 2)
Culture: NO GROWTH
Culture: NO GROWTH

## 2014-02-16 NOTE — Progress Notes (Signed)
   Subjective:    Patient ID: William Townsend, male    DOB: 03/29/1961, 53 y.o.   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   Pneumonia: Patient recently discharged for pneumonia. Place on home antibiotics of Levaquin. He states he is feeling much better, and is taking his antibiotics as directed. He denies fever, shortness of breath or increased fatigue.   Brain cancer: Patient has decided to restart palliative chemotherapy treatment. He has been in touch with Dr. Lew Dawes office and set up his next appointment. At this time he declines assisted living facility, and remains at home in the care her friends and extended family. He states he may get an apartment in [place] so that he is closer to all of his physicians, however this would be on his own. He currently is taking up to "16" medications that he is attempting to keep straight. He states he has a pillbox but uncertain if he is using it correctly. He is open to home health assistance in the home and possibly a personal care assistant.  Review of Systems Negative, with the exception of above mentioned in HPI    Objective:   Physical Exam BP 96/65  Pulse 107  Temp(Src) 97.9 F (36.6 C) (Oral)  Ht 6\' 1"  (1.854 m)  Wt 161 lb (73.029 kg)  BMI 21.25 kg/m2 Gen: Appears ill and thin. No acute distress. Pleasant Caucasian male. HEENT: AT. [place].  Bilateral eyes without injections or icterus. MMM. CV: Tachycardic, regular rhythm Chest: CTAB, no wheeze or crackles

## 2014-02-16 NOTE — Assessment & Plan Note (Signed)
referral for home health for nursing and PT. Will fill out form her personal care services as well.

## 2014-02-16 NOTE — Progress Notes (Signed)
PCS form has been faxed to [facility] who will contact pt for a home visit.  Hunt Oris, MSW, [place]

## 2014-02-16 NOTE — Patient Instructions (Signed)

## 2014-02-16 NOTE — Progress Notes (Signed)
PCS form given to pt PCP for completion.  Hunt Oris, MSW, [place]

## 2014-02-16 NOTE — Progress Notes (Signed)
CSW met with pt to discuss William Townsend and PCS services. Pt was agreeable to a HHRN to assist with medications and a PCS form to be completed so that pt can receive an aide at home.  Pt agreeable to call CSW when he moves to [place].  Hunt Oris, MSW, [place]

## 2014-02-18 ENCOUNTER — Ambulatory Visit (HOSPITAL_BASED_OUTPATIENT_CLINIC_OR_DEPARTMENT_OTHER): Payer: Medicaid Other | Admitting: Internal Medicine

## 2014-02-18 ENCOUNTER — Telehealth: Payer: Self-pay | Admitting: Internal Medicine

## 2014-02-18 ENCOUNTER — Encounter: Payer: Self-pay | Admitting: Internal Medicine

## 2014-02-18 ENCOUNTER — Telehealth: Payer: Self-pay | Admitting: *Deleted

## 2014-02-18 ENCOUNTER — Other Ambulatory Visit: Payer: Medicaid Other

## 2014-02-18 ENCOUNTER — Ambulatory Visit (HOSPITAL_BASED_OUTPATIENT_CLINIC_OR_DEPARTMENT_OTHER): Payer: Medicaid Other

## 2014-02-18 VITALS — BP 152/82 | HR 88 | Temp 98.3°F | Resp 18 | Ht 73.0 in | Wt 163.2 lb

## 2014-02-18 DIAGNOSIS — C3490 Malignant neoplasm of unspecified part of unspecified bronchus or lung: Secondary | ICD-10-CM

## 2014-02-18 DIAGNOSIS — C787 Secondary malignant neoplasm of liver and intrahepatic bile duct: Secondary | ICD-10-CM

## 2014-02-18 DIAGNOSIS — Z85118 Personal history of other malignant neoplasm of bronchus and lung: Secondary | ICD-10-CM

## 2014-02-18 DIAGNOSIS — C7800 Secondary malignant neoplasm of unspecified lung: Secondary | ICD-10-CM

## 2014-02-18 DIAGNOSIS — R911 Solitary pulmonary nodule: Secondary | ICD-10-CM

## 2014-02-18 DIAGNOSIS — Z5111 Encounter for antineoplastic chemotherapy: Secondary | ICD-10-CM

## 2014-02-18 DIAGNOSIS — C3410 Malignant neoplasm of upper lobe, unspecified bronchus or lung: Secondary | ICD-10-CM

## 2014-02-18 LAB — COMPREHENSIVE METABOLIC PANEL (CC13)
ALK PHOS: 141 U/L (ref 40–150)
ALT: 46 U/L (ref 0–55)
ANION GAP: 10 meq/L (ref 3–11)
AST: 47 U/L — ABNORMAL HIGH (ref 5–34)
Albumin: 3.6 g/dL (ref 3.5–5.0)
BILIRUBIN TOTAL: 0.78 mg/dL (ref 0.20–1.20)
BUN: 9.7 mg/dL (ref 7.0–26.0)
CO2: 24 mEq/L (ref 22–29)
Calcium: 9.1 mg/dL (ref 8.4–10.4)
Chloride: 102 mEq/L (ref 98–109)
Creatinine: 1 mg/dL (ref 0.7–1.3)
Glucose: 322 mg/dl — ABNORMAL HIGH (ref 70–140)
Potassium: 4.2 mEq/L (ref 3.5–5.1)
SODIUM: 136 meq/L (ref 136–145)
Total Protein: 6.9 g/dL (ref 6.4–8.3)

## 2014-02-18 LAB — CBC WITH DIFFERENTIAL/PLATELET
BASO%: 0.4 % (ref 0.0–2.0)
Basophils Absolute: 0 10*3/uL (ref 0.0–0.1)
EOS%: 0.9 % (ref 0.0–7.0)
Eosinophils Absolute: 0 10*3/uL (ref 0.0–0.5)
HCT: 39.3 % (ref 38.4–49.9)
HGB: 13.8 g/dL (ref 13.0–17.1)
LYMPH%: 28 % (ref 14.0–49.0)
MCH: 37.4 pg — ABNORMAL HIGH (ref 27.2–33.4)
MCHC: 35.1 g/dL (ref 32.0–36.0)
MCV: 106.8 fL — ABNORMAL HIGH (ref 79.3–98.0)
MONO#: 0.9 10*3/uL (ref 0.1–0.9)
MONO%: 24.8 % — ABNORMAL HIGH (ref 0.0–14.0)
NEUT#: 1.6 10*3/uL (ref 1.5–6.5)
NEUT%: 45.9 % (ref 39.0–75.0)
PLATELETS: 158 10*3/uL (ref 140–400)
RBC: 3.68 10*6/uL — ABNORMAL LOW (ref 4.20–5.82)
RDW: 14.2 % (ref 11.0–14.6)
WBC: 3.6 10*3/uL — ABNORMAL LOW (ref 4.0–10.3)
lymph#: 1 10*3/uL (ref 0.9–3.3)

## 2014-02-18 MED ORDER — ONDANSETRON 8 MG/NS 50 ML IVPB
INTRAVENOUS | Status: AC
  Filled 2014-02-18: qty 8

## 2014-02-18 MED ORDER — SODIUM CHLORIDE 0.9 % IJ SOLN
10.0000 mL | INTRAMUSCULAR | Status: DC | PRN
  Administered 2014-02-18: 10 mL
  Filled 2014-02-18: qty 10

## 2014-02-18 MED ORDER — DEXAMETHASONE SODIUM PHOSPHATE 10 MG/ML IJ SOLN
INTRAMUSCULAR | Status: AC
  Filled 2014-02-18: qty 1

## 2014-02-18 MED ORDER — ATROPINE SULFATE 1 MG/ML IJ SOLN
INTRAMUSCULAR | Status: AC
  Filled 2014-02-18: qty 1

## 2014-02-18 MED ORDER — ONDANSETRON 8 MG/50ML IVPB (CHCC)
8.0000 mg | Freq: Once | INTRAVENOUS | Status: AC
  Administered 2014-02-18: 8 mg via INTRAVENOUS

## 2014-02-18 MED ORDER — SODIUM CHLORIDE 0.9 % IV SOLN
Freq: Once | INTRAVENOUS | Status: AC
  Administered 2014-02-18: 10:00:00 via INTRAVENOUS

## 2014-02-18 MED ORDER — DEXAMETHASONE SODIUM PHOSPHATE 10 MG/ML IJ SOLN
10.0000 mg | Freq: Once | INTRAMUSCULAR | Status: AC
  Administered 2014-02-18: 10 mg via INTRAVENOUS

## 2014-02-18 MED ORDER — HEPARIN SOD (PORK) LOCK FLUSH 100 UNIT/ML IV SOLN
500.0000 [IU] | Freq: Once | INTRAVENOUS | Status: AC | PRN
  Administered 2014-02-18: 500 [IU]
  Filled 2014-02-18: qty 5

## 2014-02-18 MED ORDER — IRINOTECAN HCL CHEMO INJECTION 100 MG/5ML
65.0000 mg/m2 | Freq: Once | INTRAVENOUS | Status: AC
  Administered 2014-02-18: 126 mg via INTRAVENOUS
  Filled 2014-02-18: qty 6.3

## 2014-02-18 MED ORDER — ATROPINE SULFATE 1 MG/ML IJ SOLN
0.5000 mg | Freq: Once | INTRAMUSCULAR | Status: AC | PRN
  Administered 2014-02-18: 0.5 mg via INTRAVENOUS

## 2014-02-18 NOTE — Telephone Encounter (Signed)
gave pt appt for lab ,md and chemo for June and July

## 2014-02-18 NOTE — Telephone Encounter (Signed)
Per staff message and POF I have scheduled appts. Advised scheduler of appts. JMW  

## 2014-02-18 NOTE — Patient Instructions (Signed)
[facility] Discharge Instructions for Patients Receiving Chemotherapy  Today you received the following chemotherapy agents Irinotecan.  To help prevent nausea and vomiting after your treatment, we encourage you to take your nausea medication as prescribed.   If you develop nausea and vomiting that is not controlled by your nausea medication, call the clinic.   BELOW ARE SYMPTOMS THAT SHOULD BE REPORTED IMMEDIATELY:  *FEVER GREATER THAN 100.5 F  *CHILLS WITH OR WITHOUT FEVER  NAUSEA AND VOMITING THAT IS NOT CONTROLLED WITH YOUR NAUSEA MEDICATION  *UNUSUAL SHORTNESS OF BREATH  *UNUSUAL BRUISING OR BLEEDING  TENDERNESS IN MOUTH AND THROAT WITH OR WITHOUT PRESENCE OF ULCERS  *URINARY PROBLEMS  *BOWEL PROBLEMS  UNUSUAL RASH Items with * indicate a potential emergency and should be followed up as soon as possible.  Feel free to call the clinic you have any questions or concerns. The clinic phone number is [phone].

## 2014-02-18 NOTE — Progress Notes (Signed)
[facility]  [phone] [phone]  PROGRESS NOTE  William Putnam, DO [place] [address]  DIAGNOSIS: Extensive Stage small cell lung cancer diagnosed in September of 2014.  PRIOR THERAPY:  1) Systemic chemotherapy with carboplatin for an AUC of 5 given on day 1 and etoposide 120 mg per meter squared given on days 1, 2 and 3 with Neulasta support given on day 4 status post 2 cycles. Carboplatin was reduced to AUC of 4 and etoposide 100 mg/M2 starting from cycle #6, last dose was 09/20/2013. 2) Systemic chemotherapy with cisplatin 30 mg/M2 and irinotecan 65 mg/M2 on days 1 and 8 every 3 weeks first dose today 01/06/2014. Cisplatin was discontinued after cycle #1 secondary to hypersensitivity reaction.  CURRENT THERAPY: Systemic chemotherapy with single agent irinotecan 65 mg/M2 on days 1 and 8 every 3 weeks. First dose 02/18/2014.  DISEASE STAGE: Extensive stage  CHEMOTHERAPY INTENT: Palliative  CURRENT # OF CHEMOTHERAPY CYCLES: 2 CURRENT ANTIEMETICS: Zofran, dexamethasone, Compazine  CURRENT SMOKING STATUS: Current smoker, strongly advised to quit smoking  ORAL CHEMOTHERAPY AND CONSENT: N./A.  CURRENT BISPHOSPHONATES USE: None  PAIN MANAGEMENT: Percocet  NARCOTICS INDUCED CONSTIPATION: None  LIVING WILL AND CODE STATUS: ?  INTERVAL HISTORY: William Townsend 53 y.o. male returns for a follow up visit. He was recently found to have evidence for disease progression. He was started on systemic chemotherapy with cisplatin and irinotecan but unfortunately has hypersensitivity reaction to cisplatin and this was discontinued. He was a little bit depressed recently and started drinking alcohol at regular basis. He was admitted to [facility] with healthcare acquired pneumonia. He was treated with vancomycin and cefepime.  He is feeling much better today and ready to resume her systemic chemotherapy. He denied having  any significant chest pain, shortness of breath, cough or hemoptysis. The patient has no nausea or vomiting. He has no fever or chills. He has no significant weight loss or night sweats. He continues to have occasional unbalanced gait.  MEDICAL HISTORY: Past Medical History  Diagnosis Date  . Atrial fibrillation   . Diabetes mellitus   . 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 Allergy   . Substance abuse   . Depression   . Stroke     3 previous strokes - Oct, Nov, and Dec 2012  . Throat cancer     September 2008  . Lung cancer     lobectomy in 2010  . Heart murmur   . Hx of radiation therapy 05/18/13- 06/01/13    whole brain, 30 Gy, 10 fractions    ALLERGIES:  is allergic to vicodin.  MEDICATIONS:  Current Outpatient Prescriptions  Medication Sig Dispense Refill  . Blood Glucose Monitoring Suppl (ACCU-CHEK NANO SMARTVIEW) W/DEVICE KIT Measure blood sugar once daily prior to taking amaryl in the morning.  1 kit  0  . docusate sodium (COLACE) 100 MG capsule Take 1 capsule (100 mg total) by mouth 2 (two) times daily.  20 capsule  0  . feeding supplement, ENSURE COMPLETE, (ENSURE COMPLETE) LIQD Take 237 mLs by mouth 2 (two) times daily between meals.  30 Bottle  0  . folic acid (FOLVITE) 1 MG tablet Take 1 tablet (1 mg total) by mouth daily.  30 tablet  2  . gabapentin (NEURONTIN) 100 MG capsule Take 1 capsule (100 mg total) by mouth 3 (three) times daily.  90 capsule  2  . glimepiride (AMARYL) 2 MG tablet Take 1 tablet (  2 mg total) by mouth daily with breakfast.  30 tablet  2  . glucose blood (ACCU-CHEK ACTIVE STRIPS) test strip Use as instructed  100 each  12  . guaiFENesin (MUCINEX) 600 MG 12 hr tablet Take 1 tablet (600 mg total) by mouth 2 (two) times daily.  30 tablet  0  . Lancets (ACCU-CHEK MULTICLIX) lancets Use as instructed  100 each  12  . levETIRAcetam (KEPPRA) 500 MG tablet Take 500 mg by mouth 2 (two) times daily. One half tablet twice a day for 2 weeks, then take one tablet  twice a day      . levofloxacin (LEVAQUIN) 750 MG tablet Take 1 tablet (750 mg total) by mouth daily.  8 tablet  0  . loratadine (CLARITIN) 10 MG tablet Take 1 tablet (10 mg total) by mouth daily as needed for allergies.  30 tablet  0  . magnesium oxide (MAG-OX) 400 (241.3 MG) MG tablet Take 1 tablet (400 mg total) by mouth 2 (two) times daily.  60 tablet  0  . omeprazole (PRILOSEC) 20 MG capsule Take 1 capsule (20 mg total) by mouth daily.  30 capsule  0  . ondansetron (ZOFRAN) 8 MG tablet Take 1 tablet (8 mg total) by mouth every 8 (eight) hours as needed for nausea or vomiting (start on the 3rd day after chemotherapy.).  30 tablet  1  . thiamine 100 MG tablet Take 1 tablet (100 mg total) by mouth daily.  30 tablet  2   No current facility-administered medications for this visit.   Facility-Administered Medications Ordered in Other Visits  Medication Dose Route Frequency Provider Last Rate Last Dose  . heparin lock flush 100 unit/mL  500 Units Intravenous Once Si Gaul, MD      . sodium chloride 0.9 % injection 10 mL  10 mL Intravenous PRN Si Gaul, MD   10 mL at 01/06/14 0830    SURGICAL HISTORY:  Past Surgical History  Procedure Laterality Date  . Lobectomy    . Craniotomy Left 04/27/2013    Procedure: Left Occipital Craniotomy for resection of tumor with microscope;  Surgeon: Karn Cassis, MD;  Location: [facility];  Service: Neurosurgery;  Laterality: Left;    REVIEW OF SYSTEMS:  Constitutional: positive for fatigue Eyes: negative Ears, nose, mouth, throat, and face: negative Respiratory: negative Cardiovascular: negative Gastrointestinal: negative Genitourinary:negative Integument/breast: negative Hematologic/lymphatic: negative Musculoskeletal:negative Neurological: negative Behavioral/Psych: negative Endocrine: negative Allergic/Immunologic: negative   PHYSICAL EXAMINATION: General appearance: alert, cooperative and no distress Head: Normocephalic,  without obvious abnormality, atraumatic Neck: no adenopathy, no carotid bruit, no JVD, supple, symmetrical, trachea midline and thyroid not enlarged, symmetric, no tenderness/mass/nodules Lymph nodes: Cervical, supraclavicular, and axillary nodes normal. Resp: clear to auscultation bilaterally Cardio: regular rate and rhythm, S1, S2 normal, no murmur, click, rub or gallop GI: soft, non-tender; bowel sounds normal; no masses,  no organomegaly Extremities: extremities normal, atraumatic, no cyanosis or edema Neurologic: Alert and oriented X 3, normal strength and tone. Normal symmetric reflexes. Normal coordination and gait  ECOG PERFORMANCE STATUS: 1 - Symptomatic but completely ambulatory  There were no vitals taken for this visit.  LABORATORY DATA: Lab Results  Component Value Date   WBC 3.6* 02/18/2014   HGB 13.8 02/18/2014   HCT 39.3 02/18/2014   MCV 106.8* 02/18/2014   PLT 158 02/18/2014      Chemistry      Component Value Date/Time   NA 138 02/11/2014 0429   NA 144 01/21/2014 2769  K 3.5* 02/11/2014 0429   K 3.6 01/21/2014 0928   CL 101 02/11/2014 0429   CO2 26 02/11/2014 0429   CO2 23 01/21/2014 0928   BUN 5* 02/11/2014 0429   BUN 7.5 01/21/2014 0928   CREATININE 0.74 02/11/2014 0429   CREATININE 0.8 01/21/2014 0928   CREATININE 1.04 01/28/2012 1454      Component Value Date/Time   CALCIUM 8.4 02/11/2014 0429   CALCIUM 10.1 01/21/2014 0928   ALKPHOS 130* 02/10/2014 0302   ALKPHOS 142 01/21/2014 0928   AST 49* 02/10/2014 0302   AST 37* 01/21/2014 0928   ALT 21 02/10/2014 0302   ALT 40 01/21/2014 0928   BILITOT 1.5* 02/10/2014 0302   BILITOT 0.68 01/21/2014 0928       RADIOGRAPHIC STUDIES:  ASSESSMENT/PLAN:  This is a very pleasant 53 years old white male with extensive stage small cell lung cancer status post 6 cycles of systemic chemotherapy with carboplatin and etoposide.  Unfortunately his recent CT scan of the chest, abdomen and pelvis showed evidence for disease progression in the lung  as well as the liver and abdomen. He was started on treatment with cisplatin and irinotecan but unfortunately developed hypersensitivity reaction to cisplatin and this was discontinued. I recommended for the patient to continue systemic chemotherapy with single agent irinotecan 65 mg/M2 on days 1 and 8 every 3 weeks. He'll start the first cycle of this treatment today. He would come back for followup visit in 3 weeks with the start of cycle #2 of this chemotherapy. For the history of seizure activity, the patient will continue his current treatment with Keppra. He was advised to call immediately if he has any concerning symptoms in the interval. All questions were answered. The patient knows to call the clinic with any problems, questions or concerns. We can certainly see the patient much sooner if necessary.   Disclaimer: This note was dictated with voice recognition software. Similar sounding words can inadvertently be transcribed and may not be corrected upon review.  Eilleen Kempf., MD 02/18/2014

## 2014-02-23 ENCOUNTER — Other Ambulatory Visit: Payer: Self-pay | Admitting: Radiation Therapy

## 2014-02-23 ENCOUNTER — Ambulatory Visit: Payer: Medicaid Other | Admitting: Family Medicine

## 2014-02-23 DIAGNOSIS — C7949 Secondary malignant neoplasm of other parts of nervous system: Principal | ICD-10-CM

## 2014-02-23 DIAGNOSIS — C7931 Secondary malignant neoplasm of brain: Secondary | ICD-10-CM

## 2014-02-25 ENCOUNTER — Other Ambulatory Visit: Payer: Medicaid Other

## 2014-02-25 ENCOUNTER — Ambulatory Visit: Payer: Medicaid Other

## 2014-02-28 ENCOUNTER — Other Ambulatory Visit: Payer: Medicaid Other

## 2014-03-01 ENCOUNTER — Telehealth: Payer: Self-pay | Admitting: Internal Medicine

## 2014-03-01 NOTE — Telephone Encounter (Signed)
s.w. pt and advised on friday appt...okd by Dr. Tyrell Antonio per staff msg

## 2014-03-02 ENCOUNTER — Ambulatory Visit: Payer: Medicaid Other | Admitting: Radiation Oncology

## 2014-03-02 NOTE — Consult Note (Signed)
I have reviewed and discussed the care of this patient in detail with the nurse practitioner including pertinent patient records, physical exam findings and data. I agree with details of this encounter.  

## 2014-03-03 ENCOUNTER — Other Ambulatory Visit: Payer: Medicaid Other

## 2014-03-03 ENCOUNTER — Ambulatory Visit: Admission: RE | Admit: 2014-03-03 | Payer: Medicaid Other | Source: Ambulatory Visit | Admitting: Radiation Oncology

## 2014-03-03 ENCOUNTER — Encounter: Payer: Self-pay | Admitting: Radiation Therapy

## 2014-03-03 NOTE — Progress Notes (Unsigned)
Mr. Furches was a no-show for his MRI this morning, so I called to check in. He did not answer and it is not set up for voicemail , so I could not leave a message. I was however, able to reach his daughter Joseph Art. Renee said she spoke to her father this morning and he had intentions of cancelling all future appointments and procedures. She said that he got some bad news from one of his physicians and received a bill in the mail for a CT scan that was done. He told her that he can't pay these bills if the insurance does not cover them.   Renee thanked me for my call and said that if she hears anything different from her father, she will let me know. For now, I have cancelled his follow-up for this afternoon and am waiting to hear back before rescheduling.    Mont Dutton R.T. (R) (T) [facility] [place] [phone] Office [phone] Pager [phone] Fax [email or web address]

## 2014-03-04 ENCOUNTER — Ambulatory Visit: Payer: Medicaid Other

## 2014-03-04 ENCOUNTER — Other Ambulatory Visit: Payer: Medicaid Other

## 2014-03-08 NOTE — Telephone Encounter (Signed)
Message left to reschedule appt.

## 2014-03-10 ENCOUNTER — Ambulatory Visit: Payer: Medicaid Other | Admitting: Family Medicine

## 2014-03-10 ENCOUNTER — Ambulatory Visit: Payer: Medicaid Other

## 2014-03-10 ENCOUNTER — Ambulatory Visit: Payer: Medicaid Other | Admitting: Internal Medicine

## 2014-03-10 ENCOUNTER — Other Ambulatory Visit: Payer: Medicaid Other

## 2014-03-16 ENCOUNTER — Encounter: Payer: Self-pay | Admitting: Pharmacist

## 2014-03-16 ENCOUNTER — Other Ambulatory Visit: Payer: Self-pay | Admitting: Family Medicine

## 2014-03-17 ENCOUNTER — Ambulatory Visit (HOSPITAL_BASED_OUTPATIENT_CLINIC_OR_DEPARTMENT_OTHER): Payer: Medicaid Other

## 2014-03-17 ENCOUNTER — Telehealth: Payer: Self-pay | Admitting: *Deleted

## 2014-03-17 ENCOUNTER — Other Ambulatory Visit: Payer: Self-pay | Admitting: Internal Medicine

## 2014-03-17 ENCOUNTER — Encounter: Payer: Self-pay | Admitting: Family Medicine

## 2014-03-17 ENCOUNTER — Other Ambulatory Visit: Payer: Self-pay | Admitting: Medical Oncology

## 2014-03-17 ENCOUNTER — Ambulatory Visit (INDEPENDENT_AMBULATORY_CARE_PROVIDER_SITE_OTHER): Payer: Medicaid Other | Admitting: Family Medicine

## 2014-03-17 ENCOUNTER — Other Ambulatory Visit (HOSPITAL_BASED_OUTPATIENT_CLINIC_OR_DEPARTMENT_OTHER): Payer: Medicaid Other

## 2014-03-17 VITALS — BP 126/83 | HR 84 | Temp 98.5°F | Wt 164.0 lb

## 2014-03-17 VITALS — BP 159/106 | HR 98 | Temp 97.6°F | Resp 18

## 2014-03-17 DIAGNOSIS — C3490 Malignant neoplasm of unspecified part of unspecified bronchus or lung: Secondary | ICD-10-CM

## 2014-03-17 DIAGNOSIS — Z85118 Personal history of other malignant neoplasm of bronchus and lung: Secondary | ICD-10-CM

## 2014-03-17 DIAGNOSIS — C7800 Secondary malignant neoplasm of unspecified lung: Secondary | ICD-10-CM

## 2014-03-17 DIAGNOSIS — R911 Solitary pulmonary nodule: Secondary | ICD-10-CM

## 2014-03-17 DIAGNOSIS — C3410 Malignant neoplasm of upper lobe, unspecified bronchus or lung: Secondary | ICD-10-CM

## 2014-03-17 DIAGNOSIS — Z5111 Encounter for antineoplastic chemotherapy: Secondary | ICD-10-CM

## 2014-03-17 DIAGNOSIS — G4700 Insomnia, unspecified: Secondary | ICD-10-CM

## 2014-03-17 DIAGNOSIS — E1149 Type 2 diabetes mellitus with other diabetic neurological complication: Secondary | ICD-10-CM

## 2014-03-17 DIAGNOSIS — C787 Secondary malignant neoplasm of liver and intrahepatic bile duct: Secondary | ICD-10-CM

## 2014-03-17 DIAGNOSIS — E1142 Type 2 diabetes mellitus with diabetic polyneuropathy: Secondary | ICD-10-CM

## 2014-03-17 LAB — CBC WITH DIFFERENTIAL/PLATELET
BASO%: 0 % (ref 0.0–2.0)
BASOS ABS: 0 10*3/uL (ref 0.0–0.1)
EOS ABS: 0 10*3/uL (ref 0.0–0.5)
EOS%: 0.8 % (ref 0.0–7.0)
HCT: 39.7 % (ref 38.4–49.9)
HGB: 14.1 g/dL (ref 13.0–17.1)
LYMPH%: 22.2 % (ref 14.0–49.0)
MCH: 36.2 pg — AB (ref 27.2–33.4)
MCHC: 35.5 g/dL (ref 32.0–36.0)
MCV: 101.8 fL — AB (ref 79.3–98.0)
MONO#: 0.5 10*3/uL (ref 0.1–0.9)
MONO%: 13.2 % (ref 0.0–14.0)
NEUT%: 63.8 % (ref 39.0–75.0)
NEUTROS ABS: 2.3 10*3/uL (ref 1.5–6.5)
PLATELETS: 114 10*3/uL — AB (ref 140–400)
RBC: 3.9 10*6/uL — ABNORMAL LOW (ref 4.20–5.82)
RDW: 14.1 % (ref 11.0–14.6)
WBC: 3.6 10*3/uL — ABNORMAL LOW (ref 4.0–10.3)
lymph#: 0.8 10*3/uL — ABNORMAL LOW (ref 0.9–3.3)

## 2014-03-17 LAB — COMPREHENSIVE METABOLIC PANEL (CC13)
ALK PHOS: 194 U/L — AB (ref 40–150)
ALT: 33 U/L (ref 0–55)
AST: 75 U/L — ABNORMAL HIGH (ref 5–34)
Albumin: 3.7 g/dL (ref 3.5–5.0)
Anion Gap: 12 mEq/L — ABNORMAL HIGH (ref 3–11)
BUN: 7.2 mg/dL (ref 7.0–26.0)
CO2: 24 mEq/L (ref 22–29)
Calcium: 9.7 mg/dL (ref 8.4–10.4)
Chloride: 106 mEq/L (ref 98–109)
Creatinine: 0.8 mg/dL (ref 0.7–1.3)
Glucose: 136 mg/dl (ref 70–140)
Potassium: 3.9 mEq/L (ref 3.5–5.1)
Sodium: 142 mEq/L (ref 136–145)
Total Bilirubin: 0.86 mg/dL (ref 0.20–1.20)
Total Protein: 7.4 g/dL (ref 6.4–8.3)

## 2014-03-17 LAB — MAGNESIUM (CC13): MAGNESIUM: 1.3 mg/dL — AB (ref 1.5–2.5)

## 2014-03-17 MED ORDER — ZOLPIDEM TARTRATE 5 MG PO TABS: 5.0000 mg | ORAL_TABLET | Freq: Every evening | ORAL | Status: DC | PRN

## 2014-03-17 MED ORDER — DEXAMETHASONE SODIUM PHOSPHATE 10 MG/ML IJ SOLN
10.0000 mg | Freq: Once | INTRAMUSCULAR | Status: AC
  Administered 2014-03-17: 10 mg via INTRAVENOUS

## 2014-03-17 MED ORDER — SODIUM CHLORIDE 0.9 % IJ SOLN
10.0000 mL | INTRAMUSCULAR | Status: DC | PRN
  Administered 2014-03-17: 10 mL
  Filled 2014-03-17: qty 10

## 2014-03-17 MED ORDER — ACCU-CHEK SOFTCLIX LANCET DEV MISC: Status: DC

## 2014-03-17 MED ORDER — ATROPINE SULFATE 1 MG/ML IJ SOLN
INTRAMUSCULAR | Status: AC
  Filled 2014-03-17: qty 1

## 2014-03-17 MED ORDER — ONDANSETRON 8 MG/50ML IVPB (CHCC)
8.0000 mg | Freq: Once | INTRAVENOUS | Status: AC
  Administered 2014-03-17: 8 mg via INTRAVENOUS

## 2014-03-17 MED ORDER — SODIUM CHLORIDE 0.9 % IV SOLN
Freq: Once | INTRAVENOUS | Status: AC
  Administered 2014-03-17: 10:00:00 via INTRAVENOUS

## 2014-03-17 MED ORDER — ATROPINE SULFATE 1 MG/ML IJ SOLN
0.5000 mg | Freq: Once | INTRAMUSCULAR | Status: AC | PRN
  Administered 2014-03-17: 0.5 mg via INTRAVENOUS

## 2014-03-17 MED ORDER — ONDANSETRON 8 MG/NS 50 ML IVPB
INTRAVENOUS | Status: AC
  Filled 2014-03-17: qty 8

## 2014-03-17 MED ORDER — HEPARIN SOD (PORK) LOCK FLUSH 100 UNIT/ML IV SOLN
500.0000 [IU] | Freq: Once | INTRAVENOUS | Status: AC | PRN
  Administered 2014-03-17: 500 [IU]
  Filled 2014-03-17: qty 5

## 2014-03-17 MED ORDER — DEXAMETHASONE SODIUM PHOSPHATE 10 MG/ML IJ SOLN
INTRAMUSCULAR | Status: AC
  Filled 2014-03-17: qty 1

## 2014-03-17 MED ORDER — IRINOTECAN HCL CHEMO INJECTION 100 MG/5ML
65.0000 mg/m2 | Freq: Once | INTRAVENOUS | Status: AC
  Administered 2014-03-17: 126 mg via INTRAVENOUS
  Filled 2014-03-17: qty 6.3

## 2014-03-17 NOTE — Patient Instructions (Signed)
[facility] Discharge Instructions for Patients Receiving Chemotherapy  Today you received the following chemotherapy agents CAMPTOSAR To help prevent nausea and vomiting after your treatment, we encourage you to take your nausea medication as prescribed. If you develop nausea and vomiting that is not controlled by your nausea medication, call the clinic.   BELOW ARE SYMPTOMS THAT SHOULD BE REPORTED IMMEDIATELY:  *FEVER GREATER THAN 100.5 F  *CHILLS WITH OR WITHOUT FEVER  NAUSEA AND VOMITING THAT IS NOT CONTROLLED WITH YOUR NAUSEA MEDICATION  *UNUSUAL SHORTNESS OF BREATH  *UNUSUAL BRUISING OR BLEEDING  TENDERNESS IN MOUTH AND THROAT WITH OR WITHOUT PRESENCE OF ULCERS  *URINARY PROBLEMS  *BOWEL PROBLEMS  UNUSUAL RASH Items with * indicate a potential emergency and should be followed up as soon as possible.  Feel free to call the clinic you have any questions or concerns. The clinic phone number is [phone]. Diarrhea Diarrhea is watery poop (stool). It can make you feel weak, tired, thirsty, or give you a dry mouth (signs of dehydration). Watery poop is a sign of another problem, most often an infection. It often lasts 2-3 days. It can last longer if it is a sign of something serious. Take care of yourself as told by your doctor. HOME CARE   Drink 1 cup (8 ounces) of fluid each time you have watery poop.  Do not drink the following fluids:  Those that contain simple sugars (fructose, glucose, galactose, lactose, sucrose, maltose).  Sports drinks.  Fruit juices.  Whole milk products.  Sodas.  Drinks with caffeine (coffee, tea, soda) or alcohol.  Oral rehydration solution may be used if the doctor says it is okay. You may make your own solution. Follow this recipe:   - teaspoon table salt.   teaspoon baking soda.   teaspoon salt substitute containing potassium chloride.  1 tablespoons sugar.  1 liter (34 ounces) of water.  Avoid the  following foods:  High fiber foods, such as raw fruits and vegetables.  Nuts, seeds, and whole grain breads and cereals.   Those that are sweetened with sugar alcohols (xylitol, sorbitol, mannitol).  Try eating the following foods:  Starchy foods, such as rice, toast, pasta, low-sugar cereal, oatmeal, baked potatoes, crackers, and bagels.  Bananas.  Applesauce.  Eat probiotic-rich foods, such as yogurt and milk products that are fermented.  Wash your hands well after each time you have watery poop.  Only take medicine as told by your doctor.  Take a warm bath to help lessen burning or pain from having watery poop. GET HELP RIGHT AWAY IF:   You cannot drink fluids without throwing up (vomiting).  You keep throwing up.  You have blood in your poop, or your poop looks black and tarry.  You do not pee (urinate) in 6-8 hours, or there is only a small amount of very dark pee.  You have belly (abdominal) pain that gets worse or stays in the same spot (localizes).  You are weak, dizzy, confused, or lightheaded.  You have a very bad headache.  Your watery poop gets worse or does not get better.  You have a fever or lasting symptoms for more than 2-3 days.  You have a fever and your symptoms suddenly get worse. MAKE SURE YOU:   Understand these instructions.  Will watch your condition.  Will get help right away if you are not doing well or get worse. Document Released: 02/12/2008 Document Revised: 05/20/2012 Document Reviewed: 05/03/2012 Exit[employer] Patient Information 2015  [employer]. This information is not intended to replace advice given to you by your health care provider. Make sure you discuss any questions you have with your health care provider.

## 2014-03-17 NOTE — Progress Notes (Signed)
Pt talked to his brother last night and Sam has agreed to proceed with chemo . Earlier he was not going to do anymore scans because of expense and not wanting to owe [facility] money.HE called accounting about his bill and has never heard back.

## 2014-03-17 NOTE — Telephone Encounter (Signed)
PEr chemo RN and POF I have scheduled apapts. Chemo RN to give schedule

## 2014-03-17 NOTE — Patient Instructions (Signed)
Dear William Townsend, Thank you for coming in to clinic today. It was good to see you again!  Today we discussed your Insomnia and Chemotherapy. 1. For your insomnia, I have printed a new rx for Ambien 5mg  tabs, please take right before going to bed, use as needed. It may be most effective following your chemotherapy, as that sounds like the main trigger for your insomnia. 2. Make sure that you practice good Sleep Hygiene. Avoid caffeine (especially in the evening), work on exercising and staying active during the day, avoid prolonged TV / Laptop screen time in the evening (none 30 min before bed). 3. I sent rx for Accu-Chek Softclix lancets already to [facility], call back with any concerns and I will correct them. This should work. 4. For your Gabapentin, it looks like it was printed before, so make sure that you call and have the pharmacy send [place] a new refill request if it is needed.  Some important numbers from today's visit: BP - 126/83  Please schedule a follow-up appointment with me in 3 to 6 months for regular follow-up, or sooner as needed.  If you have any other questions or concerns, please feel free to call the clinic to contact me. You may also schedule an earlier appointment if necessary.  However, if your symptoms get significantly worse, please go to the Emergency Department to seek immediate medical attention.  [name], [place]

## 2014-03-17 NOTE — Assessment & Plan Note (Signed)
Consistent with insomnia, likely worsened significantly by chemotherapy - Difficulty with sleep initiation - Currently on Irinotecan, noted to have side-effect insomnia (up to nearly 20% of pts)  Plan: 1. Start Ambien 5mg  nightly PRN 2. RTC as needed

## 2014-03-17 NOTE — Progress Notes (Signed)
Patient ID: William Townsend, male   DOB: 03/29/1961, 53 y.o.   MRN: [id number] Subjective:    Patient presents for same day appointment.  HPI  INSOMNIA / DIFFICULTY SLEEPING: - Reports that has had difficulty sleeping for a few months, significantly worse last week after chemotherapy, Sunday through Tuesday with very minimal sleep, does not think he slept at all. States that he had gone to bed but "tossed and turned" without ever able to fall asleep, ends up staying up and using laptop and not going back to sleep - States he has been given Ambien with good results while staying in hospital recently - Reports receiving chemotherapy today, felt "okay" after chemo, and then continued to feel better, overall recently has been feeling really good each day, with increased activity and exercise - Concerned about recent hospital bill for CT/MRI - Denies fever/chills, HA, SOB  I have reviewed and updated the following as appropriate: allergies and current medications  Social Hx: - Active smoker  Review of Systems  See above HPI.     Objective:   Physical Exam  BP 126/83  Pulse 84  Temp(Src) 98.5 F (36.9 C) (Oral)  Wt 164 lb (74.39 kg)  Gen - thin and chronically ill, but well-appearing, NAD HEENT - NCAT, PERRL, EOMI, MMM Heart - RRR, no murmurs heard Lungs - CTAB, no wheezing, crackles, or rhonchi. Normal work of breathing. Neuro - awake, alert, oriented, grossly non-focal, intact muscle strength 5/5 b/l, intact distal sensation to light touch     Assessment:     See specific A&P problem list for details.      Plan:     See specific A&P problem list for details.

## 2014-03-17 NOTE — Progress Notes (Signed)
Per Dr Julien Nordmann it is okay to treat pt today with camptosar and CMP results from today. Per Dr Julien Nordmann I instructed pt to increase Mag oxide to 1 tablet TID. He voices understanding,

## 2014-03-18 NOTE — Progress Notes (Signed)
Quick Note:  Call patient with the result and increase Mg oxide dose by 400 mg ______

## 2014-03-22 ENCOUNTER — Telehealth: Payer: Self-pay | Admitting: Family Medicine

## 2014-03-22 ENCOUNTER — Telehealth: Payer: Self-pay | Admitting: Medical Oncology

## 2014-03-22 NOTE — Telephone Encounter (Signed)
Notes faxed, FYI to PCP

## 2014-03-22 NOTE — Telephone Encounter (Signed)
Pt referred to hospice by [facility]. I told Olean Ree at hospice that pt still undergoing chemotherapy and scheduled for this week too. She will make an informational visit.

## 2014-03-22 NOTE — Telephone Encounter (Signed)
Olean Ree from [facility] called and they are taking care of Tyjae. They would like Dr. Parks Ranger to send the last office visits notes to them. Please fax them to [phone] attention Olean Ree. jw

## 2014-03-24 ENCOUNTER — Other Ambulatory Visit: Payer: Medicaid Other

## 2014-03-24 ENCOUNTER — Ambulatory Visit: Payer: Medicaid Other

## 2014-03-24 ENCOUNTER — Telehealth: Payer: Self-pay | Admitting: Internal Medicine

## 2014-03-24 NOTE — Telephone Encounter (Signed)
pt has transportation problems just wanted to come nxt week...pt aware of d.t

## 2014-03-31 ENCOUNTER — Ambulatory Visit: Payer: Medicaid Other

## 2014-03-31 ENCOUNTER — Other Ambulatory Visit: Payer: Medicaid Other

## 2014-04-04 ENCOUNTER — Ambulatory Visit: Payer: Medicaid Other | Admitting: Nurse Practitioner

## 2014-04-04 ENCOUNTER — Telehealth: Payer: Self-pay | Admitting: Medical Oncology

## 2014-04-04 NOTE — Telephone Encounter (Addendum)
Received call from hospice that ?daughter wants referral for pt. I called pt and he does not want hospice- he wants to continue chemotherapy. He admits to drinking and spending money for transportation to get alcohol instead of transportation to come to treatment last week.  He is trying to quit alcohol and thinks if he could have something to help with anxiety it will decrease his urge to drink.alcohol. HE also is having increased pain with neuropathy. Note to [place].

## 2014-04-05 ENCOUNTER — Telehealth: Payer: Self-pay | Admitting: Medical Oncology

## 2014-04-05 NOTE — Telephone Encounter (Signed)
Message copied by Ardeen Garland on Tue Apr 05, 2014 12:46 PM ------      Message from: Curt Bears      Created: Mon Apr 04, 2014  8:26 PM       I am worried about mixing Anxiety medicine with Alcohol and make it worse. He has to quite first.      ----- Message -----         From: Ardeen Garland, RN         Sent: 04/04/2014   4:28 PM           To: Curt Bears, MD            Received call from hospice that ?daughter wants referral for pt. I called pt and he does not want hospice- he wants to continue chemotherapy. He admits to drinking and spending money for transportation to get alcohol instead of transportation to come to treatment last week.  He is trying to quit alcohol and thinks if he could have something to help with anxiety it will decrease his urge to drink.alcohol. He also is having increased pain with neuropathy.       Anything for anxiety and ? Do you want to increase neurontin?             ------

## 2014-04-05 NOTE — Telephone Encounter (Signed)
Pt notified about information below and instructed to keep appt Thursday .

## 2014-04-07 ENCOUNTER — Other Ambulatory Visit (HOSPITAL_BASED_OUTPATIENT_CLINIC_OR_DEPARTMENT_OTHER): Payer: Medicaid Other

## 2014-04-07 ENCOUNTER — Ambulatory Visit (HOSPITAL_BASED_OUTPATIENT_CLINIC_OR_DEPARTMENT_OTHER): Payer: Medicaid Other | Admitting: Nurse Practitioner

## 2014-04-07 ENCOUNTER — Ambulatory Visit: Payer: Medicaid Other

## 2014-04-07 ENCOUNTER — Telehealth: Payer: Self-pay | Admitting: Oncology

## 2014-04-07 VITALS — BP 159/91 | HR 86 | Temp 98.6°F | Resp 18 | Ht 73.0 in | Wt 165.1 lb

## 2014-04-07 DIAGNOSIS — C3490 Malignant neoplasm of unspecified part of unspecified bronchus or lung: Secondary | ICD-10-CM

## 2014-04-07 DIAGNOSIS — C7889 Secondary malignant neoplasm of other digestive organs: Secondary | ICD-10-CM

## 2014-04-07 DIAGNOSIS — C787 Secondary malignant neoplasm of liver and intrahepatic bile duct: Secondary | ICD-10-CM

## 2014-04-07 DIAGNOSIS — D6959 Other secondary thrombocytopenia: Secondary | ICD-10-CM

## 2014-04-07 LAB — COMPREHENSIVE METABOLIC PANEL (CC13)
ALT: 71 U/L — ABNORMAL HIGH (ref 0–55)
AST: 198 U/L (ref 5–34)
Albumin: 3.5 g/dL (ref 3.5–5.0)
Alkaline Phosphatase: 229 U/L — ABNORMAL HIGH (ref 40–150)
Anion Gap: 9 mEq/L (ref 3–11)
BILIRUBIN TOTAL: 1.41 mg/dL — AB (ref 0.20–1.20)
BUN: 8.8 mg/dL (ref 7.0–26.0)
CO2: 27 meq/L (ref 22–29)
CREATININE: 0.9 mg/dL (ref 0.7–1.3)
Calcium: 8.9 mg/dL (ref 8.4–10.4)
Chloride: 103 mEq/L (ref 98–109)
Glucose: 261 mg/dl — ABNORMAL HIGH (ref 70–140)
Potassium: 4 mEq/L (ref 3.5–5.1)
SODIUM: 139 meq/L (ref 136–145)
TOTAL PROTEIN: 6.8 g/dL (ref 6.4–8.3)

## 2014-04-07 LAB — CBC WITH DIFFERENTIAL/PLATELET
BASO%: 0 % (ref 0.0–2.0)
Basophils Absolute: 0 10*3/uL (ref 0.0–0.1)
EOS%: 1.6 % (ref 0.0–7.0)
Eosinophils Absolute: 0 10*3/uL (ref 0.0–0.5)
HEMATOCRIT: 35.7 % — AB (ref 38.4–49.9)
HGB: 12.8 g/dL — ABNORMAL LOW (ref 13.0–17.1)
LYMPH%: 26.6 % (ref 14.0–49.0)
MCH: 36.4 pg — ABNORMAL HIGH (ref 27.2–33.4)
MCHC: 35.9 g/dL (ref 32.0–36.0)
MCV: 101.4 fL — ABNORMAL HIGH (ref 79.3–98.0)
MONO#: 0.3 10*3/uL (ref 0.1–0.9)
MONO%: 17.2 % — ABNORMAL HIGH (ref 0.0–14.0)
NEUT%: 54.6 % (ref 39.0–75.0)
NEUTROS ABS: 1.1 10*3/uL — AB (ref 1.5–6.5)
PLATELETS: 60 10*3/uL — AB (ref 140–400)
RBC: 3.52 10*6/uL — ABNORMAL LOW (ref 4.20–5.82)
RDW: 14.3 % (ref 11.0–14.6)
WBC: 1.9 10*3/uL — AB (ref 4.0–10.3)
lymph#: 0.5 10*3/uL — ABNORMAL LOW (ref 0.9–3.3)

## 2014-04-07 LAB — MAGNESIUM (CC13): Magnesium: 1.7 mg/dl (ref 1.5–2.5)

## 2014-04-07 NOTE — Progress Notes (Addendum)
[facility] OFFICE PROGRESS NOTE   Diagnosis:  Extensive stage small cell lung cancer.  INTERVAL HISTORY:   William Townsend is a 53 year old man with extensive stage small cell lung cancer currently on active treatment with single agent irinotecan 65 mg per meter squared on a day 1/day 8 schedule every 21 days. He last received irinotecan on 03/17/2014.  He denies nausea/vomiting following chemotherapy. No mouth sores. He had a single loose stool last week. No shortness of breath or cough. No fever. Appetite is better. He has neuropathy symptoms involving the feet with associated pain. He continues gabapentin.  He reports continued excessive alcohol intake. He is trying to "wean down".  Objective:  Vital signs in last 24 hours:  Blood pressure 159/91, pulse 86, temperature 98.6 F (37 C), temperature source Oral, resp. rate 18, height 6\' 1"  (1.854 m), weight 165 lb 1.6 oz (74.889 kg).    HEENT: No thrush or ulcerations. Resp: Lungs clear. Cardio: Regular cardiac rhythm. GI: Abdomen soft and nontender. No hepatomegaly. Vascular: No leg edema.  Neuro: Alert and oriented. Follows commands. Skin: No rash.  Lab Results:  Lab Results  Component Value Date   WBC 1.9* 04/07/2014   HGB 12.8* 04/07/2014   HCT 35.7* 04/07/2014   MCV 101.4* 04/07/2014   PLT 60* 04/07/2014   NEUTROABS 1.1* 04/07/2014    Imaging:  No results found.  Medications: I have reviewed the patient's current medications.  Assessment/Plan: 1. Extensive stage small cell lung cancer diagnosed September 2014 status post 6 cycles of systemic chemotherapy with carboplatin and etoposide. Restaging CT evaluation 12/17/2013 with disease progression within the chest, hepatic and splenic metastasis. Possible developing porta hepatis nodal metastasis. Cisplatin/irinotecan initiated on a day 1/day 8 schedule every 21 days beginning 01/06/2014. Cisplatin discontinued after cycle 1 due to a hypersensitivity reaction.  Single agent irinotecan continued on the same schedule. 2. Thrombocytopenia secondary to chemotherapy. 3. Liver function abnormalities, question EtOH-induced, question progressive hepatic metastatic disease.   Disposition: William Townsend appears stable. We are holding today's treatment due to thrombocytopenia and the liver function abnormalities. The etiology of the abnormal LFTs is unclear, question related to EtOH intake versus progressive metastatic disease involving the liver.  He was strongly encouraged to decrease his alcohol consumption.   We will see him in one week with a repeat chemistry panel.  Patient seen with Dr. Julien Nordmann.    Ned Card ANP/GNP-BC   04/07/2014  1:39 PM  ADDENDUM: Hematology/Oncology Attending: I had a face to face encounter with the patient. I recommended his care plan. This is a very pleasant 53 years old white male with extensive stage small cell lung cancer and recent disease progression. He was started on second line chemotherapy with irinotecan 65 mg/M2 status post one cycle and he is currently undergoing cycle #2. Unfortunately the patient was depressed and he has been drinking excessive amounts of alcohol on daily basis. He came today accompanied by his roommate who was working with the patient on tapering the amount of alcohol he drinks on daily basis. He was here today for evaluation before starting day 8 of cycle #2. Unfortunately his platelets count are low and his liver enzymes are elevated most likely secondary to the excision of alcohol abuse but disease progression cannot be excluded at this point. I will hold his treatment today and repeat CBC and comprehensive metabolic next week. If the platelets count and liver enzymes are improved, I would consider resuming his treatment in 2 weeks, otherwise  I will order restaging scan to evaluate his condition and rule out disease progression. The patient agreed to the current plan. He was advised to call  immediately if he has any concerning symptoms in the interval.  Disclaimer: This note was dictated with voice recognition software. Similar sounding words can inadvertently be transcribed and may not be corrected upon review. William Kempf., MD 04/07/2014

## 2014-04-07 NOTE — Telephone Encounter (Signed)
gv and printed appt sched and avs for pt for Aug °

## 2014-04-08 ENCOUNTER — Telehealth: Payer: Self-pay | Admitting: Medical Oncology

## 2014-04-08 NOTE — Telephone Encounter (Signed)
I returned pts call . H e wanted to know if there was anything he can eat or do to increase platelet count. I told him no that his bone marrow hopefully will recover and reproduce them.

## 2014-04-14 ENCOUNTER — Ambulatory Visit (HOSPITAL_BASED_OUTPATIENT_CLINIC_OR_DEPARTMENT_OTHER): Payer: Medicaid Other

## 2014-04-14 ENCOUNTER — Ambulatory Visit (HOSPITAL_BASED_OUTPATIENT_CLINIC_OR_DEPARTMENT_OTHER): Payer: Medicaid Other | Admitting: Nurse Practitioner

## 2014-04-14 ENCOUNTER — Other Ambulatory Visit (HOSPITAL_BASED_OUTPATIENT_CLINIC_OR_DEPARTMENT_OTHER): Payer: Medicaid Other

## 2014-04-14 ENCOUNTER — Telehealth: Payer: Self-pay | Admitting: Internal Medicine

## 2014-04-14 ENCOUNTER — Other Ambulatory Visit: Payer: Self-pay | Admitting: Internal Medicine

## 2014-04-14 VITALS — BP 130/84

## 2014-04-14 VITALS — BP 167/98 | HR 86 | Temp 98.6°F | Resp 18 | Ht 73.0 in | Wt 162.7 lb

## 2014-04-14 DIAGNOSIS — C7889 Secondary malignant neoplasm of other digestive organs: Secondary | ICD-10-CM

## 2014-04-14 DIAGNOSIS — Z85118 Personal history of other malignant neoplasm of bronchus and lung: Secondary | ICD-10-CM

## 2014-04-14 DIAGNOSIS — R911 Solitary pulmonary nodule: Secondary | ICD-10-CM

## 2014-04-14 DIAGNOSIS — C3490 Malignant neoplasm of unspecified part of unspecified bronchus or lung: Secondary | ICD-10-CM

## 2014-04-14 DIAGNOSIS — C3410 Malignant neoplasm of upper lobe, unspecified bronchus or lung: Secondary | ICD-10-CM

## 2014-04-14 DIAGNOSIS — R7989 Other specified abnormal findings of blood chemistry: Secondary | ICD-10-CM

## 2014-04-14 DIAGNOSIS — C787 Secondary malignant neoplasm of liver and intrahepatic bile duct: Secondary | ICD-10-CM

## 2014-04-14 DIAGNOSIS — Z5111 Encounter for antineoplastic chemotherapy: Secondary | ICD-10-CM

## 2014-04-14 LAB — COMPREHENSIVE METABOLIC PANEL (CC13)
ALBUMIN: 3.6 g/dL (ref 3.5–5.0)
ALT: 94 U/L — AB (ref 0–55)
ANION GAP: 13 meq/L — AB (ref 3–11)
AST: 160 U/L — ABNORMAL HIGH (ref 5–34)
Alkaline Phosphatase: 204 U/L — ABNORMAL HIGH (ref 40–150)
BUN: 6.1 mg/dL — ABNORMAL LOW (ref 7.0–26.0)
CALCIUM: 9.3 mg/dL (ref 8.4–10.4)
CHLORIDE: 106 meq/L (ref 98–109)
CO2: 22 meq/L (ref 22–29)
Creatinine: 0.8 mg/dL (ref 0.7–1.3)
Glucose: 152 mg/dl — ABNORMAL HIGH (ref 70–140)
Potassium: 4 mEq/L (ref 3.5–5.1)
SODIUM: 142 meq/L (ref 136–145)
TOTAL PROTEIN: 7.2 g/dL (ref 6.4–8.3)
Total Bilirubin: 0.91 mg/dL (ref 0.20–1.20)

## 2014-04-14 LAB — CBC WITH DIFFERENTIAL/PLATELET
BASO%: 0.6 % (ref 0.0–2.0)
Basophils Absolute: 0 10*3/uL (ref 0.0–0.1)
EOS%: 1.1 % (ref 0.0–7.0)
Eosinophils Absolute: 0 10*3/uL (ref 0.0–0.5)
HCT: 44.4 % (ref 38.4–49.9)
HGB: 15 g/dL (ref 13.0–17.1)
LYMPH#: 0.7 10*3/uL — AB (ref 0.9–3.3)
LYMPH%: 22.3 % (ref 14.0–49.0)
MCH: 35.7 pg — ABNORMAL HIGH (ref 27.2–33.4)
MCHC: 33.8 g/dL (ref 32.0–36.0)
MCV: 105.5 fL — ABNORMAL HIGH (ref 79.3–98.0)
MONO#: 0.5 10*3/uL (ref 0.1–0.9)
MONO%: 17.5 % — ABNORMAL HIGH (ref 0.0–14.0)
NEUT%: 58.5 % (ref 39.0–75.0)
NEUTROS ABS: 1.7 10*3/uL (ref 1.5–6.5)
PLATELETS: 147 10*3/uL (ref 140–400)
RBC: 4.21 10*6/uL (ref 4.20–5.82)
RDW: 15.2 % — ABNORMAL HIGH (ref 11.0–14.6)
WBC: 3 10*3/uL — AB (ref 4.0–10.3)

## 2014-04-14 LAB — MAGNESIUM (CC13): Magnesium: 1.4 mg/dl — CL (ref 1.5–2.5)

## 2014-04-14 MED ORDER — DEXAMETHASONE SODIUM PHOSPHATE 10 MG/ML IJ SOLN
10.0000 mg | Freq: Once | INTRAMUSCULAR | Status: AC
  Administered 2014-04-14: 10 mg via INTRAVENOUS

## 2014-04-14 MED ORDER — SODIUM CHLORIDE 0.9 % IV SOLN
Freq: Once | INTRAVENOUS | Status: AC
  Administered 2014-04-14: 12:00:00 via INTRAVENOUS

## 2014-04-14 MED ORDER — ONDANSETRON 8 MG/50ML IVPB (CHCC)
8.0000 mg | Freq: Once | INTRAVENOUS | Status: AC
  Administered 2014-04-14: 8 mg via INTRAVENOUS

## 2014-04-14 MED ORDER — DEXAMETHASONE SODIUM PHOSPHATE 10 MG/ML IJ SOLN
INTRAMUSCULAR | Status: AC
  Filled 2014-04-14: qty 1

## 2014-04-14 MED ORDER — SODIUM CHLORIDE 0.9 % IJ SOLN
10.0000 mL | INTRAMUSCULAR | Status: DC | PRN
  Administered 2014-04-14: 10 mL
  Filled 2014-04-14: qty 10

## 2014-04-14 MED ORDER — IRINOTECAN HCL CHEMO INJECTION 100 MG/5ML
65.0000 mg/m2 | Freq: Once | INTRAVENOUS | Status: AC
  Administered 2014-04-14: 126 mg via INTRAVENOUS
  Filled 2014-04-14: qty 6.3

## 2014-04-14 MED ORDER — ATROPINE SULFATE 1 MG/ML IJ SOLN
INTRAMUSCULAR | Status: AC
  Filled 2014-04-14: qty 1

## 2014-04-14 MED ORDER — ONDANSETRON 8 MG/NS 50 ML IVPB
INTRAVENOUS | Status: AC
  Filled 2014-04-14: qty 8

## 2014-04-14 MED ORDER — HEPARIN SOD (PORK) LOCK FLUSH 100 UNIT/ML IV SOLN
500.0000 [IU] | Freq: Once | INTRAVENOUS | Status: AC | PRN
  Administered 2014-04-14: 500 [IU]
  Filled 2014-04-14: qty 5

## 2014-04-14 MED ORDER — ATROPINE SULFATE 1 MG/ML IJ SOLN
0.5000 mg | Freq: Once | INTRAMUSCULAR | Status: AC | PRN
  Administered 2014-04-14: 0.5 mg via INTRAVENOUS

## 2014-04-14 NOTE — Telephone Encounter (Signed)
gv adn printed aptp sched and avs for pt fro Aug and SEpt...sed added tx.

## 2014-04-14 NOTE — Progress Notes (Signed)
  [facility] OFFICE PROGRESS NOTE   Diagnosis:  Extensive stage small cell lung cancer.  INTERVAL HISTORY:   William Townsend returns as scheduled. He is on active treatment with single aggent irinotecan 65 mg per meter squared day one/day 8 every 3 weeks. He was last treated on 03/17/2014. Treatment was placed on hold 04/07/2014 due to thrombocytopenia and liver function abnormalities.  He has markedly decreased alcohol consumption. He feels "a lot better". Energy level has improved. Appetite is "pretty good". He denies nausea/vomiting. No mouth sores. No diarrhea. He denies shortness of breath. He has an occasional cough. No fever. He occasionally notes that he becomes sweaty throughout one hour after taking his medications.  Objective:  Vital signs in last 24 hours:  Blood pressure 167/98, pulse 86, temperature 98.6 F (37 C), temperature source Oral, resp. rate 18, height 6\' 1"  (1.854 m), weight 162 lb 11.2 oz (73.8 kg), SpO2 99.00%. repeat blood pressure 148/98.    HEENT: No thrush or ulcerations. Lymphatics: No palpable cervical or supraclavicular lymph nodes. Resp: Lungs clear. Cardio: Regular rate and rhythm. GI: Abdomen soft and nontender. Question palpable liver right lateral abdomen. Vascular: No leg edema. Neuro: Alert and oriented.  Skin: No rash.  Port-A-Cath site without erythema.    Lab Results:  Lab Results  Component Value Date   WBC 3.0* 04/14/2014   HGB 15.0 04/14/2014   HCT 44.4 04/14/2014   MCV 105.5* 04/14/2014   PLT 147 04/14/2014   NEUTROABS 1.7 04/14/2014    Imaging:  No results found.  Medications: I have reviewed the patient's current medications.  Assessment/Plan: 1. Extensive stage small cell lung cancer diagnosed September 2014 status post 6 cycles of systemic chemotherapy with carboplatin and etoposide. Restaging CT evaluation 12/17/2013 with disease progression within the chest, hepatic and splenic metastasis. Possible developing porta  hepatis nodal metastasis. Cisplatin/irinotecan initiated on a day 1/day 8 schedule every 21 days beginning 01/06/2014. Cisplatin discontinued after cycle 1 due to a hypersensitivity reaction. Single agent irinotecan continued on the same schedule. 2. Thrombocytopenia 04/07/2014. 3. Liver function abnormalities 04/07/2014, question EtOH-induced, question progressive hepatic metastatic disease. Improved. 4. Hypomagnesemia. He will increase magnesium oxide to 400 mg 3 times daily.   Disposition: Mr. Ander appears stable. The platelet count has recovered and liver function tests are better. Dr. Julien Nordmann recommends resuming single agent irinotecan on the previous day one/day 8 schedule of a 21 day cycle.  The magnesium level is mildly decreased. He is currently taking magnesium oxide 400 mg twice daily. He will increase to 3 times daily.  Mr. Lucken will be seen in followup in 3 weeks. He will contact the office in the interim with any problems.   Plan reviewed with Dr. Julien Nordmann.   Ned Card ANP/GNP-BC   04/14/2014  10:39 AM

## 2014-04-14 NOTE — Patient Instructions (Signed)
[facility] Discharge Instructions for Patients Receiving Chemotherapy  Today you received the following chemotherapy agents:  Camptosar  To help prevent nausea and vomiting after your treatment, we encourage you to take your nausea medication as ordered per MD.   If you develop nausea and vomiting that is not controlled by your nausea medication, call the clinic.   BELOW ARE SYMPTOMS THAT SHOULD BE REPORTED IMMEDIATELY:  *FEVER GREATER THAN 100.5 F  *CHILLS WITH OR WITHOUT FEVER  NAUSEA AND VOMITING THAT IS NOT CONTROLLED WITH YOUR NAUSEA MEDICATION  *UNUSUAL SHORTNESS OF BREATH  *UNUSUAL BRUISING OR BLEEDING  TENDERNESS IN MOUTH AND THROAT WITH OR WITHOUT PRESENCE OF ULCERS  *URINARY PROBLEMS  *BOWEL PROBLEMS  UNUSUAL RASH Items with * indicate a potential emergency and should be followed up as soon as possible.  Feel free to call the clinic you have any questions or concerns. The clinic phone number is [phone].

## 2014-04-21 ENCOUNTER — Ambulatory Visit: Payer: Medicaid Other

## 2014-04-21 ENCOUNTER — Ambulatory Visit (HOSPITAL_BASED_OUTPATIENT_CLINIC_OR_DEPARTMENT_OTHER): Payer: Medicaid Other

## 2014-04-21 ENCOUNTER — Other Ambulatory Visit (HOSPITAL_BASED_OUTPATIENT_CLINIC_OR_DEPARTMENT_OTHER): Payer: Medicaid Other

## 2014-04-21 VITALS — BP 138/89 | HR 87 | Temp 98.5°F | Resp 20

## 2014-04-21 DIAGNOSIS — C787 Secondary malignant neoplasm of liver and intrahepatic bile duct: Secondary | ICD-10-CM

## 2014-04-21 DIAGNOSIS — C3491 Malignant neoplasm of unspecified part of right bronchus or lung: Secondary | ICD-10-CM

## 2014-04-21 DIAGNOSIS — C3410 Malignant neoplasm of upper lobe, unspecified bronchus or lung: Secondary | ICD-10-CM

## 2014-04-21 DIAGNOSIS — Z95828 Presence of other vascular implants and grafts: Secondary | ICD-10-CM

## 2014-04-21 DIAGNOSIS — C3490 Malignant neoplasm of unspecified part of unspecified bronchus or lung: Secondary | ICD-10-CM

## 2014-04-21 DIAGNOSIS — R911 Solitary pulmonary nodule: Secondary | ICD-10-CM

## 2014-04-21 DIAGNOSIS — Z5111 Encounter for antineoplastic chemotherapy: Secondary | ICD-10-CM

## 2014-04-21 DIAGNOSIS — Z85118 Personal history of other malignant neoplasm of bronchus and lung: Secondary | ICD-10-CM

## 2014-04-21 LAB — CBC WITH DIFFERENTIAL/PLATELET
BASO%: 0.3 % (ref 0.0–2.0)
Basophils Absolute: 0 10*3/uL (ref 0.0–0.1)
EOS%: 1.2 % (ref 0.0–7.0)
Eosinophils Absolute: 0 10*3/uL (ref 0.0–0.5)
HEMATOCRIT: 37.8 % — AB (ref 38.4–49.9)
HGB: 13.2 g/dL (ref 13.0–17.1)
LYMPH#: 0.7 10*3/uL — AB (ref 0.9–3.3)
LYMPH%: 21.8 % (ref 14.0–49.0)
MCH: 36.5 pg — ABNORMAL HIGH (ref 27.2–33.4)
MCHC: 34.8 g/dL (ref 32.0–36.0)
MCV: 104.7 fL — ABNORMAL HIGH (ref 79.3–98.0)
MONO#: 0.4 10*3/uL (ref 0.1–0.9)
MONO%: 11.1 % (ref 0.0–14.0)
NEUT#: 2.2 10*3/uL (ref 1.5–6.5)
NEUT%: 65.6 % (ref 39.0–75.0)
Platelets: 122 10*3/uL — ABNORMAL LOW (ref 140–400)
RBC: 3.61 10*6/uL — AB (ref 4.20–5.82)
RDW: 14.4 % (ref 11.0–14.6)
WBC: 3.3 10*3/uL — AB (ref 4.0–10.3)

## 2014-04-21 LAB — COMPREHENSIVE METABOLIC PANEL (CC13)
ALBUMIN: 3.5 g/dL (ref 3.5–5.0)
ALT: 61 U/L — AB (ref 0–55)
AST: 71 U/L — ABNORMAL HIGH (ref 5–34)
Alkaline Phosphatase: 207 U/L — ABNORMAL HIGH (ref 40–150)
Anion Gap: 13 mEq/L — ABNORMAL HIGH (ref 3–11)
BILIRUBIN TOTAL: 0.92 mg/dL (ref 0.20–1.20)
BUN: 13.1 mg/dL (ref 7.0–26.0)
CALCIUM: 9.4 mg/dL (ref 8.4–10.4)
CHLORIDE: 98 meq/L (ref 98–109)
CO2: 28 meq/L (ref 22–29)
Creatinine: 1 mg/dL (ref 0.7–1.3)
GLUCOSE: 270 mg/dL — AB (ref 70–140)
Potassium: 4.3 mEq/L (ref 3.5–5.1)
SODIUM: 139 meq/L (ref 136–145)
TOTAL PROTEIN: 7 g/dL (ref 6.4–8.3)

## 2014-04-21 MED ORDER — ATROPINE SULFATE 1 MG/ML IJ SOLN
0.5000 mg | Freq: Once | INTRAMUSCULAR | Status: AC | PRN
  Administered 2014-04-21: 0.5 mg via INTRAVENOUS

## 2014-04-21 MED ORDER — DEXAMETHASONE SODIUM PHOSPHATE 10 MG/ML IJ SOLN
10.0000 mg | Freq: Once | INTRAMUSCULAR | Status: AC
  Administered 2014-04-21: 10 mg via INTRAVENOUS

## 2014-04-21 MED ORDER — IRINOTECAN HCL CHEMO INJECTION 100 MG/5ML
65.0000 mg/m2 | Freq: Once | INTRAVENOUS | Status: AC
  Administered 2014-04-21: 126 mg via INTRAVENOUS
  Filled 2014-04-21: qty 6.3

## 2014-04-21 MED ORDER — HEPARIN SOD (PORK) LOCK FLUSH 100 UNIT/ML IV SOLN
500.0000 [IU] | Freq: Once | INTRAVENOUS | Status: AC | PRN
  Administered 2014-04-21: 500 [IU]
  Filled 2014-04-21: qty 5

## 2014-04-21 MED ORDER — ONDANSETRON 8 MG/50ML IVPB (CHCC)
8.0000 mg | Freq: Once | INTRAVENOUS | Status: AC
  Administered 2014-04-21: 8 mg via INTRAVENOUS

## 2014-04-21 MED ORDER — SODIUM CHLORIDE 0.9 % IV SOLN
Freq: Once | INTRAVENOUS | Status: AC
  Administered 2014-04-21: 10:00:00 via INTRAVENOUS

## 2014-04-21 MED ORDER — DEXAMETHASONE SODIUM PHOSPHATE 10 MG/ML IJ SOLN
INTRAMUSCULAR | Status: AC
  Filled 2014-04-21: qty 1

## 2014-04-21 MED ORDER — SODIUM CHLORIDE 0.9 % IJ SOLN
10.0000 mL | INTRAMUSCULAR | Status: DC | PRN
  Administered 2014-04-21: 10 mL via INTRAVENOUS
  Filled 2014-04-21: qty 10

## 2014-04-21 MED ORDER — ATROPINE SULFATE 1 MG/ML IJ SOLN
INTRAMUSCULAR | Status: AC
  Filled 2014-04-21: qty 1

## 2014-04-21 MED ORDER — ONDANSETRON 8 MG/NS 50 ML IVPB
INTRAVENOUS | Status: AC
  Filled 2014-04-21: qty 8

## 2014-04-21 MED ORDER — SODIUM CHLORIDE 0.9 % IJ SOLN
10.0000 mL | INTRAMUSCULAR | Status: DC | PRN
  Administered 2014-04-21: 10 mL
  Filled 2014-04-21: qty 10

## 2014-04-21 NOTE — Patient Instructions (Signed)
[facility] Discharge Instructions for Patients Receiving Chemotherapy  Today you received the following chemotherapy agents:  Camptosar  To help prevent nausea and vomiting after your treatment, we encourage you to take your nausea medication as ordered per MD.   If you develop nausea and vomiting that is not controlled by your nausea medication, call the clinic.   BELOW ARE SYMPTOMS THAT SHOULD BE REPORTED IMMEDIATELY:  *FEVER GREATER THAN 100.5 F  *CHILLS WITH OR WITHOUT FEVER  NAUSEA AND VOMITING THAT IS NOT CONTROLLED WITH YOUR NAUSEA MEDICATION  *UNUSUAL SHORTNESS OF BREATH  *UNUSUAL BRUISING OR BLEEDING  TENDERNESS IN MOUTH AND THROAT WITH OR WITHOUT PRESENCE OF ULCERS  *URINARY PROBLEMS  *BOWEL PROBLEMS  UNUSUAL RASH Items with * indicate a potential emergency and should be followed up as soon as possible.  Feel free to call the clinic you have any questions or concerns. The clinic phone number is [phone].

## 2014-04-21 NOTE — Progress Notes (Signed)
Per Dr Julien Nordmann it is okay to treat pt today with chemo and labs from today.

## 2014-04-21 NOTE — Patient Instructions (Signed)

## 2014-04-22 ENCOUNTER — Telehealth: Payer: Self-pay | Admitting: *Deleted

## 2014-04-22 NOTE — Telephone Encounter (Signed)
Spoke with patient r/s appointment to 04/26/14 with NP MM. NP LL has never seen patient before.

## 2014-04-26 ENCOUNTER — Encounter: Payer: Self-pay | Admitting: Adult Health

## 2014-04-26 ENCOUNTER — Ambulatory Visit (INDEPENDENT_AMBULATORY_CARE_PROVIDER_SITE_OTHER): Payer: Medicaid Other | Admitting: Adult Health

## 2014-04-26 VITALS — BP 150/89 | HR 91 | Ht 72.0 in | Wt 164.0 lb

## 2014-04-26 DIAGNOSIS — R569 Unspecified convulsions: Secondary | ICD-10-CM

## 2014-04-26 DIAGNOSIS — R269 Unspecified abnormalities of gait and mobility: Secondary | ICD-10-CM

## 2014-04-26 NOTE — Patient Instructions (Signed)
Seizure, Adult A seizure means there is unusual activity in the brain. A seizure can cause changes in attention or behavior. Seizures often cause shaking (convulsions). Seizures often last from 30 seconds to 2 minutes. HOME CARE   If you are given medicines, take them exactly as told by your doctor.  Keep all doctor visits as told.  Do not swim or drive until your doctor says it is okay.  Teach others what to do if you have a seizure. They should:  Lay you on the ground.  Put a cushion under your head.  Loosen any tight clothing around your neck.  Turn you on your side.  Stay with you until you get better. GET HELP RIGHT AWAY IF:   The seizure lasts longer than 2 to 5 minutes.  The seizure is very bad.  The person does not wake up after the seizure.  The person's attention or behavior changes. Drive the person to the emergency room or call your local emergency services (911 in [place] MAKE SURE YOU:   Understand these instructions.  Will watch your condition.  Will get help right away if you are not doing well or get worse. Document Released: 02/12/2008 Document Revised: 11/18/2011 Document Reviewed: 08/14/2011 Exit[employer] Patient Information 2015 [employer]. This information is not intended to replace advice given to you by your health care provider. Make sure you discuss any questions you have with your health care provider.  

## 2014-04-26 NOTE — Progress Notes (Signed)
PATIENT: William Townsend DOB: [birth year]  REASON FOR VISIT: follow up HISTORY FROM: patient  HISTORY OF PRESENT ILLNESS: Mr. Schreifels is a 53 year old male with a history of seizures. He returns today for followup. 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 He had a craniotomy and whole brain radiation and chemotherapy. His first seizure was a staring spell and he lost control of his bowels and bladder. He is currently taking Keppra 500 mg BID and is tolerating it well. He denies having any additional seizures. Patient had an EEG study that was unremarkable. He does not operate a motor vehicle- He has some vision issues that keeps him from driving. He states that he recently started the Chemotherapy back. 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 His doctor placed him on gabapentin. He states that he can really tell his balance has been affected. He uses a walker when needed. He has a shower chair but it does not fit in his shower. He currently lives with two friends.    REVIEW OF SYSTEMS: Full 14 system review of systems performed and notable only for:  Constitutional: N/A  Eyes: Blurred vision Ear/Nose/Throat: Hearing loss, ringing in the ears Skin: N/A  Cardiovascular: N/A  Respiratory: N/A  Gastrointestinal: Diarrhea  Genitourinary: Frequency of urination Hematology/Lymphatic: N/A  Endocrine: Cold intolerance Musculoskeletal: Walking difficulty Allergy/Immunology: N/A  Neurological: Memory loss Psychiatric: N/A Sleep: N/A   ALLERGIES: Allergies  Allergen Reactions  . Vicodin [Hydrocodone-Acetaminophen] Itching    HOME MEDICATIONS: Outpatient Prescriptions Prior to Visit  Medication Sig Dispense Refill  . Blood Glucose Monitoring Suppl (ACCU-CHEK NANO SMARTVIEW) W/DEVICE KIT Measure blood sugar once daily prior to taking amaryl in the morning.  1 kit  0  . feeding supplement, ENSURE COMPLETE, (ENSURE  COMPLETE) LIQD Take 237 mLs by mouth 2 (two) times daily between meals.  30 Bottle  0  . folic acid (FOLVITE) 1 MG tablet TAKE 1 TABLET BY MOUTH ONCE DAILY  30 tablet  2  . gabapentin (NEURONTIN) 100 MG capsule Take 1 capsule (100 mg total) by mouth 3 (three) times daily.  90 capsule  2  . glimepiride (AMARYL) 2 MG tablet Take 1 tablet (2 mg total) by mouth daily with breakfast.  30 tablet  2  . glucose blood (ACCU-CHEK ACTIVE STRIPS) test strip Use as instructed  100 each  12  . guaiFENesin (MUCINEX) 600 MG 12 hr tablet Take 1 tablet (600 mg total) by mouth 2 (two) times daily.  30 tablet  0  . Lancet Devices (ACCU-CHEK SOFTCLIX) lancets Use as instructed  1 each  3  . levETIRAcetam (KEPPRA) 500 MG tablet Take 500 mg by mouth 2 (two) times daily. One half tablet twice a day for 2 weeks, then take one tablet twice a day      . loratadine (CLARITIN) 10 MG tablet Take 1 tablet (10 mg total) by mouth daily as needed for allergies.  30 tablet  0  . omeprazole (PRILOSEC) 20 MG capsule Take 1 capsule (20 mg total) by mouth daily.  30 capsule  0  . ondansetron (ZOFRAN) 8 MG tablet Take 1 tablet (8 mg total) by mouth every 8 (eight) hours as needed for nausea or vomiting (start on the 3rd day after chemotherapy.).  30 tablet  1  . polyethylene glycol (MIRALAX / GLYCOLAX) packet Take 17 g by mouth daily.      Marland Kitchen  thiamine 100 MG tablet Take 1 tablet (100 mg total) by mouth daily.  30 tablet  2  . docusate sodium (COLACE) 100 MG capsule Take 1 capsule (100 mg total) by mouth 2 (two) times daily.  20 capsule  0  . magnesium oxide (MAG-OX) 400 (241.3 MG) MG tablet TAKE 1 TABLET BY MOUTH TWICE A DAY  60 tablet  2  . zolpidem (AMBIEN) 5 MG tablet Take 1 tablet (5 mg total) by mouth at bedtime as needed for sleep.  15 tablet  1   Facility-Administered Medications Prior to Visit  Medication Dose Route Frequency Provider Last Rate Last Dose  . heparin lock flush 100 unit/mL  500 Units Intravenous Once Curt Bears, MD      . sodium chloride 0.9 % injection 10 mL  10 mL Intravenous PRN Curt Bears, MD   10 mL at 01/06/14 0830    PAST MEDICAL HISTORY: Past Medical History  Diagnosis Date  . Atrial fibrillation   . Diabetes mellitus   . 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 Allergy   . Substance abuse   . Depression   . Stroke     3 previous strokes - Oct, Nov, and Dec 2012  . Throat cancer     September 2008  . Lung cancer     lobectomy in 2010  . Heart murmur   . Hx of radiation therapy 05/18/13- 06/01/13    whole brain, 30 Gy, 10 fractions    PAST SURGICAL HISTORY: Past Surgical History  Procedure Laterality Date  . Lobectomy    . Craniotomy Left 04/27/2013    Procedure: Left Occipital Craniotomy for resection of tumor with microscope;  Surgeon: Floyce Stakes, MD;  Location: [facility];  Service: Neurosurgery;  Laterality: Left;    FAMILY HISTORY: Family History  Problem Relation Age of Onset  . Diabetes Mother   . Early death Mother   . Heart disease Father   . Stroke Father   . Asthma Sister   . Depression Sister   . Seizures Neg Hx     SOCIAL HISTORY: History   Social History  . Marital Status: Divorced    Spouse Name: N/A    Number of Children: 2  . Years of Education: GED   Occupational History  . disability     former Architect   Social History Main Topics  . Smoking status: Current Some Day Smoker -- 0.50 packs/day for 30 years    Types: Cigarettes  . Smokeless tobacco: Never Used  . Alcohol Use: 0.0 oz/week    1-2 Cans of beer per week     Comment: couple beers at night  . Drug Use: No  . Sexual Activity: No   Other Topics Concern  . Not on file   Social History Narrative   Patient is divorced, has 2 children.   Patient is right handed.   Patient has his GED.   Patient drinks 4 cups daily.      PHYSICAL EXAM  Filed Vitals:   04/26/14 0851  BP: 150/89  Pulse: 91  Height: 6' (1.829 m)  Weight: 164 lb (74.39 kg)   Body mass  index is 22.24 kg/(m^2).  Generalized: Well developed, in no acute distress   Neurological examination  Mentation: Alert oriented to time, place, history taking. Follows all commands speech and language fluent Cranial nerve II-XII: Pupils were equal round reactive to light. Extraocular movements were full, visual field were full on  confrontational test. Facial sensation and strength were normal. hearing was intact to finger rubbing bilaterally. Uvula tongue midline. Head turning and shoulder shrug  were normal and symmetric. Motor: The motor testing reveals 5 over 5 strength of all 4 extremities. Good symmetric motor tone is noted throughout.  Sensory: Sensory testing is intact to soft touch on all 4 extremities. No evidence of extinction is noted.  Coordination: Cerebellar testing reveals good finger-nose-finger and heel-to-shin bilaterally.  Gait and station: Gait is wide based. Tandem gait is slightly unsteady. Romberg is positive. No drift is seen.  Reflexes: Deep tendon reflexes are symmetric and normal bilaterally.    DIAGNOSTIC DATA (LABS, IMAGING, TESTING) - I reviewed patient records, labs, notes, testing and imaging myself where available.  Lab Results  Component Value Date   WBC 3.3* 04/21/2014   HGB 13.2 04/21/2014   HCT 37.8* 04/21/2014   MCV 104.7* 04/21/2014   PLT 122* 04/21/2014      Component Value Date/Time   NA 139 04/21/2014 0835   NA 138 02/11/2014 0429   K 4.3 04/21/2014 0835   K 3.5* 02/11/2014 0429   CL 101 02/11/2014 0429   CO2 28 04/21/2014 0835   CO2 26 02/11/2014 0429   GLUCOSE 270* 04/21/2014 0835   GLUCOSE 119* 02/11/2014 0429   BUN 13.1 04/21/2014 0835   BUN 5* 02/11/2014 0429   CREATININE 1.0 04/21/2014 0835   CREATININE 0.74 02/11/2014 0429   CREATININE 1.04 01/28/2012 1454   CALCIUM 9.4 04/21/2014 0835   CALCIUM 8.4 02/11/2014 0429   PROT 7.0 04/21/2014 0835   PROT 6.0 02/10/2014 0302   ALBUMIN 3.5 04/21/2014 0835   ALBUMIN 3.0* 02/10/2014 0302   AST 71* 04/21/2014 0835     AST 49* 02/10/2014 0302   ALT 61* 04/21/2014 0835   ALT 21 02/10/2014 0302   ALKPHOS 207* 04/21/2014 0835   ALKPHOS 130* 02/10/2014 0302   BILITOT 0.92 04/21/2014 0835   BILITOT 1.5* 02/10/2014 0302   GFRNONAA >90 02/11/2014 0429   GFRAA >90 02/11/2014 0429    Lab Results  Component Value Date   HGBA1C 7.3 01/21/2014   Lab Results  Component Value Date   VITAMINB12 291 01/11/2014       ASSESSMENT AND PLAN 53 y.o. 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 here with:  1. seizures 2. Abnormality of gait  Patient has been seizure-free on Keppra 500 mg twice a day. He should continue taking this as prescribed. Patient is currently not operating a motor vehicle and has not been operating a vehicle for 3 years due to vision changes. Patient has a wide-based gait. He also has some issues with balance. This is most likely due to neuropathy secondary to diabetes and possibly chemotherapy. Patient notices increased difficulty when taking a shower. He states he has to hold on to something or else he will lose his balance. I advised the patient to look into getting a shower chair. He states that he will check his local medical supply store to see if they make one that will fit in his shower. I will  provide a prescription if needed. He should followup with [place] in 6 months or sooner if needed.  Ward Givens, MSN, NP-C 04/26/2014, 9:11 AM [facility] [address] [place], [place] [address] [phone]  Note: This  document was prepared with digital dictation and possible smart phrase technology. Any transcriptional errors that result from this process are unintentional.

## 2014-04-28 ENCOUNTER — Ambulatory Visit: Payer: Medicaid Other | Admitting: Nurse Practitioner

## 2014-04-28 ENCOUNTER — Other Ambulatory Visit (HOSPITAL_BASED_OUTPATIENT_CLINIC_OR_DEPARTMENT_OTHER): Payer: Medicaid Other

## 2014-04-28 DIAGNOSIS — C3490 Malignant neoplasm of unspecified part of unspecified bronchus or lung: Secondary | ICD-10-CM

## 2014-04-28 DIAGNOSIS — C787 Secondary malignant neoplasm of liver and intrahepatic bile duct: Secondary | ICD-10-CM

## 2014-04-28 DIAGNOSIS — C3410 Malignant neoplasm of upper lobe, unspecified bronchus or lung: Secondary | ICD-10-CM

## 2014-04-28 LAB — COMPREHENSIVE METABOLIC PANEL (CC13)
ALBUMIN: 3.6 g/dL (ref 3.5–5.0)
ALT: 43 U/L (ref 0–55)
AST: 48 U/L — AB (ref 5–34)
Alkaline Phosphatase: 209 U/L — ABNORMAL HIGH (ref 40–150)
Anion Gap: 12 mEq/L — ABNORMAL HIGH (ref 3–11)
BUN: 9.4 mg/dL (ref 7.0–26.0)
CALCIUM: 9 mg/dL (ref 8.4–10.4)
CHLORIDE: 101 meq/L (ref 98–109)
CO2: 25 mEq/L (ref 22–29)
Creatinine: 0.8 mg/dL (ref 0.7–1.3)
Glucose: 229 mg/dl — ABNORMAL HIGH (ref 70–140)
POTASSIUM: 3.8 meq/L (ref 3.5–5.1)
SODIUM: 139 meq/L (ref 136–145)
TOTAL PROTEIN: 7.1 g/dL (ref 6.4–8.3)
Total Bilirubin: 1.13 mg/dL (ref 0.20–1.20)

## 2014-04-28 LAB — CBC WITH DIFFERENTIAL/PLATELET
BASO%: 0.3 % (ref 0.0–2.0)
Basophils Absolute: 0 10*3/uL (ref 0.0–0.1)
EOS%: 2 % (ref 0.0–7.0)
Eosinophils Absolute: 0.1 10*3/uL (ref 0.0–0.5)
HCT: 38.3 % — ABNORMAL LOW (ref 38.4–49.9)
HGB: 13.3 g/dL (ref 13.0–17.1)
LYMPH%: 22.1 % (ref 14.0–49.0)
MCH: 36.2 pg — ABNORMAL HIGH (ref 27.2–33.4)
MCHC: 34.8 g/dL (ref 32.0–36.0)
MCV: 103.9 fL — ABNORMAL HIGH (ref 79.3–98.0)
MONO#: 0.4 10*3/uL (ref 0.1–0.9)
MONO%: 12.4 % (ref 0.0–14.0)
NEUT#: 2.2 10*3/uL (ref 1.5–6.5)
NEUT%: 63.2 % (ref 39.0–75.0)
Platelets: 158 10*3/uL (ref 140–400)
RBC: 3.68 10*6/uL — ABNORMAL LOW (ref 4.20–5.82)
RDW: 14.7 % — ABNORMAL HIGH (ref 11.0–14.6)
WBC: 3.5 10*3/uL — ABNORMAL LOW (ref 4.0–10.3)
lymph#: 0.8 10*3/uL — ABNORMAL LOW (ref 0.9–3.3)

## 2014-04-28 LAB — MAGNESIUM (CC13): Magnesium: 1.4 mg/dl — CL (ref 1.5–2.5)

## 2014-05-05 ENCOUNTER — Ambulatory Visit: Payer: Medicaid Other

## 2014-05-05 ENCOUNTER — Ambulatory Visit (HOSPITAL_BASED_OUTPATIENT_CLINIC_OR_DEPARTMENT_OTHER): Payer: Medicaid Other | Admitting: Internal Medicine

## 2014-05-05 ENCOUNTER — Ambulatory Visit (HOSPITAL_BASED_OUTPATIENT_CLINIC_OR_DEPARTMENT_OTHER): Payer: Medicaid Other

## 2014-05-05 ENCOUNTER — Encounter: Payer: Self-pay | Admitting: Internal Medicine

## 2014-05-05 ENCOUNTER — Other Ambulatory Visit (HOSPITAL_BASED_OUTPATIENT_CLINIC_OR_DEPARTMENT_OTHER): Payer: Medicaid Other

## 2014-05-05 VITALS — BP 139/93 | HR 99 | Temp 98.2°F | Resp 19 | Ht 72.0 in | Wt 165.7 lb

## 2014-05-05 DIAGNOSIS — C787 Secondary malignant neoplasm of liver and intrahepatic bile duct: Secondary | ICD-10-CM

## 2014-05-05 DIAGNOSIS — Z85118 Personal history of other malignant neoplasm of bronchus and lung: Secondary | ICD-10-CM

## 2014-05-05 DIAGNOSIS — C3490 Malignant neoplasm of unspecified part of unspecified bronchus or lung: Secondary | ICD-10-CM

## 2014-05-05 DIAGNOSIS — C3410 Malignant neoplasm of upper lobe, unspecified bronchus or lung: Secondary | ICD-10-CM

## 2014-05-05 DIAGNOSIS — G40802 Other epilepsy, not intractable, without status epilepticus: Secondary | ICD-10-CM

## 2014-05-05 DIAGNOSIS — R911 Solitary pulmonary nodule: Secondary | ICD-10-CM

## 2014-05-05 DIAGNOSIS — Z95828 Presence of other vascular implants and grafts: Secondary | ICD-10-CM

## 2014-05-05 DIAGNOSIS — C7889 Secondary malignant neoplasm of other digestive organs: Secondary | ICD-10-CM

## 2014-05-05 DIAGNOSIS — Z5111 Encounter for antineoplastic chemotherapy: Secondary | ICD-10-CM

## 2014-05-05 LAB — CBC WITH DIFFERENTIAL/PLATELET
BASO%: 0.3 % (ref 0.0–2.0)
Basophils Absolute: 0 10*3/uL (ref 0.0–0.1)
EOS%: 3 % (ref 0.0–7.0)
Eosinophils Absolute: 0.1 10*3/uL (ref 0.0–0.5)
HCT: 38.1 % — ABNORMAL LOW (ref 38.4–49.9)
HGB: 13.5 g/dL (ref 13.0–17.1)
LYMPH%: 30.2 % (ref 14.0–49.0)
MCH: 36.1 pg — AB (ref 27.2–33.4)
MCHC: 35.4 g/dL (ref 32.0–36.0)
MCV: 101.9 fL — ABNORMAL HIGH (ref 79.3–98.0)
MONO#: 0.4 10*3/uL (ref 0.1–0.9)
MONO%: 10.4 % (ref 0.0–14.0)
NEUT#: 1.9 10*3/uL (ref 1.5–6.5)
NEUT%: 56.1 % (ref 39.0–75.0)
PLATELETS: 144 10*3/uL (ref 140–400)
RBC: 3.74 10*6/uL — AB (ref 4.20–5.82)
RDW: 14.2 % (ref 11.0–14.6)
WBC: 3.4 10*3/uL — ABNORMAL LOW (ref 4.0–10.3)
lymph#: 1 10*3/uL (ref 0.9–3.3)

## 2014-05-05 LAB — COMPREHENSIVE METABOLIC PANEL (CC13)
ALT: 36 U/L (ref 0–55)
AST: 79 U/L — ABNORMAL HIGH (ref 5–34)
Albumin: 3.3 g/dL — ABNORMAL LOW (ref 3.5–5.0)
Alkaline Phosphatase: 220 U/L — ABNORMAL HIGH (ref 40–150)
Anion Gap: 12 mEq/L — ABNORMAL HIGH (ref 3–11)
BILIRUBIN TOTAL: 0.82 mg/dL (ref 0.20–1.20)
BUN: 5.9 mg/dL — ABNORMAL LOW (ref 7.0–26.0)
CO2: 25 mEq/L (ref 22–29)
CREATININE: 0.8 mg/dL (ref 0.7–1.3)
Calcium: 8.8 mg/dL (ref 8.4–10.4)
Chloride: 105 mEq/L (ref 98–109)
Glucose: 183 mg/dl — ABNORMAL HIGH (ref 70–140)
Potassium: 3.9 mEq/L (ref 3.5–5.1)
SODIUM: 142 meq/L (ref 136–145)
TOTAL PROTEIN: 6.7 g/dL (ref 6.4–8.3)

## 2014-05-05 LAB — MAGNESIUM (CC13): MAGNESIUM: 1.7 mg/dL (ref 1.5–2.5)

## 2014-05-05 MED ORDER — ATROPINE SULFATE 1 MG/ML IJ SOLN
0.5000 mg | Freq: Once | INTRAMUSCULAR | Status: AC | PRN
  Administered 2014-05-05: 0.5 mg via INTRAVENOUS

## 2014-05-05 MED ORDER — SODIUM CHLORIDE 0.9 % IV SOLN
Freq: Once | INTRAVENOUS | Status: AC
  Administered 2014-05-05: 11:00:00 via INTRAVENOUS

## 2014-05-05 MED ORDER — DEXAMETHASONE SODIUM PHOSPHATE 10 MG/ML IJ SOLN
INTRAMUSCULAR | Status: AC
  Filled 2014-05-05: qty 1

## 2014-05-05 MED ORDER — DEXTROSE 5 % IV SOLN
65.0000 mg/m2 | Freq: Once | INTRAVENOUS | Status: AC
  Administered 2014-05-05: 126 mg via INTRAVENOUS
  Filled 2014-05-05: qty 6.3

## 2014-05-05 MED ORDER — DEXAMETHASONE SODIUM PHOSPHATE 10 MG/ML IJ SOLN
10.0000 mg | Freq: Once | INTRAMUSCULAR | Status: AC
  Administered 2014-05-05: 10 mg via INTRAVENOUS

## 2014-05-05 MED ORDER — SODIUM CHLORIDE 0.9 % IJ SOLN
10.0000 mL | INTRAMUSCULAR | Status: DC | PRN
  Administered 2014-05-05: 10 mL via INTRAVENOUS
  Filled 2014-05-05: qty 10

## 2014-05-05 MED ORDER — SODIUM CHLORIDE 0.9 % IJ SOLN
10.0000 mL | INTRAMUSCULAR | Status: DC | PRN
  Administered 2014-05-05: 10 mL
  Filled 2014-05-05: qty 10

## 2014-05-05 MED ORDER — ATROPINE SULFATE 1 MG/ML IJ SOLN
INTRAMUSCULAR | Status: AC
  Filled 2014-05-05: qty 1

## 2014-05-05 MED ORDER — HEPARIN SOD (PORK) LOCK FLUSH 100 UNIT/ML IV SOLN
500.0000 [IU] | Freq: Once | INTRAVENOUS | Status: AC | PRN
  Administered 2014-05-05: 500 [IU]
  Filled 2014-05-05: qty 5

## 2014-05-05 MED ORDER — ONDANSETRON 8 MG/50ML IVPB (CHCC)
8.0000 mg | Freq: Once | INTRAVENOUS | Status: AC
  Administered 2014-05-05: 8 mg via INTRAVENOUS

## 2014-05-05 MED ORDER — ONDANSETRON 8 MG/NS 50 ML IVPB
INTRAVENOUS | Status: AC
  Filled 2014-05-05: qty 8

## 2014-05-05 NOTE — Patient Instructions (Signed)

## 2014-05-05 NOTE — Progress Notes (Signed)
[place]  Telephone:([phone]) [phone] [phone]  PROGRESS NOTE  William Putnam, DO [place] [address]  DIAGNOSIS: Extensive Stage small cell lung cancer diagnosed in September of 2014.  PRIOR THERAPY:  1) Systemic chemotherapy with carboplatin for an AUC of 5 given on day 1 and etoposide 120 mg per meter squared given on days 1, 2 and 3 with Neulasta support given on day 4 status post 2 cycles. Carboplatin was reduced to AUC of 4 and etoposide 100 mg/M2 starting from cycle #6, last dose was 09/20/2013. 2) Systemic chemotherapy with cisplatin 30 mg/M2 and irinotecan 65 mg/M2 on days 1 and 8 every 3 weeks first dose today 01/06/2014. Cisplatin was discontinued after cycle #1 secondary to hypersensitivity reaction.  CURRENT THERAPY: Systemic chemotherapy with single agent irinotecan 65 mg/M2 on days 1 and 8 every 3 weeks. First dose 02/18/2014. Status post 3 cycles.  DISEASE STAGE: Extensive stage  CHEMOTHERAPY INTENT: Palliative  CURRENT # OF CHEMOTHERAPY CYCLES: 4 CURRENT ANTIEMETICS: Zofran, dexamethasone, Compazine  CURRENT SMOKING STATUS: Current smoker, strongly advised to quit smoking  ORAL CHEMOTHERAPY AND CONSENT: N./A.  CURRENT BISPHOSPHONATES USE: None  PAIN MANAGEMENT: Percocet  NARCOTICS INDUCED CONSTIPATION: None  LIVING WILL AND CODE STATUS: ?  INTERVAL HISTORY: William Townsend 53 y.o. male returns for a follow up visit. The patient is feeling a little bit better today. He is tolerating his treatment with single agent irinotecan fairly well with no significant adverse effects.  He has some improvement in his liver enzymes after he decreased the amount of alcohol that he was drinking at regular basis. Is not completely quit alcohol. He denied having any significant chest pain, shortness of breath, cough or hemoptysis. The patient has no nausea or vomiting. He has no fever or chills. He has no significant weight  loss or night sweats. She is here today to start cycle #4.  MEDICAL HISTORY: Past Medical History  Diagnosis Date  . Atrial fibrillation   . Diabetes mellitus   . 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 Allergy   . Substance abuse   . Depression   . Stroke     3 previous strokes - Oct, Nov, and Dec 2012  . Throat cancer     September 2008  . Lung cancer     lobectomy in 2010  . Heart murmur   . Hx of radiation therapy 05/18/13- 06/01/13    whole brain, 30 Gy, 10 fractions    ALLERGIES:  is allergic to vicodin.  MEDICATIONS:  Current Outpatient Prescriptions  Medication Sig Dispense Refill  . Blood Glucose Monitoring Suppl (ACCU-CHEK NANO SMARTVIEW) W/DEVICE KIT Measure blood sugar once daily prior to taking amaryl in the morning.  1 kit  0  . feeding supplement, ENSURE COMPLETE, (ENSURE COMPLETE) LIQD Take 237 mLs by mouth 2 (two) times daily between meals.  30 Bottle  0  . folic acid (FOLVITE) 1 MG tablet TAKE 1 TABLET BY MOUTH ONCE DAILY  30 tablet  2  . gabapentin (NEURONTIN) 100 MG capsule Take 1 capsule (100 mg total) by mouth 3 (three) times daily.  90 capsule  2  . glimepiride (AMARYL) 2 MG tablet Take 1 tablet (2 mg total) by mouth daily with breakfast.  30 tablet  2  . glucose blood (ACCU-CHEK ACTIVE STRIPS) test strip Use as instructed  100 each  12  . guaiFENesin (MUCINEX) 600 MG 12 hr tablet Take 1 tablet (600 mg total) by  mouth 2 (two) times daily.  30 tablet  0  . Lancet Devices (ACCU-CHEK SOFTCLIX) lancets Use as instructed  1 each  3  . levETIRAcetam (KEPPRA) 500 MG tablet Take 500 mg by mouth 2 (two) times daily. One half tablet twice a day for 2 weeks, then take one tablet twice a day      . loratadine (CLARITIN) 10 MG tablet Take 1 tablet (10 mg total) by mouth daily as needed for allergies.  30 tablet  0  . magnesium oxide (MAG-OX) 400 (241.3 MG) MG tablet TAKE 1 TABLET BY MOUTH THREE TIMES DAILY      . omeprazole (PRILOSEC) 20 MG capsule Take 1 capsule (20 mg total)  by mouth daily.  30 capsule  0  . polyethylene glycol (MIRALAX / GLYCOLAX) packet Take 17 g by mouth daily.      Marland Kitchen thiamine 100 MG tablet Take 1 tablet (100 mg total) by mouth daily.  30 tablet  2  . ondansetron (ZOFRAN) 8 MG tablet Take 1 tablet (8 mg total) by mouth every 8 (eight) hours as needed for nausea or vomiting (start on the 3rd day after chemotherapy.).  30 tablet  1   No current facility-administered medications for this visit.   Facility-Administered Medications Ordered in Other Visits  Medication Dose Route Frequency Provider Last Rate Last Dose  . heparin lock flush 100 unit/mL  500 Units Intravenous Once Curt Bears, MD      . sodium chloride 0.9 % injection 10 mL  10 mL Intravenous PRN Curt Bears, MD   10 mL at 01/06/14 0830    SURGICAL HISTORY:  Past Surgical History  Procedure Laterality Date  . Lobectomy    . Craniotomy Left 04/27/2013    Procedure: Left Occipital Craniotomy for resection of tumor with microscope;  Surgeon: Floyce Stakes, MD;  Location: [facility];  Service: Neurosurgery;  Laterality: Left;    REVIEW OF SYSTEMS:  Constitutional: positive for fatigue Eyes: negative Ears, nose, mouth, throat, and face: negative Respiratory: negative Cardiovascular: negative Gastrointestinal: negative Genitourinary:negative Integument/breast: negative Hematologic/lymphatic: negative Musculoskeletal:negative Neurological: negative Behavioral/Psych: negative Endocrine: negative Allergic/Immunologic: negative   PHYSICAL EXAMINATION: General appearance: alert, cooperative and no distress Head: Normocephalic, without obvious abnormality, atraumatic Neck: no adenopathy, no carotid bruit, no JVD, supple, symmetrical, trachea midline and thyroid not enlarged, symmetric, no tenderness/mass/nodules Lymph nodes: Cervical, supraclavicular, and axillary nodes normal. Resp: clear to auscultation bilaterally Cardio: regular rate and rhythm, S1, S2 normal, no  murmur, click, rub or gallop GI: soft, non-tender; bowel sounds normal; no masses,  no organomegaly Extremities: extremities normal, atraumatic, no cyanosis or edema Neurologic: Alert and oriented X 3, normal strength and tone. Normal symmetric reflexes. Normal coordination and gait  ECOG PERFORMANCE STATUS: 1 - Symptomatic but completely ambulatory  Blood pressure 139/93, pulse 99, temperature 98.2 F (36.8 C), temperature source Oral, resp. rate 19, height 6' (1.829 m), weight 165 lb 11.2 oz (75.161 kg).  LABORATORY DATA: Lab Results  Component Value Date   WBC 3.4* 05/05/2014   HGB 13.5 05/05/2014   HCT 38.1* 05/05/2014   MCV 101.9* 05/05/2014   PLT 144 05/05/2014      Chemistry      Component Value Date/Time   NA 142 05/05/2014 0915   NA 138 02/11/2014 0429   K 3.9 05/05/2014 0915   K 3.5* 02/11/2014 0429   CL 101 02/11/2014 0429   CO2 25 05/05/2014 0915   CO2 26 02/11/2014 0429   BUN 5.9*  05/05/2014 0915   BUN 5* 02/11/2014 0429   CREATININE 0.8 05/05/2014 0915   CREATININE 0.74 02/11/2014 0429   CREATININE 1.04 01/28/2012 1454      Component Value Date/Time   CALCIUM 8.8 05/05/2014 0915   CALCIUM 8.4 02/11/2014 0429   ALKPHOS 220* 05/05/2014 0915   ALKPHOS 130* 02/10/2014 0302   AST 79* 05/05/2014 0915   AST 49* 02/10/2014 0302   ALT 36 05/05/2014 0915   ALT 21 02/10/2014 0302   BILITOT 0.82 05/05/2014 0915   BILITOT 1.5* 02/10/2014 0302       RADIOGRAPHIC STUDIES:  ASSESSMENT/PLAN:  This is a very pleasant 53 years old white male with extensive stage small cell lung cancer status post 6 cycles of systemic chemotherapy with carboplatin and etoposide. He was then tried on treatment with cisplatin and irinotecan but cisplatin was discontinued secondary to hypersensitivity reaction and the patient is currently continuing irinotecan as a single agent status post 3 cycles. He is tolerating his treatment fairly well. I recommended for the patient to proceed with cycle #4 today as scheduled.  He  would be out of town in 3 weeks at the time of starting cycle #5. I will delay cycle #5 x 1 week to accommodate his vacation schedule. He would come back for followup visit in 4 weeks for reevaluation before starting cycle #5. I also strongly encouraged the patient to quit alcohol drinking.  For the history of seizure activity, the patient will continue his current treatment with Keppra. He was advised to call immediately if he has any concerning symptoms in the interval. All questions were answered. The patient knows to call the clinic with any problems, questions or concerns. We can certainly see the patient much sooner if necessary.   Disclaimer: This note was dictated with voice recognition software. Similar sounding words can inadvertently be transcribed and may not be corrected upon review.  Eilleen Kempf., MD 05/05/2014

## 2014-05-05 NOTE — Patient Instructions (Signed)
Smoking Cessation Quitting smoking is important to your health and has many advantages. However, it is not always easy to quit since nicotine is a very addictive drug. Oftentimes, people try 3 times or more before being able to quit. This document explains the best ways for you to prepare to quit smoking. Quitting takes hard work and a lot of effort, but you can do it. ADVANTAGES OF QUITTING SMOKING  You will live longer, feel better, and live better.  Your body will feel the impact of quitting smoking almost immediately.  Within 20 minutes, blood pressure decreases. Your pulse returns to its normal level.  After 8 hours, carbon monoxide levels in the blood return to normal. Your oxygen level increases.  After 24 hours, the chance of having a heart attack starts to decrease. Your breath, hair, and body stop smelling like smoke.  After 48 hours, damaged nerve endings begin to recover. Your sense of taste and smell improve.  After 72 hours, the body is virtually free of nicotine. Your bronchial tubes relax and breathing becomes easier.  After 2 to 12 weeks, lungs can hold more air. Exercise becomes easier and circulation improves.  The risk of having a heart attack, stroke, cancer, or lung disease is greatly reduced.  After 1 year, the risk of coronary heart disease is cut in half.  After 5 years, the risk of stroke falls to the same as a nonsmoker.  After 10 years, the risk of lung cancer is cut in half and the risk of other cancers decreases significantly.  After 15 years, the risk of coronary heart disease drops, usually to the level of a nonsmoker.  If you are pregnant, quitting smoking will improve your chances of having a healthy baby.  The people you live with, especially any children, will be healthier.  You will have extra money to spend on things other than cigarettes. QUESTIONS TO THINK ABOUT BEFORE ATTEMPTING TO QUIT You may want to talk about your answers with your  health care provider.  Why do you want to quit?  If you tried to quit in the past, what helped and what did not?  What will be the most difficult situations for you after you quit? How will you plan to handle them?  Who can help you through the tough times? Your family? Friends? A health care provider?  What pleasures do you get from smoking? What ways can you still get pleasure if you quit? Here are some questions to ask your health care provider:  How can you help me to be successful at quitting?  What medicine do you think would be best for me and how should I take it?  What should I do if I need more help?  What is smoking withdrawal like? How can I get information on withdrawal? GET READY  Set a quit date.  Change your environment by getting rid of all cigarettes, ashtrays, matches, and lighters in your home, car, or work. Do not let people smoke in your home.  Review your past attempts to quit. Think about what worked and what did not. GET SUPPORT AND ENCOURAGEMENT You have a better chance of being successful if you have help. You can get support in many ways.  Tell your family, friends, and coworkers that you are going to quit and need their support. Ask them not to smoke around you.  Get individual, group, or telephone counseling and support. Programs are available at local hospitals and health centers. Call   your local health department for information about programs in your area.  Spiritual beliefs and practices may help some smokers quit.  Download a "quit meter" on your computer to keep track of quit statistics, such as how long you have gone without smoking, cigarettes not smoked, and money saved.  Get a self-help book about quitting smoking and staying off tobacco. LEARN NEW SKILLS AND BEHAVIORS  Distract yourself from urges to smoke. Talk to someone, go for a walk, or occupy your time with a task.  Change your normal routine. Take a different route to work.  Drink tea instead of coffee. Eat breakfast in a different place.  Reduce your stress. Take a hot bath, exercise, or read a book.  Plan something enjoyable to do every day. Reward yourself for not smoking.  Explore interactive web-based programs that specialize in helping you quit. GET MEDICINE AND USE IT CORRECTLY Medicines can help you stop smoking and decrease the urge to smoke. Combining medicine with the above behavioral methods and support can greatly increase your chances of successfully quitting smoking.  Nicotine replacement therapy helps deliver nicotine to your body without the negative effects and risks of smoking. Nicotine replacement therapy includes nicotine gum, lozenges, inhalers, nasal sprays, and skin patches. Some may be available over-the-counter and others require a prescription.  Antidepressant medicine helps people abstain from smoking, but how this works is unknown. This medicine is available by prescription.  Nicotinic receptor partial agonist medicine simulates the effect of nicotine in your brain. This medicine is available by prescription. Ask your health care provider for advice about which medicines to use and how to use them based on your health history. Your health care provider will tell you what side effects to look out for if you choose to be on a medicine or therapy. Carefully read the information on the package. Do not use any other product containing nicotine while using a nicotine replacement product.  RELAPSE OR DIFFICULT SITUATIONS Most relapses occur within the first 3 months after quitting. Do not be discouraged if you start smoking again. Remember, most people try several times before finally quitting. You may have symptoms of withdrawal because your body is used to nicotine. You may crave cigarettes, be irritable, feel very hungry, cough often, get headaches, or have difficulty concentrating. The withdrawal symptoms are only temporary. They are strongest  when you first quit, but they will go away within 10-14 days. To reduce the chances of relapse, try to:  Avoid drinking alcohol. Drinking lowers your chances of successfully quitting.  Reduce the amount of caffeine you consume. Once you quit smoking, the amount of caffeine in your body increases and can give you symptoms, such as a rapid heartbeat, sweating, and anxiety.  Avoid smokers because they can make you want to smoke.  Do not let weight gain distract you. Many smokers will gain weight when they quit, usually less than 10 pounds. Eat a healthy diet and stay active. You can always lose the weight gained after you quit.  Find ways to improve your mood other than smoking. FOR MORE INFORMATION  www.smokefree.gov  Document Released: 08/20/2001 Document Revised: 01/10/2014 Document Reviewed: 12/05/2011 [employer] Patient Information 2015 [employer]. This information is not intended to replace advice given to you by your health care provider. Make sure you discuss any questions you have with your health care provider.  

## 2014-05-05 NOTE — Patient Instructions (Signed)
[facility] Discharge Instructions for Patients Receiving Chemotherapy  Today you received the following chemotherapy agents: Irinotecan.  To help prevent nausea and vomiting after your treatment, we encourage you to take your nausea medication: Zofran 8 mg every 8 hours as needed.   If you develop nausea and vomiting that is not controlled by your nausea medication, call the clinic.   BELOW ARE SYMPTOMS THAT SHOULD BE REPORTED IMMEDIATELY:  *FEVER GREATER THAN 100.5 F  *CHILLS WITH OR WITHOUT FEVER  NAUSEA AND VOMITING THAT IS NOT CONTROLLED WITH YOUR NAUSEA MEDICATION  *UNUSUAL SHORTNESS OF BREATH  *UNUSUAL BRUISING OR BLEEDING  TENDERNESS IN MOUTH AND THROAT WITH OR WITHOUT PRESENCE OF ULCERS  *URINARY PROBLEMS  *BOWEL PROBLEMS  UNUSUAL RASH Items with * indicate a potential emergency and should be followed up as soon as possible.  Feel free to call the clinic you have any questions or concerns. The clinic phone number is [phone].

## 2014-05-06 ENCOUNTER — Encounter: Payer: Self-pay | Admitting: Family Medicine

## 2014-05-06 ENCOUNTER — Ambulatory Visit (INDEPENDENT_AMBULATORY_CARE_PROVIDER_SITE_OTHER): Payer: Medicaid Other | Admitting: Family Medicine

## 2014-05-06 ENCOUNTER — Telehealth: Payer: Self-pay | Admitting: Internal Medicine

## 2014-05-06 VITALS — BP 131/92 | HR 112 | Temp 98.5°F | Ht 72.0 in | Wt 168.3 lb

## 2014-05-06 DIAGNOSIS — M545 Low back pain, unspecified: Secondary | ICD-10-CM

## 2014-05-06 DIAGNOSIS — E1142 Type 2 diabetes mellitus with diabetic polyneuropathy: Secondary | ICD-10-CM

## 2014-05-06 DIAGNOSIS — E1149 Type 2 diabetes mellitus with other diabetic neurological complication: Secondary | ICD-10-CM

## 2014-05-06 DIAGNOSIS — M549 Dorsalgia, unspecified: Secondary | ICD-10-CM | POA: Insufficient documentation

## 2014-05-06 LAB — POCT GLYCOSYLATED HEMOGLOBIN (HGB A1C): Hemoglobin A1C: 6.5

## 2014-05-06 MED ORDER — DICLOFENAC SODIUM 1 % TD GEL: 2.0000 g | Freq: Four times a day (QID) | TRANSDERMAL | Status: AC

## 2014-05-06 MED ORDER — NORTRIPTYLINE HCL 25 MG PO CAPS: 25.0000 mg | ORAL_CAPSULE | Freq: Every day | ORAL | Status: DC

## 2014-05-06 NOTE — Telephone Encounter (Signed)
lvm for pt regarding to Sept appts...advised pt to pick up extended sched

## 2014-05-06 NOTE — Patient Instructions (Signed)
It was a pleasure to see you today.  I believe your back pain is muscular in nature.   I recommend heat pack as often as you can do it; gentle stretching exercises to prevent the muscles from becoming stiff.   Voltaren gel, apply to affected area every 6 hours as needed for pain.   Nortriptyline 25mg  one capsule by mouth at bedtime, which should help you to sleep.  Please follow up with Dr Parks Ranger in the coming month, or sooner if you have worsening of the symptoms related to your back pain.

## 2014-05-06 NOTE — Progress Notes (Signed)
   Subjective:    Patient ID: William Townsend, male    DOB: [birth year], 53 y.o.   MRN: [id number]  HPI Patient seen today as SDA for acute onset low back pain along the Right lumbar region with onset on Monday August 17th. Was walking to take out the trash, slipped and strained low back without fall or striking the ground. Instantaneous sharp low back pain. Has been painful to sit, stand or walk since then. Has been taking Advil 8 tablets a day which helps with the pain for awhile. Denies saddle anesthesia, urine or bowel incontinence, lower extremity weakness.   Patient's PMHx reviewed. 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    Review of Systems     Objective:   Physical Exam Alert, converses in fluid speech. No apparent distress.  HEENT Neck supple.  MSK: No point tenderness over spinal vertebral processes in LS or T spine. Mild tenderness along paraspinous mm on L lumbar region.  SLR with limitation to around 45 degrees on R; full passive ROM R and L hips with flexion/extension, ab/adduction, internal and external rotation of both hips. Able to get up to exam table wihtout assist.  Great toe flexion/extension strength full and symmetric bilaterally. Sensation grossly intact in both feet, palpable dp pulses bilaterally.        Assessment & Plan:

## 2014-05-06 NOTE — Assessment & Plan Note (Addendum)
Acute right lower back pain; suspect MSK based on history, exam. Supportive therapy, including topical NSAID, heat. Patient reports some difficulty sleeping; prescribed low-dose TCA for sleep. He reports that Flexeril has not agreed with him in the past and prefers not to take.  Discussed warning signs for more severe causes of back pain. For follow up with primary physician in coming 1 month.

## 2014-05-12 ENCOUNTER — Ambulatory Visit (HOSPITAL_BASED_OUTPATIENT_CLINIC_OR_DEPARTMENT_OTHER): Payer: Medicaid Other

## 2014-05-12 ENCOUNTER — Ambulatory Visit: Payer: Medicaid Other

## 2014-05-12 ENCOUNTER — Other Ambulatory Visit (HOSPITAL_BASED_OUTPATIENT_CLINIC_OR_DEPARTMENT_OTHER): Payer: Medicaid Other

## 2014-05-12 VITALS — BP 157/88 | HR 94 | Temp 98.5°F | Resp 18

## 2014-05-12 DIAGNOSIS — Z5111 Encounter for antineoplastic chemotherapy: Secondary | ICD-10-CM

## 2014-05-12 DIAGNOSIS — C787 Secondary malignant neoplasm of liver and intrahepatic bile duct: Secondary | ICD-10-CM

## 2014-05-12 DIAGNOSIS — R911 Solitary pulmonary nodule: Secondary | ICD-10-CM

## 2014-05-12 DIAGNOSIS — C3410 Malignant neoplasm of upper lobe, unspecified bronchus or lung: Secondary | ICD-10-CM

## 2014-05-12 DIAGNOSIS — Z95828 Presence of other vascular implants and grafts: Secondary | ICD-10-CM

## 2014-05-12 DIAGNOSIS — Z85118 Personal history of other malignant neoplasm of bronchus and lung: Secondary | ICD-10-CM

## 2014-05-12 DIAGNOSIS — C3490 Malignant neoplasm of unspecified part of unspecified bronchus or lung: Secondary | ICD-10-CM

## 2014-05-12 LAB — CBC WITH DIFFERENTIAL/PLATELET
BASO%: 0.5 % (ref 0.0–2.0)
BASOS ABS: 0 10*3/uL (ref 0.0–0.1)
EOS ABS: 0 10*3/uL (ref 0.0–0.5)
EOS%: 1.4 % (ref 0.0–7.0)
HCT: 37.1 % — ABNORMAL LOW (ref 38.4–49.9)
HEMOGLOBIN: 12.9 g/dL — AB (ref 13.0–17.1)
LYMPH%: 24.5 % (ref 14.0–49.0)
MCH: 36.1 pg — ABNORMAL HIGH (ref 27.2–33.4)
MCHC: 34.8 g/dL (ref 32.0–36.0)
MCV: 103.9 fL — ABNORMAL HIGH (ref 79.3–98.0)
MONO#: 0.3 10*3/uL (ref 0.1–0.9)
MONO%: 10.5 % (ref 0.0–14.0)
NEUT%: 63.1 % (ref 39.0–75.0)
NEUTROS ABS: 1.7 10*3/uL (ref 1.5–6.5)
PLATELETS: 125 10*3/uL — AB (ref 140–400)
RBC: 3.57 10*6/uL — ABNORMAL LOW (ref 4.20–5.82)
RDW: 14.7 % — AB (ref 11.0–14.6)
WBC: 2.8 10*3/uL — ABNORMAL LOW (ref 4.0–10.3)
lymph#: 0.7 10*3/uL — ABNORMAL LOW (ref 0.9–3.3)

## 2014-05-12 LAB — COMPREHENSIVE METABOLIC PANEL (CC13)
ALBUMIN: 3.5 g/dL (ref 3.5–5.0)
ALK PHOS: 246 U/L — AB (ref 40–150)
ALT: 37 U/L (ref 0–55)
ANION GAP: 11 meq/L (ref 3–11)
AST: 66 U/L — ABNORMAL HIGH (ref 5–34)
BUN: 10.4 mg/dL (ref 7.0–26.0)
CO2: 28 meq/L (ref 22–29)
Calcium: 9.4 mg/dL (ref 8.4–10.4)
Chloride: 97 mEq/L — ABNORMAL LOW (ref 98–109)
Creatinine: 0.9 mg/dL (ref 0.7–1.3)
GLUCOSE: 305 mg/dL — AB (ref 70–140)
POTASSIUM: 4.2 meq/L (ref 3.5–5.1)
Sodium: 137 mEq/L (ref 136–145)
Total Bilirubin: 1.38 mg/dL — ABNORMAL HIGH (ref 0.20–1.20)
Total Protein: 7 g/dL (ref 6.4–8.3)

## 2014-05-12 LAB — MAGNESIUM (CC13): MAGNESIUM: 1.3 mg/dL — AB (ref 1.5–2.5)

## 2014-05-12 MED ORDER — ATROPINE SULFATE 1 MG/ML IJ SOLN
0.5000 mg | Freq: Once | INTRAMUSCULAR | Status: AC | PRN
  Administered 2014-05-12: 0.5 mg via INTRAVENOUS

## 2014-05-12 MED ORDER — DEXTROSE 5 % IV SOLN
65.0000 mg/m2 | Freq: Once | INTRAVENOUS | Status: AC
  Administered 2014-05-12: 126 mg via INTRAVENOUS
  Filled 2014-05-12: qty 6.3

## 2014-05-12 MED ORDER — ATROPINE SULFATE 1 MG/ML IJ SOLN
INTRAMUSCULAR | Status: AC
  Filled 2014-05-12: qty 1

## 2014-05-12 MED ORDER — SODIUM CHLORIDE 0.9 % IV SOLN
Freq: Once | INTRAVENOUS | Status: AC
  Administered 2014-05-12: 10:00:00 via INTRAVENOUS

## 2014-05-12 MED ORDER — DEXAMETHASONE SODIUM PHOSPHATE 10 MG/ML IJ SOLN
10.0000 mg | Freq: Once | INTRAMUSCULAR | Status: AC
  Administered 2014-05-12: 10 mg via INTRAVENOUS

## 2014-05-12 MED ORDER — HEPARIN SOD (PORK) LOCK FLUSH 100 UNIT/ML IV SOLN
500.0000 [IU] | Freq: Once | INTRAVENOUS | Status: AC | PRN
Start: 2014-05-12 — End: 2014-05-12
  Administered 2014-05-12: 500 [IU]
  Filled 2014-05-12: qty 5

## 2014-05-12 MED ORDER — SODIUM CHLORIDE 0.9 % IJ SOLN
10.0000 mL | INTRAMUSCULAR | Status: DC | PRN
  Administered 2014-05-12: 10 mL
  Filled 2014-05-12: qty 10

## 2014-05-12 MED ORDER — ONDANSETRON 8 MG/50ML IVPB (CHCC)
8.0000 mg | Freq: Once | INTRAVENOUS | Status: AC
  Administered 2014-05-12: 8 mg via INTRAVENOUS

## 2014-05-12 MED ORDER — ONDANSETRON 8 MG/NS 50 ML IVPB
INTRAVENOUS | Status: AC
  Filled 2014-05-12: qty 8

## 2014-05-12 MED ORDER — SODIUM CHLORIDE 0.9 % IJ SOLN
10.0000 mL | INTRAMUSCULAR | Status: DC | PRN
  Administered 2014-05-12: 10 mL via INTRAVENOUS
  Filled 2014-05-12: qty 10

## 2014-05-12 MED ORDER — DEXAMETHASONE SODIUM PHOSPHATE 10 MG/ML IJ SOLN
INTRAMUSCULAR | Status: AC
Start: 2014-05-12 — End: 2014-05-12
  Filled 2014-05-12: qty 1

## 2014-05-12 NOTE — Patient Instructions (Signed)
[facility] Discharge Instructions for Patients Receiving Chemotherapy  Today you received the following chemotherapy agents: Irinotecan.  To help prevent nausea and vomiting after your treatment, we encourage you to take your nausea medication: Zofran 8 mg every 8 hours as needed.   If you develop nausea and vomiting that is not controlled by your nausea medication, call the clinic.   BELOW ARE SYMPTOMS THAT SHOULD BE REPORTED IMMEDIATELY:  *FEVER GREATER THAN 100.5 F  *CHILLS WITH OR WITHOUT FEVER  NAUSEA AND VOMITING THAT IS NOT CONTROLLED WITH YOUR NAUSEA MEDICATION  *UNUSUAL SHORTNESS OF BREATH  *UNUSUAL BRUISING OR BLEEDING  TENDERNESS IN MOUTH AND THROAT WITH OR WITHOUT PRESENCE OF ULCERS  *URINARY PROBLEMS  *BOWEL PROBLEMS  UNUSUAL RASH Items with * indicate a potential emergency and should be followed up as soon as possible.  Feel free to call the clinic you have any questions or concerns. The clinic phone number is [phone].

## 2014-05-12 NOTE — Patient Instructions (Signed)

## 2014-05-19 ENCOUNTER — Other Ambulatory Visit (HOSPITAL_BASED_OUTPATIENT_CLINIC_OR_DEPARTMENT_OTHER): Payer: Medicaid Other

## 2014-05-19 DIAGNOSIS — C3410 Malignant neoplasm of upper lobe, unspecified bronchus or lung: Secondary | ICD-10-CM

## 2014-05-19 DIAGNOSIS — C787 Secondary malignant neoplasm of liver and intrahepatic bile duct: Secondary | ICD-10-CM

## 2014-05-19 DIAGNOSIS — C3490 Malignant neoplasm of unspecified part of unspecified bronchus or lung: Secondary | ICD-10-CM

## 2014-05-19 LAB — CBC WITH DIFFERENTIAL/PLATELET
BASO%: 0 % (ref 0.0–2.0)
BASOS ABS: 0 10*3/uL (ref 0.0–0.1)
EOS ABS: 0.1 10*3/uL (ref 0.0–0.5)
EOS%: 2 % (ref 0.0–7.0)
HCT: 37.3 % — ABNORMAL LOW (ref 38.4–49.9)
HEMOGLOBIN: 13.1 g/dL (ref 13.0–17.1)
LYMPH%: 28.1 % (ref 14.0–49.0)
MCH: 36.2 pg — ABNORMAL HIGH (ref 27.2–33.4)
MCHC: 35.1 g/dL (ref 32.0–36.0)
MCV: 103 fL — ABNORMAL HIGH (ref 79.3–98.0)
MONO#: 0.3 10*3/uL (ref 0.1–0.9)
MONO%: 10.8 % (ref 0.0–14.0)
NEUT%: 59.1 % (ref 39.0–75.0)
NEUTROS ABS: 1.7 10*3/uL (ref 1.5–6.5)
PLATELETS: 155 10*3/uL (ref 140–400)
RBC: 3.62 10*6/uL — AB (ref 4.20–5.82)
RDW: 14.1 % (ref 11.0–14.6)
WBC: 3 10*3/uL — AB (ref 4.0–10.3)
lymph#: 0.8 10*3/uL — ABNORMAL LOW (ref 0.9–3.3)

## 2014-05-19 LAB — COMPREHENSIVE METABOLIC PANEL (CC13)
ALBUMIN: 3.6 g/dL (ref 3.5–5.0)
ALT: 40 U/L (ref 0–55)
ANION GAP: 14 meq/L — AB (ref 3–11)
AST: 69 U/L — ABNORMAL HIGH (ref 5–34)
Alkaline Phosphatase: 237 U/L — ABNORMAL HIGH (ref 40–150)
BILIRUBIN TOTAL: 1 mg/dL (ref 0.20–1.20)
BUN: 5.1 mg/dL — AB (ref 7.0–26.0)
CO2: 26 meq/L (ref 22–29)
Calcium: 9.4 mg/dL (ref 8.4–10.4)
Chloride: 102 mEq/L (ref 98–109)
Creatinine: 0.8 mg/dL (ref 0.7–1.3)
GLUCOSE: 190 mg/dL — AB (ref 70–140)
Potassium: 3.6 mEq/L (ref 3.5–5.1)
SODIUM: 143 meq/L (ref 136–145)
Total Protein: 7.4 g/dL (ref 6.4–8.3)

## 2014-05-19 LAB — MAGNESIUM (CC13): MAGNESIUM: 1.9 mg/dL (ref 1.5–2.5)

## 2014-05-26 ENCOUNTER — Telehealth: Payer: Self-pay | Admitting: Internal Medicine

## 2014-05-26 ENCOUNTER — Other Ambulatory Visit: Payer: Medicaid Other

## 2014-05-26 NOTE — Telephone Encounter (Signed)
s.w. pt and advised on Sept 24 appt time change MD has outside appt....pt ok and aware

## 2014-06-02 ENCOUNTER — Other Ambulatory Visit: Payer: Medicaid Other

## 2014-06-02 ENCOUNTER — Ambulatory Visit: Payer: Medicaid Other | Admitting: Internal Medicine

## 2014-06-02 ENCOUNTER — Ambulatory Visit: Payer: Medicaid Other

## 2014-06-09 ENCOUNTER — Ambulatory Visit: Payer: Medicaid Other

## 2014-06-09 ENCOUNTER — Ambulatory Visit (HOSPITAL_BASED_OUTPATIENT_CLINIC_OR_DEPARTMENT_OTHER): Payer: Medicaid Other | Admitting: Nurse Practitioner

## 2014-06-09 ENCOUNTER — Encounter: Payer: Self-pay | Admitting: Nurse Practitioner

## 2014-06-09 ENCOUNTER — Other Ambulatory Visit (HOSPITAL_BASED_OUTPATIENT_CLINIC_OR_DEPARTMENT_OTHER): Payer: Medicaid Other

## 2014-06-09 VITALS — BP 138/93 | HR 106 | Temp 98.1°F | Resp 20 | Ht 72.0 in | Wt 167.6 lb

## 2014-06-09 DIAGNOSIS — S80211A Abrasion, right knee, initial encounter: Secondary | ICD-10-CM

## 2014-06-09 DIAGNOSIS — C787 Secondary malignant neoplasm of liver and intrahepatic bile duct: Secondary | ICD-10-CM

## 2014-06-09 DIAGNOSIS — C3412 Malignant neoplasm of upper lobe, left bronchus or lung: Secondary | ICD-10-CM

## 2014-06-09 DIAGNOSIS — Z720 Tobacco use: Secondary | ICD-10-CM

## 2014-06-09 DIAGNOSIS — W1839XA Other fall on same level, initial encounter: Secondary | ICD-10-CM

## 2014-06-09 DIAGNOSIS — F1011 Alcohol abuse, in remission: Secondary | ICD-10-CM

## 2014-06-09 DIAGNOSIS — W19XXXA Unspecified fall, initial encounter: Secondary | ICD-10-CM

## 2014-06-09 DIAGNOSIS — R740 Nonspecific elevation of levels of transaminase and lactic acid dehydrogenase [LDH]: Secondary | ICD-10-CM

## 2014-06-09 DIAGNOSIS — C3490 Malignant neoplasm of unspecified part of unspecified bronchus or lung: Secondary | ICD-10-CM

## 2014-06-09 DIAGNOSIS — R531 Weakness: Secondary | ICD-10-CM

## 2014-06-09 DIAGNOSIS — F1010 Alcohol abuse, uncomplicated: Secondary | ICD-10-CM

## 2014-06-09 DIAGNOSIS — Z95828 Presence of other vascular implants and grafts: Secondary | ICD-10-CM

## 2014-06-09 DIAGNOSIS — R7401 Elevation of levels of liver transaminase levels: Secondary | ICD-10-CM

## 2014-06-09 DIAGNOSIS — S0091XA Abrasion of unspecified part of head, initial encounter: Secondary | ICD-10-CM

## 2014-06-09 DIAGNOSIS — R110 Nausea: Secondary | ICD-10-CM

## 2014-06-09 DIAGNOSIS — E8809 Other disorders of plasma-protein metabolism, not elsewhere classified: Secondary | ICD-10-CM

## 2014-06-09 LAB — CBC WITH DIFFERENTIAL/PLATELET
BASO%: 0.6 % (ref 0.0–2.0)
Basophils Absolute: 0 10*3/uL (ref 0.0–0.1)
EOS%: 1.3 % (ref 0.0–7.0)
Eosinophils Absolute: 0 10*3/uL (ref 0.0–0.5)
HCT: 39.4 % (ref 38.4–49.9)
HEMOGLOBIN: 13.5 g/dL (ref 13.0–17.1)
LYMPH#: 0.7 10*3/uL — AB (ref 0.9–3.3)
LYMPH%: 18.6 % (ref 14.0–49.0)
MCH: 36.9 pg — ABNORMAL HIGH (ref 27.2–33.4)
MCHC: 34.4 g/dL (ref 32.0–36.0)
MCV: 107.4 fL — ABNORMAL HIGH (ref 79.3–98.0)
MONO#: 0.3 10*3/uL (ref 0.1–0.9)
MONO%: 8 % (ref 0.0–14.0)
NEUT#: 2.5 10*3/uL (ref 1.5–6.5)
NEUT%: 71.5 % (ref 39.0–75.0)
Platelets: 82 10*3/uL — ABNORMAL LOW (ref 140–400)
RBC: 3.66 10*6/uL — ABNORMAL LOW (ref 4.20–5.82)
RDW: 15.3 % — AB (ref 11.0–14.6)
WBC: 3.5 10*3/uL — ABNORMAL LOW (ref 4.0–10.3)

## 2014-06-09 LAB — COMPREHENSIVE METABOLIC PANEL (CC13)
ALBUMIN: 3.1 g/dL — AB (ref 3.5–5.0)
ALT: 88 U/L — AB (ref 0–55)
AST: 221 U/L — AB (ref 5–34)
Alkaline Phosphatase: 362 U/L — ABNORMAL HIGH (ref 40–150)
Anion Gap: 10 mEq/L (ref 3–11)
BUN: 8 mg/dL (ref 7.0–26.0)
CALCIUM: 9.4 mg/dL (ref 8.4–10.4)
CHLORIDE: 98 meq/L (ref 98–109)
CO2: 30 mEq/L — ABNORMAL HIGH (ref 22–29)
Creatinine: 0.8 mg/dL (ref 0.7–1.3)
Glucose: 275 mg/dl — ABNORMAL HIGH (ref 70–140)
POTASSIUM: 4 meq/L (ref 3.5–5.1)
Sodium: 138 mEq/L (ref 136–145)
Total Bilirubin: 2.4 mg/dL — ABNORMAL HIGH (ref 0.20–1.20)
Total Protein: 6.8 g/dL (ref 6.4–8.3)

## 2014-06-09 LAB — MAGNESIUM (CC13): Magnesium: 1.8 mg/dl (ref 1.5–2.5)

## 2014-06-09 MED ORDER — SODIUM CHLORIDE 0.9 % IJ SOLN
10.0000 mL | INTRAMUSCULAR | Status: DC | PRN
  Administered 2014-06-09: 10 mL via INTRAVENOUS
  Filled 2014-06-09: qty 10

## 2014-06-09 MED ORDER — TEMOZOLOMIDE 140 MG PO CAPS: 150.0000 mg/m2/d | ORAL_CAPSULE | Freq: Every day | ORAL | Status: DC

## 2014-06-09 MED ORDER — HEPARIN SOD (PORK) LOCK FLUSH 100 UNIT/ML IV SOLN
500.0000 [IU] | Freq: Once | INTRAVENOUS | Status: AC
  Administered 2014-06-09: 500 [IU] via INTRAVENOUS
  Filled 2014-06-09: qty 5

## 2014-06-09 NOTE — Assessment & Plan Note (Signed)
Patient also complains of some intermittent nausea.  He states he has anti nausea medications at home to take on an as-needed basis.  Since patient is preparing to initiate Temodar oral chemotherapy-advised patient to take his antinausea medication approximate 30 minutes prior to the Temodar.

## 2014-06-09 NOTE — Assessment & Plan Note (Signed)
Patient continues to smoke approximately half a pack per day.  He was encouraged to discontinue smoking if at all possible.

## 2014-06-09 NOTE — Assessment & Plan Note (Signed)
Patient's albumin remains low.  Patient was encouraged to push protein in his diet is much as possible.

## 2014-06-09 NOTE — Assessment & Plan Note (Signed)
AST has increased from 69 to a critical level of 221.  Patient states that he has increased his alcohol intake within this past week.  Patient was encouraged to limit his alcohol is much as possible.

## 2014-06-09 NOTE — Progress Notes (Signed)
Lab called regarding panic value AST 221. Lab value passed along to Selena Lesser, NP who is seeing patient today.   Temodar resent to [place] per patient request. Dr. Earlie Server had sent to [place]. [place] to cancel Temodar script since sent to [facility] instead.

## 2014-06-09 NOTE — Assessment & Plan Note (Signed)
Patient has been undergoing Irinotecan chemotherapy regimen this summer; but has  missed his last few scheduled cycles of chemotherapy due to vacation and transportation issues.  Due to pancytopenia with a platelet count of 82 and elevated liver enzymes-will discontinue Irinotecan chemotherapy.  After discussion of options-the patient has made a decision to initiate oral Temodar.  Will order Temodar 150 mg per meter squared.  The patient will take the Temodar on 5 days in a row out of an every four-week period.  Patient has plans to return approximate 2 weeks on 06/23/2014 for labs and a follow up visit.

## 2014-06-09 NOTE — Progress Notes (Signed)
[place]   Chief Complaint  Patient presents with  . Follow-up    HPI: William Townsend 53 y.o. male diagnosed with lung cancer.  Patient currently undergoing Irinotecan chemotherapy.  Patient returns to the [facility] today for followup and to receive cycle 5 of his Irinotecan chemotherapy regimen.  He has actually missed his last 2 cycles of chemotherapy to 2 extended vacation and transportation issues.  Patient is complaining of some of worsening fatigue and generalized weakness; stating that he did fall once over this past weekend when he tripped over a cat toy.  He states he has plans to start using his cane for ambulation.  He does admit to drinking approximately 4 vodka mixed drinks on a daily basis; and states that he has increased his drinking of this past week or so.  He is unclear if his increased alcohol intake had anything to do with this fall.  He also complains of some intermittent nausea.  He continues to smoke approximate half a pack per day.  He denies any recent fevers or chills.  HPI  CURRENT THERAPY: Upcoming Treatment Dates - LUNG Irinotecan D1,8 q21d Days with orders from any treatment category:  06/09/2014      SCHEDULING COMMUNICATION      ondansetron (ZOFRAN) IVPB 8 mg      dexamethasone (DECADRON) injection 10 mg      irinotecan (CAMPTOSAR) 126 mg in dextrose 5 % 500 mL chemo infusion      atropine injection 0.5 mg      sodium chloride 0.9 % injection 10 mL      heparin lock flush 100 unit/mL      heparin lock flush 100 unit/mL      alteplase (CATHFLO ACTIVASE) injection 2 mg      sodium chloride 0.9 % injection 3 mL      Cold Pack 1 packet      0.9 %  sodium chloride infusion      TREATMENT CONDITIONS 06/16/2014      SCHEDULING COMMUNICATION      ondansetron (ZOFRAN) IVPB 8 mg      dexamethasone (DECADRON) injection 10 mg      irinotecan (CAMPTOSAR) 126 mg in dextrose 5 % 500 mL chemo infusion      atropine injection 0.5 mg    sodium chloride 0.9 % injection 10 mL      heparin lock flush 100 unit/mL      heparin lock flush 100 unit/mL      alteplase (CATHFLO ACTIVASE) injection 2 mg      sodium chloride 0.9 % injection 3 mL      Cold Pack 1 packet      0.9 %  sodium chloride infusion      TREATMENT CONDITIONS 06/30/2014      SCHEDULING COMMUNICATION      ondansetron (ZOFRAN) IVPB 8 mg      dexamethasone (DECADRON) injection 10 mg      irinotecan (CAMPTOSAR) 126 mg in dextrose 5 % 500 mL chemo infusion      atropine injection 0.5 mg      sodium chloride 0.9 % injection 10 mL      heparin lock flush 100 unit/mL      heparin lock flush 100 unit/mL      alteplase (CATHFLO ACTIVASE) injection 2 mg      sodium chloride 0.9 % injection 3 mL      Cold Pack 1 packet  0.9 %  sodium chloride infusion      TREATMENT CONDITIONS    ROS  Past Medical History  Diagnosis Date  . Atrial fibrillation   . Diabetes mellitus   . 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 Allergy   . Substance abuse   . Depression   . Stroke     3 previous strokes - Oct, Nov, and Dec 2012  . Throat cancer     September 2008  . Lung cancer     lobectomy in 2010  . Heart murmur   . Hx of radiation therapy 05/18/13- 06/01/13    whole brain, 30 Gy, 10 fractions    Past Surgical History  Procedure Laterality Date  . Lobectomy    . Craniotomy Left 04/27/2013    Procedure: Left Occipital Craniotomy for resection of tumor with microscope;  Surgeon: Floyce Stakes, MD;  Location: [facility];  Service: Neurosurgery;  Laterality: Left;    has Diabetes mellitus, type 2; Essential hypertension, benign; History of stroke; History of alcohol abuse; History of lung cancer; History of throat cancer; Allergic rhinitis; Chronic pain of multiple joints; Headache(784.0); Tobacco abuse; Brain mass; Lesion of lung; Liver lesion; Small cell lung cancer; Protein-calorie malnutrition, severe; Acute encephalopathy; Localization-related (focal) (partial)  epilepsy and epileptic syndromes with complex partial seizures, without mention of intractable epilepsy; Weakness; Ataxia; Palliative care encounter; HCAP (healthcare-associated pneumonia); Insomnia; Seizures; Abnormality of gait; Back pain; Fall; Transaminitis; Nausea without vomiting; and Hypoalbuminemia on his problem list.     is allergic to vicodin.    Medication List       This list is accurate as of: 06/09/14 11:39 AM.  Always use your most recent med list.               ACCU-CHEK NANO SMARTVIEW W/DEVICE Kit  Measure blood sugar once daily prior to taking amaryl in the morning.     accu-chek softclix lancets  Use as instructed     bisacodyl 5 MG EC tablet  Commonly known as:  DULCOLAX  Take 5 mg by mouth daily.     diclofenac sodium 1 % Gel  Commonly known as:  VOLTAREN  Apply 2 g topically 4 (four) times daily.     feeding supplement (ENSURE COMPLETE) Liqd  Take 237 mLs by mouth 2 (two) times daily between meals.     folic acid 1 MG tablet  Commonly known as:  FOLVITE  TAKE 1 TABLET BY MOUTH ONCE DAILY     gabapentin 100 MG capsule  Commonly known as:  NEURONTIN  Take 1 capsule (100 mg total) by mouth 3 (three) times daily.     glimepiride 2 MG tablet  Commonly known as:  AMARYL  Take 1 tablet (2 mg total) by mouth daily with breakfast.     glucose blood test strip  Commonly known as:  ACCU-CHEK ACTIVE STRIPS  Use as instructed     guaiFENesin 600 MG 12 hr tablet  Commonly known as:  MUCINEX  Take 1 tablet (600 mg total) by mouth 2 (two) times daily.     levETIRAcetam 500 MG tablet  Commonly known as:  KEPPRA  Take 500 mg by mouth 2 (two) times daily. One half tablet twice a day for 2 weeks, then take one tablet twice a day     loratadine 10 MG tablet  Commonly known as:  CLARITIN  Take 1 tablet (10 mg total) by mouth daily as needed for allergies.  magnesium oxide 400 (241.3 MG) MG tablet  Commonly known as:  MAG-OX  TAKE 1 TABLET BY MOUTH THREE  TIMES DAILY     nortriptyline 25 MG capsule  Commonly known as:  PAMELOR  Take 1 capsule (25 mg total) by mouth at bedtime.     omeprazole 20 MG capsule  Commonly known as:  PRILOSEC  Take 1 capsule (20 mg total) by mouth daily.     ondansetron 8 MG tablet  Commonly known as:  ZOFRAN  Take 1 tablet (8 mg total) by mouth every 8 (eight) hours as needed for nausea or vomiting (start on the 3rd day after chemotherapy.).     polyethylene glycol packet  Commonly known as:  MIRALAX / GLYCOLAX  Take 17 g by mouth daily.     temozolomide 140 MG capsule  Commonly known as:  TEMODAR  Take 2 capsules (280 mg total) by mouth daily. May take on an empty stomach or at bedtime to decrease nausea & vomiting.     thiamine 100 MG tablet  Take 1 tablet (100 mg total) by mouth daily.         PHYSICAL EXAMINATION  Blood pressure 138/93, pulse 106, temperature 98.1 F (36.7 C), temperature source Oral, resp. rate 20, height 6' (1.829 m), weight 167 lb 9.6 oz (76.023 kg), SpO2 98.00%.  Physical Exam  Nursing note and vitals reviewed. Constitutional: He is oriented to person, place, and time. Vital signs are normal. He appears malnourished. He appears unhealthy. No distress.  HENT:  Head: Normocephalic and atraumatic.  Mouth/Throat: Oropharynx is clear and moist. No oropharyngeal exudate.  Patient partially edentulous; with only 3 remaining teeth.  Eyes: Conjunctivae and EOM are normal. Pupils are equal, round, and reactive to light. Right eye exhibits no discharge. Left eye exhibits no discharge. No scleral icterus.  Neck: Normal range of motion. Neck supple. No JVD present. No thyromegaly present.  Cardiovascular: Normal rate, regular rhythm, normal heart sounds and intact distal pulses.  Exam reveals no friction rub.   No murmur heard. Pulmonary/Chest: Effort normal and breath sounds normal. No respiratory distress. He has no wheezes.  Abdominal: Soft. Bowel sounds are normal. He exhibits no  distension. There is no tenderness. There is no rebound.  Musculoskeletal: Normal range of motion. He exhibits no edema and no tenderness.  Lymphadenopathy:    He has no cervical adenopathy.  Neurological: He is alert and oriented to person, place, and time. Gait normal.  Skin: Skin is warm and dry. No rash noted. No erythema.  Patient has healing abrasion to right for head and right knee.  No evidence of infection.  Psychiatric: Affect normal.    LABORATORY DATA:. CBC  Lab Results  Component Value Date   WBC 3.5* 06/09/2014   RBC 3.66* 06/09/2014   HGB 13.5 06/09/2014   HCT 39.4 06/09/2014   PLT 82* 06/09/2014   MCV 107.4* 06/09/2014   MCH 36.9* 06/09/2014   MCHC 34.4 06/09/2014   RDW 15.3* 06/09/2014   LYMPHSABS 0.7* 06/09/2014   MONOABS 0.3 06/09/2014   EOSABS 0.0 06/09/2014   BASOSABS 0.0 06/09/2014     CMET  Lab Results  Component Value Date   NA 138 06/09/2014   K 4.0 06/09/2014   CL 101 02/11/2014   CO2 30* 06/09/2014   GLUCOSE 275* 06/09/2014   BUN 8.0 06/09/2014   CREATININE 0.8 06/09/2014   CALCIUM 9.4 06/09/2014   PROT 6.8 06/09/2014   ALBUMIN 3.1* 06/09/2014   AST 221*  06/09/2014   ALT 88* 06/09/2014   ALKPHOS 362* 06/09/2014   BILITOT 2.40* 06/09/2014   GFRNONAA >90 02/11/2014   GFRAA >90 02/11/2014    ASSESSMENT/PLAN:    History of alcohol abuse  Assessment & Plan Patient has a history of chronic alcohol abuse in the past.  He states that he typically continues to drink approximately 4 mixed drinks per day.  He states within this past week or so he has increased his drinking a bit.  Carefully reviewed patient's elevated liver enzyme results with patient today; and strongly encouraged him to decrease his alcohol intake is much as possible.   Tobacco abuse  Assessment & Plan Patient continues to smoke approximately half a pack per day.  He was encouraged to discontinue smoking if at all possible.   Small cell lung cancer  Assessment & Plan Patient has been undergoing  Irinotecan chemotherapy regimen this summer; but has  missed his last few scheduled cycles of chemotherapy due to vacation and transportation issues.  Due to pancytopenia with a platelet count of 82 and elevated liver enzymes-will discontinue Irinotecan chemotherapy.  After discussion of options-the patient has made a decision to initiate oral Temodar.  Will order Temodar 150 mg per meter squared.  The patient will take the Temodar on 5 days in a row out of an every four-week period.  Patient has plans to return approximate 2 weeks on 06/23/2014 for labs and a follow up visit.   Weakness  Assessment & Plan Patient is complaining of some mild increase in his generalized weakness; and feels that this is the reason he may have fallen.  He patient also admits to drinking heavier this past week as well.  Patient was encouraged to use his cane when ambulating.   Fall  Assessment & Plan Patient did experience a fall this past weekend; with injuries which include a right for head abrasion and a mild right knee abrasion.  Patient denies any loss of consciousness with fall.  He states that he tripped over his cat's toys.  Patient was encouraged to use his cane when ambulating for safety.   Transaminitis  Assessment & Plan AST has increased from 69 to a critical level of 221.  Patient states that he has increased his alcohol intake within this past week.  Patient was encouraged to limit his alcohol is much as possible.   Nausea without vomiting  Assessment & Plan Patient also complains of some intermittent nausea.  He states he has anti nausea medications at home to take on an as-needed basis.  Since patient is preparing to initiate Temodar oral chemotherapy-advised patient to take his antinausea medication approximate 30 minutes prior to the Temodar.   Hypoalbuminemia  Assessment & Plan Patient's albumin remains low.  Patient was encouraged to push protein in his diet is much as  possible.   Patient stated understanding of all instructions; and was in agreement with this plan of care. The patient knows to call the clinic with any problems, questions or concerns.   This was a shared visit with Dr. Julien Nordmann today..   Total time spent with patient was  25  minutes;  with greater than  75  percent of that time spent in face to face counseling regarding  his symptoms, review of treatment options,  and coordination of care and follow up.  Disclaimer: This note was dictated with voice recognition software. Similar sounding words can inadvertently be transcribed and may not be corrected upon review.  Drue Second, NP 06/09/2014   ADDENDUM: Hematology/Oncology Attending: I had a face to face encounter with the patient. I recommended his care plan. This is a very pleasant 53 years old white male with extensive stage small cell lung cancer who was recently treated with chemotherapy in the form of single agent irinotecan on days 1 and 8 every 3 weeks. He missed the last 2 treatment because of extended vacation and transportation issues. He came today for evaluation and to resume his treatment.  He was noted to have elevated liver enzymes. I have a lengthy discussion with the patient today about his current condition and treatment options. I do think the patient would be a good candidate to continue his current treatment with irinotecan at this point because of the liver toxicity. I also strongly encouraged the patient to decrease the amount of alcohol he drinks and to quit if possible. I discussed with him other treatment options for his condition and recommended for him treatment with Temodar 150 mg/M2 for 5 days every 4 weeks. I discussed with the patient adverse effect of this treatment including but not limited to nausea and vomiting, hair loss, myelosuppression.  I will send this prescription to [facility] outpatient pharmacy. He would come back for followup visit in 2  weeks for reevaluation and repeat blood work. He was advised to call immediately if he has any concerning symptoms in the interval.  Disclaimer: This note was dictated with voice recognition software. Similar sounding words can inadvertently be transcribed and may be missed upon review. Eilleen Kempf., MD 06/10/2014

## 2014-06-09 NOTE — Patient Instructions (Signed)

## 2014-06-09 NOTE — Assessment & Plan Note (Signed)
Patient is complaining of some mild increase in his generalized weakness; and feels that this is the reason he may have fallen.  He patient also admits to drinking heavier this past week as well.  Patient was encouraged to use his cane when ambulating.

## 2014-06-09 NOTE — Assessment & Plan Note (Signed)
Patient did experience a fall this past weekend; with injuries which include a right for head abrasion and a mild right knee abrasion.  Patient denies any loss of consciousness with fall.  He states that he tripped over his cat's toys.  Patient was encouraged to use his cane when ambulating for safety.

## 2014-06-09 NOTE — Assessment & Plan Note (Signed)
Patient has a history of chronic alcohol abuse in the past.  He states that he typically continues to drink approximately 4 mixed drinks per day.  He states within this past week or so he has increased his drinking a bit.  Carefully reviewed patient's elevated liver enzyme results with patient today; and strongly encouraged him to decrease his alcohol intake is much as possible.

## 2014-06-16 ENCOUNTER — Other Ambulatory Visit: Payer: Medicaid Other

## 2014-06-23 ENCOUNTER — Ambulatory Visit (HOSPITAL_BASED_OUTPATIENT_CLINIC_OR_DEPARTMENT_OTHER): Payer: Medicaid Other

## 2014-06-23 ENCOUNTER — Telehealth: Payer: Self-pay | Admitting: Internal Medicine

## 2014-06-23 ENCOUNTER — Other Ambulatory Visit (HOSPITAL_BASED_OUTPATIENT_CLINIC_OR_DEPARTMENT_OTHER): Payer: Medicaid Other

## 2014-06-23 ENCOUNTER — Other Ambulatory Visit: Payer: Medicaid Other

## 2014-06-23 ENCOUNTER — Ambulatory Visit (HOSPITAL_BASED_OUTPATIENT_CLINIC_OR_DEPARTMENT_OTHER): Payer: Medicaid Other | Admitting: Physician Assistant

## 2014-06-23 ENCOUNTER — Encounter: Payer: Self-pay | Admitting: Physician Assistant

## 2014-06-23 VITALS — BP 139/82 | HR 105 | Temp 98.5°F | Resp 18 | Ht 72.0 in | Wt 167.7 lb

## 2014-06-23 DIAGNOSIS — C3490 Malignant neoplasm of unspecified part of unspecified bronchus or lung: Secondary | ICD-10-CM

## 2014-06-23 DIAGNOSIS — C3412 Malignant neoplasm of upper lobe, left bronchus or lung: Secondary | ICD-10-CM

## 2014-06-23 DIAGNOSIS — Z95828 Presence of other vascular implants and grafts: Secondary | ICD-10-CM

## 2014-06-23 DIAGNOSIS — C787 Secondary malignant neoplasm of liver and intrahepatic bile duct: Secondary | ICD-10-CM

## 2014-06-23 DIAGNOSIS — Z452 Encounter for adjustment and management of vascular access device: Secondary | ICD-10-CM

## 2014-06-23 DIAGNOSIS — G4089 Other seizures: Secondary | ICD-10-CM

## 2014-06-23 LAB — COMPREHENSIVE METABOLIC PANEL (CC13)
ALT: 62 U/L — ABNORMAL HIGH (ref 0–55)
AST: 151 U/L — ABNORMAL HIGH (ref 5–34)
Albumin: 3.2 g/dL — ABNORMAL LOW (ref 3.5–5.0)
Alkaline Phosphatase: 464 U/L — ABNORMAL HIGH (ref 40–150)
Anion Gap: 13 mEq/L — ABNORMAL HIGH (ref 3–11)
BUN: 4.7 mg/dL — AB (ref 7.0–26.0)
CO2: 27 mEq/L (ref 22–29)
Calcium: 9.7 mg/dL (ref 8.4–10.4)
Chloride: 100 mEq/L (ref 98–109)
Creatinine: 0.7 mg/dL (ref 0.7–1.3)
GLUCOSE: 134 mg/dL (ref 70–140)
Potassium: 4 mEq/L (ref 3.5–5.1)
Sodium: 140 mEq/L (ref 136–145)
Total Bilirubin: 2.36 mg/dL — ABNORMAL HIGH (ref 0.20–1.20)
Total Protein: 7.1 g/dL (ref 6.4–8.3)

## 2014-06-23 LAB — CBC WITH DIFFERENTIAL/PLATELET
BASO%: 0.4 % (ref 0.0–2.0)
BASOS ABS: 0 10*3/uL (ref 0.0–0.1)
EOS ABS: 0.1 10*3/uL (ref 0.0–0.5)
EOS%: 1.5 % (ref 0.0–7.0)
HCT: 41.5 % (ref 38.4–49.9)
HEMOGLOBIN: 14.2 g/dL (ref 13.0–17.1)
LYMPH%: 14.7 % (ref 14.0–49.0)
MCH: 37.3 pg — ABNORMAL HIGH (ref 27.2–33.4)
MCHC: 34.1 g/dL (ref 32.0–36.0)
MCV: 109.3 fL — AB (ref 79.3–98.0)
MONO#: 0.6 10*3/uL (ref 0.1–0.9)
MONO%: 16.8 % — ABNORMAL HIGH (ref 0.0–14.0)
NEUT%: 66.6 % (ref 39.0–75.0)
NEUTROS ABS: 2.4 10*3/uL (ref 1.5–6.5)
Platelets: 163 10*3/uL (ref 140–400)
RBC: 3.79 10*6/uL — ABNORMAL LOW (ref 4.20–5.82)
RDW: 14.6 % (ref 11.0–14.6)
WBC: 3.6 10*3/uL — ABNORMAL LOW (ref 4.0–10.3)
lymph#: 0.5 10*3/uL — ABNORMAL LOW (ref 0.9–3.3)

## 2014-06-23 LAB — MAGNESIUM (CC13): Magnesium: 1.8 mg/dl (ref 1.5–2.5)

## 2014-06-23 MED ORDER — SODIUM CHLORIDE 0.9 % IJ SOLN
10.0000 mL | INTRAMUSCULAR | Status: DC | PRN
  Administered 2014-06-23: 10 mL via INTRAVENOUS
  Filled 2014-06-23: qty 10

## 2014-06-23 MED ORDER — SULFAMETHOXAZOLE-TMP DS 800-160 MG PO TABS: ORAL_TABLET | ORAL | Status: DC

## 2014-06-23 MED ORDER — SULFAMETHOXAZOLE-TRIMETHOPRIM 800-160 MG PO TABS: ORAL_TABLET | ORAL | Status: DC

## 2014-06-23 MED ORDER — HEPARIN SOD (PORK) LOCK FLUSH 100 UNIT/ML IV SOLN
500.0000 [IU] | Freq: Once | INTRAVENOUS | Status: AC
  Administered 2014-06-23: 500 [IU] via INTRAVENOUS
  Filled 2014-06-23: qty 5

## 2014-06-23 NOTE — Telephone Encounter (Signed)
gv and printed appt sched and avs fo rpt for OCT

## 2014-06-23 NOTE — Patient Instructions (Signed)

## 2014-06-23 NOTE — Progress Notes (Addendum)
[place]  [phone] Fax:(336) [phone]  PROGRESS NOTE  William Putnam, DO [place] [address]  DIAGNOSIS: Extensive Stage small cell lung cancer diagnosed in September of 2014.  PRIOR THERAPY:  1) Systemic chemotherapy with carboplatin for an AUC of 5 given on day 1 and etoposide 120 mg per meter squared given on days 1, 2 and 3 with Neulasta support given on day 4 status post 2 cycles. Carboplatin was reduced to AUC of 4 and etoposide 100 mg/M2 starting from cycle #6, last dose was 09/20/2013. 2) Systemic chemotherapy with cisplatin 30 mg/M2 and irinotecan 65 mg/M2 on days 1 and 8 every 3 weeks first dose today 01/06/2014. Cisplatin was discontinued after cycle #1 secondary to hypersensitivity reaction.  3) Systemic chemotherapy with single agent irinotecan 65 mg/M2 on days 1 and 8 every 3 weeks. First dose 02/18/2014. Status post 4 cycles.  CURRENT THERAPY: Temodar 280 mg by mouth daily for 5 days every 4 weeks. First dose given 06/10/2014. Status post 1 cycle.  DISEASE STAGE: Extensive stage  CHEMOTHERAPY INTENT: Palliative  CURRENT # OF CHEMOTHERAPY CYCLES: 1 CURRENT ANTIEMETICS: Zofran, dexamethasone, Compazine  CURRENT SMOKING STATUS: Current smoker, strongly advised to quit smoking  ORAL CHEMOTHERAPY AND CONSENT: N./A.  CURRENT BISPHOSPHONATES USE: None  PAIN MANAGEMENT: Percocet  NARCOTICS INDUCED CONSTIPATION: None  LIVING WILL AND CODE STATUS: ?  INTERVAL HISTORY: William Townsend 53 y.o. male returns for a follow up visit. He tolerated the Temodar relatively well the exception of some malaise and nausea. The nausea is well managed with his current antiemetic. He had some constipation initially out while on the Temodar but his bowel movements as normalized. He did have one episode of diarrhea this morning not containing any blood. He continues to drink but states it is not drinking daily. His consumption has  decreased from 12 ounces of vodka 28 ounces of vodka. Clarification, he continues to drink 6 days out of 7. He has  not completely quit alcohol. Patient states she's been drinking for over 30 years. He denied having any significant chest pain, shortness of breath, cough or hemoptysis. The patient has no current nausea or vomiting. He has no fever or chills. He has no significant weight loss or night sweats.   MEDICAL HISTORY: Past Medical History  Diagnosis Date  . Atrial fibrillation   . Diabetes mellitus   . 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 Allergy   . Substance abuse   . Depression   . Stroke     3 previous strokes - Oct, Nov, and Dec 2012  . Throat cancer     September 2008  . Lung cancer     lobectomy in 2010  . Heart murmur   . Hx of radiation therapy 05/18/13- 06/01/13    whole brain, 30 Gy, 10 fractions    ALLERGIES:  is allergic to vicodin.  MEDICATIONS:  Current Outpatient Prescriptions  Medication Sig Dispense Refill  . bisacodyl (DULCOLAX) 5 MG EC tablet Take 5 mg by mouth daily.      . Blood Glucose Monitoring Suppl (ACCU-CHEK NANO SMARTVIEW) W/DEVICE KIT Measure blood sugar once daily prior to taking amaryl in the morning.  1 kit  0  . feeding supplement, ENSURE COMPLETE, (ENSURE COMPLETE) LIQD Take 237 mLs by mouth 2 (two) times daily between meals.  30 Bottle  0  . folic acid (FOLVITE) 1 MG tablet TAKE 1 TABLET BY MOUTH ONCE DAILY  30 tablet  2  . gabapentin (NEURONTIN) 100 MG capsule Take 1 capsule (100 mg total) by mouth 3 (three) times daily.  90 capsule  2  . glimepiride (AMARYL) 2 MG tablet Take 1 tablet (2 mg total) by mouth daily with breakfast.  30 tablet  2  . glucose blood (ACCU-CHEK ACTIVE STRIPS) test strip Use as instructed  100 each  12  . guaiFENesin (MUCINEX) 600 MG 12 hr tablet Take 1 tablet (600 mg total) by mouth 2 (two) times daily.  30 tablet  0  . Lancet Devices (ACCU-CHEK SOFTCLIX) lancets Use as instructed  1 each  3  . levETIRAcetam (KEPPRA)  500 MG tablet Take 500 mg by mouth 2 (two) times daily. One half tablet twice a day for 2 weeks, then take one tablet twice a day      . loratadine (CLARITIN) 10 MG tablet Take 1 tablet (10 mg total) by mouth daily as needed for allergies.  30 tablet  0  . magnesium oxide (MAG-OX) 400 (241.3 MG) MG tablet TAKE 1 TABLET BY MOUTH THREE TIMES DAILY      . omeprazole (PRILOSEC) 20 MG capsule Take 1 capsule (20 mg total) by mouth daily.  30 capsule  0  . ondansetron (ZOFRAN) 8 MG tablet Take 1 tablet (8 mg total) by mouth every 8 (eight) hours as needed for nausea or vomiting (start on the 3rd day after chemotherapy.).  30 tablet  1  . temozolomide (TEMODAR) 140 MG capsule Take 2 capsules (280 mg total) by mouth daily. May take on an empty stomach or at bedtime to decrease nausea & vomiting.  10 capsule  2  . thiamine 100 MG tablet Take 1 tablet (100 mg total) by mouth daily.  30 tablet  2  . diclofenac sodium (VOLTAREN) 1 % GEL Apply 2 g topically 4 (four) times daily.  100 g  1  . nortriptyline (PAMELOR) 25 MG capsule Take 1 capsule (25 mg total) by mouth at bedtime.  30 capsule  1  . polyethylene glycol (MIRALAX / GLYCOLAX) packet Take 17 g by mouth daily.      Marland Kitchen sulfamethoxazole-trimethoprim (BACTRIM DS,SEPTRA DS) 800-160 MG per tablet Take 1 tablet by mouth twice a day on Mondays and Thursdays  30 tablet  1   No current facility-administered medications for this visit.   Facility-Administered Medications Ordered in Other Visits  Medication Dose Route Frequency Provider Last Rate Last Dose  . heparin lock flush 100 unit/mL  500 Units Intravenous Once Si Gaul, MD      . sodium chloride 0.9 % injection 10 mL  10 mL Intravenous PRN Si Gaul, MD   10 mL at 01/06/14 0830    SURGICAL HISTORY:  Past Surgical History  Procedure Laterality Date  . Lobectomy    . Craniotomy Left 04/27/2013    Procedure: Left Occipital Craniotomy for resection of tumor with microscope;  Surgeon: Karn Cassis, MD;  Location: [facility];  Service: Neurosurgery;  Laterality: Left;    REVIEW OF SYSTEMS:  Constitutional: positive for fatigue Eyes: negative Ears, nose, mouth, throat, and face: negative Respiratory: negative Cardiovascular: negative Gastrointestinal: negative Genitourinary:negative Integument/breast: negative Hematologic/lymphatic: negative Musculoskeletal:negative Neurological: negative Behavioral/Psych: negative Endocrine: negative Allergic/Immunologic: negative   PHYSICAL EXAMINATION: General appearance: alert, cooperative and no distress Head: Normocephalic, without obvious abnormality, atraumatic Neck: no adenopathy, no carotid bruit, no JVD, supple, symmetrical, trachea midline and thyroid not enlarged, symmetric, no tenderness/mass/nodules Lymph nodes: Cervical, supraclavicular, and axillary nodes normal. Resp:  clear to auscultation bilaterally Cardio: regular rate and rhythm, S1, S2 normal, no murmur, click, rub or gallop GI: soft, non-tender; bowel sounds normal; no masses,  no organomegaly Extremities: extremities normal, atraumatic, no cyanosis or edema Neurologic: Alert and oriented X 3, normal strength and tone. Normal symmetric reflexes. Normal coordination and gait  ECOG PERFORMANCE STATUS: 1 - Symptomatic but completely ambulatory  Blood pressure 139/82, pulse 105, temperature 98.5 F (36.9 C), temperature source Oral, resp. rate 18, height 6' (1.829 m), weight 167 lb 11.2 oz (76.068 kg), SpO2 95.00%.  LABORATORY DATA: Lab Results  Component Value Date   WBC 3.6* 06/23/2014   HGB 14.2 06/23/2014   HCT 41.5 06/23/2014   MCV 109.3* 06/23/2014   PLT 163 06/23/2014      Chemistry      Component Value Date/Time   NA 140 06/23/2014 0840   NA 138 02/11/2014 0429   K 4.0 06/23/2014 0840   K 3.5* 02/11/2014 0429   CL 101 02/11/2014 0429   CO2 27 06/23/2014 0840   CO2 26 02/11/2014 0429   BUN 4.7* 06/23/2014 0840   BUN 5* 02/11/2014 0429   CREATININE  0.7 06/23/2014 0840   CREATININE 0.74 02/11/2014 0429   CREATININE 1.04 01/28/2012 1454      Component Value Date/Time   CALCIUM 9.7 06/23/2014 0840   CALCIUM 8.4 02/11/2014 0429   ALKPHOS 464* 06/23/2014 0840   ALKPHOS 130* 02/10/2014 0302   AST 151* 06/23/2014 0840   AST 49* 02/10/2014 0302   ALT 62* 06/23/2014 0840   ALT 21 02/10/2014 0302   BILITOT 2.36* 06/23/2014 0840   BILITOT 1.5* 02/10/2014 0302       RADIOGRAPHIC STUDIES:  ASSESSMENT/PLAN:  This is a very pleasant 53 years old white male with extensive stage small cell lung cancer status post 6 cycles of systemic chemotherapy with carboplatin and etoposide. He was then tried on treatment with cisplatin and irinotecan but cisplatin was discontinued secondary to hypersensitivity reaction and the patient is currently continuing irinotecan as a single agent status post 4 cycles. This was discontinued secondary elevated liver enzymes as well as issues with transportation. Overall he tolerated his first cycle of Temodar relatively well. Patient was discussed with also seen by Dr. Julien Nordmann. A prescription for Bactrim DS one tablet by mouth twice daily on Mondays and Thursdays was sent to his pharmacy of record via E. scribe, a total of 30 tablets with 1 refill. He'll followup in 2 weeks for another symptom management visit.   He was again strongly encouraged to quit alcohol drinking.  For the history of seizure activity, the patient will continue his current treatment with Keppra. He was advised to call immediately if he has any concerning symptoms in the interval. All questions were answered. The patient knows to call the clinic with any problems, questions or concerns. We can certainly see the patient much sooner if necessary.   Disclaimer: This note was dictated with voice recognition software. Similar sounding words can inadvertently be transcribed and may not be corrected upon review.  Carlton Adam,  PA-C 06/23/2014  ADDENDUM: Hematology/Oncology Attending: I had a face to face encounter with the patient. I recommended his care plan. This is a very pleasant 53 years old white male with extensive stage small cell lung cancer diagnosed in September of 2014 status post several chemotherapy regimens including carboplatin and etoposide as well as cisplatin and irinotecan followed by single agent irinotecan after the patient developed hypersensitivity reaction to cisplatin.  This was discontinued secondary to disease progression as well as intolerance. The patient is currently on treatment with oral Temodar 150 mg/M2 for 5 days every 4 weeks status post 1 cycle. He tolerated the first cycle of his treatment fairly well except for mild nausea. His LFTs are slightly elevated today and I strongly encouraged the patient to quit alcohol drinking. I will continue to monitor his liver enzymes closely. The patient was started on PCP prophylaxis with Bactrim DS. He would come back for followup visit in 2 weeks for reevaluation and management any adverse effect of his treatment. He was advised to call immediately if he has any concerning symptoms in the interval.  Disclaimer: This note was dictated with voice recognition software. Similar sounding words can inadvertently be transcribed and may be missed upon review. Eilleen Kempf., MD 06/26/2014

## 2014-06-25 NOTE — Patient Instructions (Signed)
Discontinue drinking as advised Follow up in 2 weeks

## 2014-06-30 ENCOUNTER — Ambulatory Visit: Payer: Medicaid Other

## 2014-06-30 ENCOUNTER — Other Ambulatory Visit: Payer: Medicaid Other

## 2014-07-07 ENCOUNTER — Encounter: Payer: Self-pay | Admitting: Physician Assistant

## 2014-07-07 ENCOUNTER — Ambulatory Visit: Payer: Medicaid Other

## 2014-07-07 ENCOUNTER — Telehealth: Payer: Self-pay | Admitting: Physician Assistant

## 2014-07-07 ENCOUNTER — Other Ambulatory Visit (HOSPITAL_BASED_OUTPATIENT_CLINIC_OR_DEPARTMENT_OTHER): Payer: Medicaid Other

## 2014-07-07 ENCOUNTER — Other Ambulatory Visit: Payer: Medicaid Other

## 2014-07-07 ENCOUNTER — Ambulatory Visit (HOSPITAL_BASED_OUTPATIENT_CLINIC_OR_DEPARTMENT_OTHER): Payer: Medicaid Other | Admitting: Physician Assistant

## 2014-07-07 VITALS — BP 114/91 | HR 76 | Temp 98.4°F | Resp 18 | Ht 72.0 in | Wt 164.9 lb

## 2014-07-07 DIAGNOSIS — Z720 Tobacco use: Secondary | ICD-10-CM

## 2014-07-07 DIAGNOSIS — R569 Unspecified convulsions: Secondary | ICD-10-CM

## 2014-07-07 DIAGNOSIS — C3412 Malignant neoplasm of upper lobe, left bronchus or lung: Secondary | ICD-10-CM

## 2014-07-07 DIAGNOSIS — C3492 Malignant neoplasm of unspecified part of left bronchus or lung: Secondary | ICD-10-CM

## 2014-07-07 DIAGNOSIS — E8809 Other disorders of plasma-protein metabolism, not elsewhere classified: Secondary | ICD-10-CM

## 2014-07-07 DIAGNOSIS — C3490 Malignant neoplasm of unspecified part of unspecified bronchus or lung: Secondary | ICD-10-CM

## 2014-07-07 DIAGNOSIS — C787 Secondary malignant neoplasm of liver and intrahepatic bile duct: Secondary | ICD-10-CM

## 2014-07-07 DIAGNOSIS — F1010 Alcohol abuse, uncomplicated: Secondary | ICD-10-CM

## 2014-07-07 DIAGNOSIS — Z95828 Presence of other vascular implants and grafts: Secondary | ICD-10-CM

## 2014-07-07 LAB — CBC WITH DIFFERENTIAL/PLATELET
BASO%: 0.4 % (ref 0.0–2.0)
Basophils Absolute: 0 10*3/uL (ref 0.0–0.1)
EOS%: 1.1 % (ref 0.0–7.0)
Eosinophils Absolute: 0 10*3/uL (ref 0.0–0.5)
HCT: 37.5 % — ABNORMAL LOW (ref 38.4–49.9)
HGB: 13 g/dL (ref 13.0–17.1)
LYMPH%: 19.4 % (ref 14.0–49.0)
MCH: 36.2 pg — AB (ref 27.2–33.4)
MCHC: 34.7 g/dL (ref 32.0–36.0)
MCV: 104.5 fL — AB (ref 79.3–98.0)
MONO#: 0.4 10*3/uL (ref 0.1–0.9)
MONO%: 14.5 % — AB (ref 0.0–14.0)
NEUT#: 1.8 10*3/uL (ref 1.5–6.5)
NEUT%: 64.6 % (ref 39.0–75.0)
PLATELETS: 102 10*3/uL — AB (ref 140–400)
RBC: 3.59 10*6/uL — AB (ref 4.20–5.82)
RDW: 12.8 % (ref 11.0–14.6)
WBC: 2.8 10*3/uL — ABNORMAL LOW (ref 4.0–10.3)
lymph#: 0.6 10*3/uL — ABNORMAL LOW (ref 0.9–3.3)

## 2014-07-07 LAB — COMPREHENSIVE METABOLIC PANEL (CC13)
ALBUMIN: 3.2 g/dL — AB (ref 3.5–5.0)
ALK PHOS: 381 U/L — AB (ref 40–150)
ALT: 79 U/L — ABNORMAL HIGH (ref 0–55)
AST: 168 U/L — AB (ref 5–34)
Anion Gap: 11 mEq/L (ref 3–11)
BUN: 6.7 mg/dL — ABNORMAL LOW (ref 7.0–26.0)
CO2: 32 mEq/L — ABNORMAL HIGH (ref 22–29)
CREATININE: 0.8 mg/dL (ref 0.7–1.3)
Calcium: 9.9 mg/dL (ref 8.4–10.4)
Chloride: 93 mEq/L — ABNORMAL LOW (ref 98–109)
Glucose: 257 mg/dl — ABNORMAL HIGH (ref 70–140)
POTASSIUM: 3.7 meq/L (ref 3.5–5.1)
Sodium: 135 mEq/L — ABNORMAL LOW (ref 136–145)
Total Bilirubin: 4.91 mg/dL (ref 0.20–1.20)
Total Protein: 6.7 g/dL (ref 6.4–8.3)

## 2014-07-07 LAB — MAGNESIUM (CC13): Magnesium: 1.5 mg/dl (ref 1.5–2.5)

## 2014-07-07 MED ORDER — SODIUM CHLORIDE 0.9 % IJ SOLN
10.0000 mL | INTRAMUSCULAR | Status: DC | PRN
  Administered 2014-07-07: 10 mL via INTRAVENOUS
  Filled 2014-07-07: qty 10

## 2014-07-07 MED ORDER — HEPARIN SOD (PORK) LOCK FLUSH 100 UNIT/ML IV SOLN
500.0000 [IU] | Freq: Once | INTRAVENOUS | Status: AC
  Administered 2014-07-07: 500 [IU] via INTRAVENOUS
  Filled 2014-07-07: qty 5

## 2014-07-07 NOTE — Progress Notes (Addendum)
[place]  Telephone:(336) [phone] Fax:(336) [phone]  PROGRESS NOTE  [name], DO [place] [address]  DIAGNOSIS: Extensive Stage small cell lung cancer diagnosed in September of 2014.  PRIOR THERAPY:  1) Systemic chemotherapy with carboplatin for an AUC of 5 given on day 1 and etoposide 120 mg per meter squared given on days 1, 2 and 3 with Neulasta support given on day 4 status post 2 cycles. Carboplatin was reduced to AUC of 4 and etoposide 100 mg/M2 starting from cycle #6, last dose was 09/20/2013. 2) Systemic chemotherapy with cisplatin 30 mg/M2 and irinotecan 65 mg/M2 on days 1 and 8 every 3 weeks first dose today 01/06/2014. Cisplatin was discontinued after cycle #1 secondary to hypersensitivity reaction.  3) Systemic chemotherapy with single agent irinotecan 65 mg/M2 on days 1 and 8 every 3 weeks. First dose 02/18/2014. Status post 4 cycles.  CURRENT THERAPY: Temodar 280 mg by mouth daily for 5 days every 4 weeks. First dose given 06/10/2014. Status post 1 cycle.  DISEASE STAGE: Extensive stage  CHEMOTHERAPY INTENT: Palliative  CURRENT # OF CHEMOTHERAPY CYCLES: 2 CURRENT ANTIEMETICS: Zofran, dexamethasone, Compazine  CURRENT SMOKING STATUS: Current smoker, strongly advised to quit smoking  ORAL CHEMOTHERAPY AND CONSENT: N./A.  CURRENT BISPHOSPHONATES USE: None  PAIN MANAGEMENT: Percocet  NARCOTICS INDUCED CONSTIPATION: None  LIVING WILL AND CODE STATUS: ?  INTERVAL HISTORY: William Townsend 53 y.o. male returns for a follow up visit. He tolerated the Temodar relatively well the exception of some malaise and nausea. The nausea is well managed with his current antiemetic. He had some constipation initially  while on the Temodar but his bowel movements as normalized. He reports he had a low blood sugar of 39 on Tuesday of this week. Apparently there issues with him he needed to get out and get food with his current  roommates. He is stocking up on things and going to make some soup and frees individual portions. He has not had any subsequent low blood sugar readings. He is otherwise felt well. He is adamant that he has decreased his daily drinking.  He denied having any significant chest pain, shortness of breath, cough or hemoptysis. The patient has no current nausea or vomiting. He has no fever or chills. He has no significant weight loss or night sweats.   MEDICAL HISTORY: Past Medical History  Diagnosis Date  . Atrial fibrillation   . Diabetes mellitus   . 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 Allergy   . Substance abuse   . Depression   . Stroke     3 previous strokes - Oct, Nov, and Dec 2012  . Throat cancer     September 2008  . Lung cancer     lobectomy in 2010  . Heart murmur   . Hx of radiation therapy 05/18/13- 06/01/13    whole brain, 30 Gy, 10 fractions    ALLERGIES:  is allergic to vicodin.  MEDICATIONS:  Current Outpatient Prescriptions  Medication Sig Dispense Refill  . bisacodyl (DULCOLAX) 5 MG EC tablet Take 5 mg by mouth daily.      . Blood Glucose Monitoring Suppl (ACCU-CHEK NANO SMARTVIEW) W/DEVICE KIT Measure blood sugar once daily prior to taking amaryl in the morning.  1 kit  0  . feeding supplement, ENSURE COMPLETE, (ENSURE COMPLETE) LIQD Take 237 mLs by mouth 2 (two) times daily between meals.  30 Bottle  0  . folic acid (FOLVITE) 1 MG  tablet TAKE 1 TABLET BY MOUTH ONCE DAILY  30 tablet  2  . gabapentin (NEURONTIN) 100 MG capsule Take 1 capsule (100 mg total) by mouth 3 (three) times daily.  90 capsule  2  . glimepiride (AMARYL) 2 MG tablet Take 1 tablet (2 mg total) by mouth daily with breakfast.  30 tablet  2  . glucose blood (ACCU-CHEK ACTIVE STRIPS) test strip Use as instructed  100 each  12  . guaiFENesin (MUCINEX) 600 MG 12 hr tablet Take 1 tablet (600 mg total) by mouth 2 (two) times daily.  30 tablet  0  . Lancet Devices (ACCU-CHEK SOFTCLIX) lancets Use as instructed   1 each  3  . levETIRAcetam (KEPPRA) 500 MG tablet Take 500 mg by mouth 2 (two) times daily. One half tablet twice a day for 2 weeks, then take one tablet twice a day      . loratadine (CLARITIN) 10 MG tablet Take 1 tablet (10 mg total) by mouth daily as needed for allergies.  30 tablet  0  . magnesium oxide (MAG-OX) 400 (241.3 MG) MG tablet TAKE 1 TABLET BY MOUTH THREE TIMES DAILY      . nortriptyline (PAMELOR) 25 MG capsule Take 1 capsule (25 mg total) by mouth at bedtime.  30 capsule  1  . omeprazole (PRILOSEC) 20 MG capsule Take 1 capsule (20 mg total) by mouth daily.  30 capsule  0  . ondansetron (ZOFRAN) 8 MG tablet Take 1 tablet (8 mg total) by mouth every 8 (eight) hours as needed for nausea or vomiting (start on the 3rd day after chemotherapy.).  30 tablet  1  . sulfamethoxazole-trimethoprim (BACTRIM DS,SEPTRA DS) 800-160 MG per tablet Take 1 tablet by mouth twice a day on Mondays and Thursdays  30 tablet  1  . thiamine 100 MG tablet Take 1 tablet (100 mg total) by mouth daily.  30 tablet  2  . diclofenac sodium (VOLTAREN) 1 % GEL Apply 2 g topically 4 (four) times daily.  100 g  1  . polyethylene glycol (MIRALAX / GLYCOLAX) packet Take 17 g by mouth daily.      Marland Kitchen temozolomide (TEMODAR) 140 MG capsule Take 2 capsules (280 mg total) by mouth daily. May take on an empty stomach or at bedtime to decrease nausea & vomiting.  10 capsule  2   No current facility-administered medications for this visit.   Facility-Administered Medications Ordered in Other Visits  Medication Dose Route Frequency Provider Last Rate Last Dose  . heparin lock flush 100 unit/mL  500 Units Intravenous Once Curt Bears, MD      . sodium chloride 0.9 % injection 10 mL  10 mL Intravenous PRN Curt Bears, MD   10 mL at 01/06/14 0830    SURGICAL HISTORY:  Past Surgical History  Procedure Laterality Date  . Lobectomy    . Craniotomy Left 04/27/2013    Procedure: Left Occipital Craniotomy for resection of tumor  with microscope;  Surgeon: Floyce Stakes, MD;  Location: [facility];  Service: Neurosurgery;  Laterality: Left;    REVIEW OF SYSTEMS:  Constitutional: positive for fatigue Eyes: negative Ears, nose, mouth, throat, and face: negative Respiratory: negative Cardiovascular: negative Gastrointestinal: negative Genitourinary:negative Integument/breast: negative Hematologic/lymphatic: negative Musculoskeletal:negative Neurological: negative Behavioral/Psych: negative Endocrine: negative Allergic/Immunologic: negative   PHYSICAL EXAMINATION: General appearance: alert, cooperative and no distress Head: Normocephalic, without obvious abnormality, atraumatic Neck: no adenopathy, no carotid bruit, no JVD, supple, symmetrical, trachea midline and thyroid not enlarged,  symmetric, no tenderness/mass/nodules Lymph nodes: Cervical, supraclavicular, and axillary nodes normal. Resp: clear to auscultation bilaterally Cardio: regular rate and rhythm, S1, S2 normal, no murmur, click, rub or gallop GI: soft, non-tender; bowel sounds normal; no masses,  no organomegaly Extremities: extremities normal, atraumatic, no cyanosis or edema Neurologic: Alert and oriented X 3, normal strength and tone. Normal symmetric reflexes. Normal coordination and gait Eyes: There is mild scleral icterus bilaterally  ECOG PERFORMANCE STATUS: 1 - Symptomatic but completely ambulatory  Blood pressure 114/91, pulse 76, temperature 98.4 F (36.9 C), temperature source Oral, resp. rate 18, height 6' (1.829 m), weight 164 lb 14.4 oz (74.798 kg), SpO2 100.00%.  LABORATORY DATA: Lab Results  Component Value Date   WBC 2.8* 07/07/2014   HGB 13.0 07/07/2014   HCT 37.5* 07/07/2014   MCV 104.5* 07/07/2014   PLT 102* 07/07/2014      Chemistry      Component Value Date/Time   NA 135* 07/07/2014 1108   NA 138 02/11/2014 0429   K 3.7 07/07/2014 1108   K 3.5* 02/11/2014 0429   CL 101 02/11/2014 0429   CO2 32* 07/07/2014 1108     CO2 26 02/11/2014 0429   BUN 6.7* 07/07/2014 1108   BUN 5* 02/11/2014 0429   CREATININE 0.8 07/07/2014 1108   CREATININE 0.74 02/11/2014 0429   CREATININE 1.04 01/28/2012 1454      Component Value Date/Time   CALCIUM 9.9 07/07/2014 1108   CALCIUM 8.4 02/11/2014 0429   ALKPHOS 381* 07/07/2014 1108   ALKPHOS 130* 02/10/2014 0302   AST 168* 07/07/2014 1108   AST 49* 02/10/2014 0302   ALT 79* 07/07/2014 1108   ALT 21 02/10/2014 0302   BILITOT 4.91* 07/07/2014 1108   BILITOT 1.5* 02/10/2014 0302       RADIOGRAPHIC STUDIES:  ASSESSMENT/PLAN:  This is a very pleasant 53 years old white male with extensive stage small cell lung cancer status post 6 cycles of systemic chemotherapy with carboplatin and etoposide. He was then tried on treatment with cisplatin and irinotecan but cisplatin was discontinued secondary to hypersensitivity reaction and the patient is currently continuing irinotecan as a single agent status post 4 cycles. This was discontinued secondary elevated liver enzymes as well as issues with transportation. Overall he tolerated his first cycle of Temodar relatively well. Patient was discussed with also seen by Dr. Julien Nordmann.  his chemistries were reviewed in his total bilirubin has doubled from 2.36 to 4.91. As stated above he has mild scleral icterus. He did not have any palpable hepatomegaly. He will continue with cycle 2 of Temodar however we will decrease the dose to 140 mg by mouth daily for 5 days. This prescription was called to the [facility] outpatient pharmacy. He'll return in one week for repeat chemistries and again in 2 weeks for another symptom management visit with repeat CBC differential and CMET.   He was again strongly encouraged to quit alcohol drinking.  For the history of seizure activity, the patient will continue his current treatment with Keppra. He was advised to call immediately if he has any concerning symptoms in the interval. All questions were answered. The patient  knows to call the clinic with any problems, questions or concerns. We can certainly see the patient much sooner if necessary.   Disclaimer: This note was dictated with voice recognition software. Similar sounding words can inadvertently be transcribed and may not be corrected upon review.  Carlton Adam, PA-C 07/07/2014  ADDENDUM: Hematology/Oncology Attending: I  had a face to face encounter with the patient. I recommended his care plan. This is a very pleasant 53 years old white male with extensive stage small cell lung cancer status post several chemotherapy regimens and recently started on treatment with oral Temodar status post 1 cycle. He tolerated the first cycle of his treatment fairly well with no significant adverse effects. The patient has a history of alcohol abuse but he is trying to cut down on his drinking. His liver enzymes are further elevated compared to 2 weeks ago. This could be secondary to his recent treatment with Temodar versus disease progression in the liver versus alcohol abuse. I strongly recommended for the patient to cut his drinking as much as he can. I would also reduce the dose of Temodar to 140 mg by mouth daily for 5 days every 4 weeks. We will continue to monitor his liver function closely on a weekly basis. He would come back for followup visit in 2 weeks for reevaluation and management any adverse effect of his treatment. The patient was advised to call immediately if he has any concerning symptoms in the interval.  Disclaimer: This note was dictated with voice recognition software. Similar sounding words can inadvertently be transcribed and may be missed upon review. Eilleen Kempf., MD 07/10/2014

## 2014-07-07 NOTE — Telephone Encounter (Signed)
Pt confirmed labs/ov per 10/29 POF, gave pt AVS...... KJ °

## 2014-07-07 NOTE — Patient Instructions (Signed)

## 2014-07-09 NOTE — Patient Instructions (Signed)
You are again strongly encouraged to discontinue drinking Your Temodar dose is being reduced to 140 mg by mouth daily for 5 days for cycle #2 Return in one week for repeat chemistry studies Follow-up in 2 weeks for another symptom management visit and repeat labs

## 2014-07-14 ENCOUNTER — Other Ambulatory Visit: Payer: Self-pay | Admitting: Family Medicine

## 2014-07-14 ENCOUNTER — Telehealth: Payer: Self-pay | Admitting: *Deleted

## 2014-07-14 ENCOUNTER — Encounter: Payer: Self-pay | Admitting: Family Medicine

## 2014-07-14 ENCOUNTER — Ambulatory Visit (HOSPITAL_BASED_OUTPATIENT_CLINIC_OR_DEPARTMENT_OTHER): Payer: Medicaid Other

## 2014-07-14 ENCOUNTER — Ambulatory Visit (INDEPENDENT_AMBULATORY_CARE_PROVIDER_SITE_OTHER): Payer: Medicaid Other | Admitting: Family Medicine

## 2014-07-14 ENCOUNTER — Other Ambulatory Visit (HOSPITAL_BASED_OUTPATIENT_CLINIC_OR_DEPARTMENT_OTHER): Payer: Medicaid Other

## 2014-07-14 VITALS — BP 126/82 | HR 123 | Temp 98.2°F | Ht 72.0 in | Wt 162.3 lb

## 2014-07-14 VITALS — BP 138/82 | HR 115 | Temp 98.8°F | Resp 20

## 2014-07-14 DIAGNOSIS — J309 Allergic rhinitis, unspecified: Secondary | ICD-10-CM

## 2014-07-14 DIAGNOSIS — C3412 Malignant neoplasm of upper lobe, left bronchus or lung: Secondary | ICD-10-CM

## 2014-07-14 DIAGNOSIS — Z95828 Presence of other vascular implants and grafts: Secondary | ICD-10-CM

## 2014-07-14 DIAGNOSIS — R569 Unspecified convulsions: Secondary | ICD-10-CM

## 2014-07-14 DIAGNOSIS — C3492 Malignant neoplasm of unspecified part of left bronchus or lung: Secondary | ICD-10-CM

## 2014-07-14 DIAGNOSIS — K219 Gastro-esophageal reflux disease without esophagitis: Secondary | ICD-10-CM

## 2014-07-14 DIAGNOSIS — E1142 Type 2 diabetes mellitus with diabetic polyneuropathy: Secondary | ICD-10-CM

## 2014-07-14 DIAGNOSIS — C3490 Malignant neoplasm of unspecified part of unspecified bronchus or lung: Secondary | ICD-10-CM

## 2014-07-14 LAB — COMPREHENSIVE METABOLIC PANEL (CC13)
ALBUMIN: 3.4 g/dL — AB (ref 3.5–5.0)
ALT: 81 U/L — ABNORMAL HIGH (ref 0–55)
AST: 187 U/L (ref 5–34)
Alkaline Phosphatase: 451 U/L — ABNORMAL HIGH (ref 40–150)
Anion Gap: 14 mEq/L — ABNORMAL HIGH (ref 3–11)
BUN: 8.8 mg/dL (ref 7.0–26.0)
CALCIUM: 10.5 mg/dL — AB (ref 8.4–10.4)
CO2: 33 meq/L — AB (ref 22–29)
Chloride: 88 mEq/L — ABNORMAL LOW (ref 98–109)
Creatinine: 1.2 mg/dL (ref 0.7–1.3)
GLUCOSE: 360 mg/dL — AB (ref 70–140)
Potassium: 4.1 mEq/L (ref 3.5–5.1)
Sodium: 135 mEq/L — ABNORMAL LOW (ref 136–145)
Total Bilirubin: 8.14 mg/dL (ref 0.20–1.20)
Total Protein: 7 g/dL (ref 6.4–8.3)

## 2014-07-14 MED ORDER — HEPARIN SOD (PORK) LOCK FLUSH 100 UNIT/ML IV SOLN
500.0000 [IU] | Freq: Once | INTRAVENOUS | Status: AC
  Administered 2014-07-14: 500 [IU] via INTRAVENOUS
  Filled 2014-07-14: qty 5

## 2014-07-14 MED ORDER — SODIUM CHLORIDE 0.9 % IJ SOLN
10.0000 mL | INTRAMUSCULAR | Status: DC | PRN
  Administered 2014-07-14: 10 mL via INTRAVENOUS
  Filled 2014-07-14: qty 10

## 2014-07-14 MED ORDER — GABAPENTIN 100 MG PO CAPS: 200.0000 mg | ORAL_CAPSULE | Freq: Three times a day (TID) | ORAL | Status: DC

## 2014-07-14 NOTE — Assessment & Plan Note (Signed)
Well-controlled. Current HgbA1c 6.5 (05/06/14), last HgbA1c 7.3 (01/2014). Complications - peripheral neuropathy, hypoglycemia x 1  Plan:  1. Continue Amaryl 2mg  daily with meal if AM fasting CBG >200 2. Continue regular meals x 3 daily, protein ensure supplement 3. Increase Gabapentin to 200mg  (2 capsules) TID (from 1 capsule TID), gradually increase 4. RTC 3 months for A1c

## 2014-07-14 NOTE — Assessment & Plan Note (Signed)
Continues on oral chemotherapy with Temodar 140mg  q cycles, with recent worsening elevated LFTs and T.Bili, suspected due to chemotherapy vs chronic alcohol abuse - Followed closely by Oncology, Dr. Earlie Server, for repeat labs and follow-up  Plan: 1. Continue follow-up with Oncology for repeat labs to monitor LFTs 2. Strongly encouraged to continue reduce EtOH intake

## 2014-07-14 NOTE — Patient Instructions (Signed)

## 2014-07-14 NOTE — Progress Notes (Signed)
Patient ID: William Townsend, male   DOB: 04-Jun-1961, 53 y.o.   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 - Reports that he has just recently finished IV chemotherapy, and now currently taking oral chemotherapy with 5 doses, then repeat dose in 3 weeks. Per chart review, on Temodar 140mg  (recently decreased) due to elevated LFTs. Last Oncology apt 1 week ago, was told that LFTs elevated, suspects that may be combination of chemotherapy and alcohol (down to 2-4 oz daily) - Scheduled to return to Oncology for labs 2x within next 2 weeks, then next f/u on 07/28/14 - Admits to feeling fatigued following chemotherapy  CHRONIC DM, Type 2: Reports - recently with low 39, called EMS due to significant weakness, improved with PO intake, states that he has not taken Amaryl regularly after hypoglycemic episode, plans to check CBG prior to taking medication CBGs: Avg 180, Low 39, High 300. Checks CBGs 1-2x daily Meds: Amaryl 2mg  daily with breakfast Reports good compliance. Tolerating well w/o side-effects Admits to 1x episode hypoglycemia, numbness Denies polyuria, visual changes  I have reviewed and updated the following as appropriate: allergies and current medications  Social Hx: - Active smoker - Reduced EtOH consumption, down to 2-4 oz liquor daily  Review of Systems  See above  HPI.     Objective:   Physical Exam  BP 126/82 mmHg  Pulse 123  Temp(Src) 98.2 F (36.8 C) (Oral)  Ht 6' (1.829 m)  Wt 162 lb 4.8 oz (73.619 kg)  BMI 22.01 kg/m2  Gen - thin and chronically ill, but well-appearing, NAD HEENT - NCAT, PERRL, EOMI, MMM Heart - tachycardic initial vitals, improved on exam, regular rhythm, no murmurs heard Lungs - CTAB. Normal work of breathing. Ext - bilateral lower ext no edema, non-tender, no erythema, +2 peripheral pulses intact Neuro - awake, alert, oriented, grossly non-focal, intact muscle strength 5/5 b/l grip and ankle, intact distal sensation to light touch, gait normal     Assessment:     See specific A&P problem list for details.      Plan:     See specific A&P problem list for details.

## 2014-07-14 NOTE — Patient Instructions (Signed)
Dear Milton Ferguson, Thank you for coming in to clinic today.  1. For your Nerve Pain in feet - increased Gabapentin dose, you may take up to 2 capsules 3 times a day. Start increasing dose slowly over next few weeks to see if you tolerate it. May make you more drowsy. 2. For your Diabetes - continue to regularly check blood sugar, especially fasting in the morning, if above >200 then you may take your Amaryl medication with a good breakfast. Continue Ensure to maintain calories and protein. 3. Anticipate that your weakness and drowsiness will improve off of IV chemo, continue to get rest, may take Advil-PM as needed. 4. Follow-up with Oncology.  Some important numbers from today's visit: BP - 126/82  Please schedule a follow-up appointment with Dr. Parks Ranger in 3 to 6 months  If you have any other questions or concerns, please feel free to call the clinic to contact me. You may also schedule an earlier appointment if necessary.  However, if your symptoms get significantly worse, please go to the Emergency Department to seek immediate medical attention.  [name], [place]

## 2014-07-14 NOTE — Telephone Encounter (Signed)
Per Dr Vista Mink, pt needs to have a CT scan and f/u within the next week.  Attempted to call patient, no answer, left msg to call back

## 2014-07-15 NOTE — Addendum Note (Signed)
Addended by: Safiyah Cisney, Colletta [place] L on: 07/15/2014 11:20 AM   Modules accepted: Orders

## 2014-07-15 NOTE — Telephone Encounter (Signed)
Pt called, attempted to call pt back, no answer, unable to leave msg.

## 2014-07-15 NOTE — Telephone Encounter (Signed)
Called pt back, spoke with patient, he verbalized understanding that schedulers will be in touch.

## 2014-07-21 ENCOUNTER — Ambulatory Visit (HOSPITAL_BASED_OUTPATIENT_CLINIC_OR_DEPARTMENT_OTHER): Payer: Medicaid Other

## 2014-07-21 ENCOUNTER — Ambulatory Visit (HOSPITAL_COMMUNITY): Payer: Medicaid Other

## 2014-07-21 ENCOUNTER — Other Ambulatory Visit (HOSPITAL_BASED_OUTPATIENT_CLINIC_OR_DEPARTMENT_OTHER): Payer: Medicaid Other

## 2014-07-21 DIAGNOSIS — Z452 Encounter for adjustment and management of vascular access device: Secondary | ICD-10-CM

## 2014-07-21 DIAGNOSIS — C3492 Malignant neoplasm of unspecified part of left bronchus or lung: Secondary | ICD-10-CM

## 2014-07-21 DIAGNOSIS — C3412 Malignant neoplasm of upper lobe, left bronchus or lung: Secondary | ICD-10-CM

## 2014-07-21 DIAGNOSIS — Z95828 Presence of other vascular implants and grafts: Secondary | ICD-10-CM

## 2014-07-21 LAB — COMPREHENSIVE METABOLIC PANEL (CC13)
ALBUMIN: 3 g/dL — AB (ref 3.5–5.0)
ALT: 76 U/L — AB (ref 0–55)
ANION GAP: 10 meq/L (ref 3–11)
AST: 162 U/L — AB (ref 5–34)
Alkaline Phosphatase: 435 U/L — ABNORMAL HIGH (ref 40–150)
BUN: 9 mg/dL (ref 7.0–26.0)
CALCIUM: 10.5 mg/dL — AB (ref 8.4–10.4)
CHLORIDE: 97 meq/L — AB (ref 98–109)
CO2: 30 meq/L — AB (ref 22–29)
CREATININE: 0.7 mg/dL (ref 0.7–1.3)
Glucose: 239 mg/dl — ABNORMAL HIGH (ref 70–140)
POTASSIUM: 4.4 meq/L (ref 3.5–5.1)
SODIUM: 138 meq/L (ref 136–145)
TOTAL PROTEIN: 6.9 g/dL (ref 6.4–8.3)
Total Bilirubin: 6.8 mg/dL (ref 0.20–1.20)

## 2014-07-21 LAB — CBC WITH DIFFERENTIAL/PLATELET
BASO%: 0.6 % (ref 0.0–2.0)
Basophils Absolute: 0 10*3/uL (ref 0.0–0.1)
EOS%: 1.1 % (ref 0.0–7.0)
Eosinophils Absolute: 0 10*3/uL (ref 0.0–0.5)
HEMATOCRIT: 38.1 % — AB (ref 38.4–49.9)
HGB: 13 g/dL (ref 13.0–17.1)
LYMPH#: 0.6 10*3/uL — AB (ref 0.9–3.3)
LYMPH%: 17.3 % (ref 14.0–49.0)
MCH: 36.2 pg — AB (ref 27.2–33.4)
MCHC: 34.1 g/dL (ref 32.0–36.0)
MCV: 106.1 fL — ABNORMAL HIGH (ref 79.3–98.0)
MONO#: 0.6 10*3/uL (ref 0.1–0.9)
MONO%: 17 % — ABNORMAL HIGH (ref 0.0–14.0)
NEUT#: 2.3 10*3/uL (ref 1.5–6.5)
NEUT%: 64 % (ref 39.0–75.0)
Platelets: 116 10*3/uL — ABNORMAL LOW (ref 140–400)
RBC: 3.59 10*6/uL — ABNORMAL LOW (ref 4.20–5.82)
RDW: 13.7 % (ref 11.0–14.6)
WBC: 3.5 10*3/uL — AB (ref 4.0–10.3)

## 2014-07-21 MED ORDER — HEPARIN SOD (PORK) LOCK FLUSH 100 UNIT/ML IV SOLN
500.0000 [IU] | Freq: Once | INTRAVENOUS | Status: AC
  Administered 2014-07-21: 500 [IU] via INTRAVENOUS
  Filled 2014-07-21: qty 5

## 2014-07-21 MED ORDER — SODIUM CHLORIDE 0.9 % IJ SOLN
10.0000 mL | INTRAMUSCULAR | Status: DC | PRN
  Administered 2014-07-21: 10 mL via INTRAVENOUS
  Filled 2014-07-21: qty 10

## 2014-07-21 NOTE — Patient Instructions (Signed)

## 2014-07-25 ENCOUNTER — Other Ambulatory Visit: Payer: Self-pay | Admitting: Physician Assistant

## 2014-07-25 ENCOUNTER — Telehealth: Payer: Self-pay | Admitting: Medical Oncology

## 2014-07-25 NOTE — Telephone Encounter (Signed)
Confirmed CT appt.

## 2014-07-26 ENCOUNTER — Ambulatory Visit (HOSPITAL_COMMUNITY)
Admission: RE | Admit: 2014-07-26 | Discharge: 2014-07-26 | Disposition: A | Discharge status: Home / Self Care | Payer: Medicaid Other | Source: Ambulatory Visit | Attending: Internal Medicine | Admitting: Internal Medicine

## 2014-07-26 ENCOUNTER — Encounter (HOSPITAL_COMMUNITY): Payer: Self-pay

## 2014-07-26 DIAGNOSIS — M479 Spondylosis, unspecified: Secondary | ICD-10-CM | POA: Diagnosis not present

## 2014-07-26 DIAGNOSIS — C787 Secondary malignant neoplasm of liver and intrahepatic bile duct: Secondary | ICD-10-CM | POA: Diagnosis not present

## 2014-07-26 DIAGNOSIS — C3490 Malignant neoplasm of unspecified part of unspecified bronchus or lung: Secondary | ICD-10-CM | POA: Insufficient documentation

## 2014-07-26 DIAGNOSIS — M5136 Other intervertebral disc degeneration, lumbar region: Secondary | ICD-10-CM | POA: Diagnosis not present

## 2014-07-26 DIAGNOSIS — I700 Atherosclerosis of aorta: Secondary | ICD-10-CM | POA: Insufficient documentation

## 2014-07-26 DIAGNOSIS — R599 Enlarged lymph nodes, unspecified: Secondary | ICD-10-CM | POA: Insufficient documentation

## 2014-07-26 DIAGNOSIS — C099 Malignant neoplasm of tonsil, unspecified: Secondary | ICD-10-CM | POA: Diagnosis present

## 2014-07-26 DIAGNOSIS — R188 Other ascites: Secondary | ICD-10-CM | POA: Diagnosis not present

## 2014-07-26 DIAGNOSIS — K769 Liver disease, unspecified: Secondary | ICD-10-CM | POA: Diagnosis not present

## 2014-07-26 MED ORDER — IOHEXOL 300 MG/ML  SOLN
100.0000 mL | Freq: Once | INTRAMUSCULAR | Status: AC | PRN
  Administered 2014-07-26: 100 mL via INTRAVENOUS

## 2014-07-28 ENCOUNTER — Telehealth: Payer: Self-pay | Admitting: Medical Oncology

## 2014-07-28 ENCOUNTER — Encounter: Payer: Self-pay | Admitting: Internal Medicine

## 2014-07-28 ENCOUNTER — Ambulatory Visit (HOSPITAL_BASED_OUTPATIENT_CLINIC_OR_DEPARTMENT_OTHER): Payer: Medicaid Other | Admitting: Internal Medicine

## 2014-07-28 ENCOUNTER — Other Ambulatory Visit (HOSPITAL_BASED_OUTPATIENT_CLINIC_OR_DEPARTMENT_OTHER): Payer: Medicaid Other

## 2014-07-28 ENCOUNTER — Ambulatory Visit (HOSPITAL_BASED_OUTPATIENT_CLINIC_OR_DEPARTMENT_OTHER): Payer: Medicaid Other

## 2014-07-28 VITALS — BP 145/80 | HR 82 | Temp 98.7°F | Resp 18 | Ht 72.0 in | Wt 160.8 lb

## 2014-07-28 DIAGNOSIS — C3492 Malignant neoplasm of unspecified part of left bronchus or lung: Secondary | ICD-10-CM

## 2014-07-28 DIAGNOSIS — Z95828 Presence of other vascular implants and grafts: Secondary | ICD-10-CM

## 2014-07-28 DIAGNOSIS — C3412 Malignant neoplasm of upper lobe, left bronchus or lung: Secondary | ICD-10-CM

## 2014-07-28 DIAGNOSIS — C3490 Malignant neoplasm of unspecified part of unspecified bronchus or lung: Secondary | ICD-10-CM

## 2014-07-28 DIAGNOSIS — C787 Secondary malignant neoplasm of liver and intrahepatic bile duct: Secondary | ICD-10-CM

## 2014-07-28 DIAGNOSIS — Z452 Encounter for adjustment and management of vascular access device: Secondary | ICD-10-CM

## 2014-07-28 LAB — COMPREHENSIVE METABOLIC PANEL (CC13)
ALBUMIN: 2.8 g/dL — AB (ref 3.5–5.0)
ALT: 69 U/L — AB (ref 0–55)
AST: 174 U/L (ref 5–34)
Alkaline Phosphatase: 640 U/L — ABNORMAL HIGH (ref 40–150)
Anion Gap: 13 mEq/L — ABNORMAL HIGH (ref 3–11)
BUN: 7.6 mg/dL (ref 7.0–26.0)
CALCIUM: 10.5 mg/dL — AB (ref 8.4–10.4)
CHLORIDE: 93 meq/L — AB (ref 98–109)
CO2: 30 mEq/L — ABNORMAL HIGH (ref 22–29)
Creatinine: 0.8 mg/dL (ref 0.7–1.3)
Glucose: 221 mg/dl — ABNORMAL HIGH (ref 70–140)
POTASSIUM: 4.5 meq/L (ref 3.5–5.1)
Sodium: 136 mEq/L (ref 136–145)
Total Bilirubin: 8.65 mg/dL (ref 0.20–1.20)
Total Protein: 6.9 g/dL (ref 6.4–8.3)

## 2014-07-28 LAB — CBC WITH DIFFERENTIAL/PLATELET
BASO%: 0.2 % (ref 0.0–2.0)
Basophils Absolute: 0 10*3/uL (ref 0.0–0.1)
EOS%: 1.1 % (ref 0.0–7.0)
Eosinophils Absolute: 0.1 10*3/uL (ref 0.0–0.5)
HCT: 38.9 % (ref 38.4–49.9)
HEMOGLOBIN: 13.5 g/dL (ref 13.0–17.1)
LYMPH%: 16.2 % (ref 14.0–49.0)
MCH: 37.1 pg — AB (ref 27.2–33.4)
MCHC: 34.7 g/dL (ref 32.0–36.0)
MCV: 106.9 fL — ABNORMAL HIGH (ref 79.3–98.0)
MONO#: 0.8 10*3/uL (ref 0.1–0.9)
MONO%: 16.6 % — ABNORMAL HIGH (ref 0.0–14.0)
NEUT#: 3.1 10*3/uL (ref 1.5–6.5)
NEUT%: 65.9 % (ref 39.0–75.0)
Platelets: 113 10*3/uL — ABNORMAL LOW (ref 140–400)
RBC: 3.64 10*6/uL — ABNORMAL LOW (ref 4.20–5.82)
RDW: 14.3 % (ref 11.0–14.6)
WBC: 4.6 10*3/uL (ref 4.0–10.3)
lymph#: 0.8 10*3/uL — ABNORMAL LOW (ref 0.9–3.3)

## 2014-07-28 LAB — MAGNESIUM (CC13): Magnesium: 1.4 mg/dl — CL (ref 1.5–2.5)

## 2014-07-28 MED ORDER — SODIUM CHLORIDE 0.9 % IJ SOLN
10.0000 mL | INTRAMUSCULAR | Status: DC | PRN
  Administered 2014-07-28: 10 mL via INTRAVENOUS
  Filled 2014-07-28: qty 10

## 2014-07-28 MED ORDER — HEPARIN SOD (PORK) LOCK FLUSH 100 UNIT/ML IV SOLN
500.0000 [IU] | Freq: Once | INTRAVENOUS | Status: AC
  Administered 2014-07-28: 500 [IU] via INTRAVENOUS
  Filled 2014-07-28: qty 5

## 2014-07-28 NOTE — Progress Notes (Signed)
[facility]  [phone] [phone]) [phone]  PROGRESS NOTE  [name], DO [place] [address]  DIAGNOSIS: Extensive Stage small cell lung cancer diagnosed in September of 2014.  PRIOR THERAPY:  1) Systemic chemotherapy with carboplatin for an AUC of 5 given on day 1 and etoposide 120 mg per meter squared given on days 1, 2 and 3 with Neulasta support given on day 4 status post 2 cycles. Carboplatin was reduced to AUC of 4 and etoposide 100 mg/M2 starting from cycle #6, last dose was 09/20/2013. 2) Systemic chemotherapy with cisplatin 30 mg/M2 and irinotecan 65 mg/M2 on days 1 and 8 every 3 weeks first dose today 01/06/2014. Cisplatin was discontinued after cycle #1 secondary to hypersensitivity reaction. 3) Systemic chemotherapy with single agent irinotecan 65 mg/M2 on days 1 and 8 every 3 weeks. First dose 02/18/2014. Status post 4 cycles.  CURRENT THERAPY: Temodar 280 mg by mouth daily for 5 days every 4 weeks. First dose given 06/10/2014. Status post 2 cycles.  DISEASE STAGE: Extensive stage  CHEMOTHERAPY INTENT: Palliative  CURRENT # OF CHEMOTHERAPY CYCLES: 2 CURRENT ANTIEMETICS: Zofran, dexamethasone, Compazine  CURRENT SMOKING STATUS: Current smoker, strongly advised to quit smoking  ORAL CHEMOTHERAPY AND CONSENT: N./A.  CURRENT BISPHOSPHONATES USE: None  PAIN MANAGEMENT: Percocet  NARCOTICS INDUCED CONSTIPATION: None  LIVING WILL AND CODE STATUS: ?  INTERVAL HISTORY: BRITTIN JANIK 53 y.o. male returns for a follow up visit. He continues to complain of increasing fatigue and weakness as well as jaundice. The patient tolerated the last 2 cycles of his treatment with Temodar fairly well with no significant adverse effects. He was treated with reduced dose for the second cycle secondary to liver dysfunction. He denied having any significant chest pain, shortness of breath, cough or hemoptysis. The patient has no  nausea or vomiting. He has no fever or chills. He has no significant weight loss or night sweats. The patient had repeat CT scan of the chest, abdomen and pelvis performed recently and he is here for evaluation and discussion of his scan results.  MEDICAL HISTORY: Past Medical History  Diagnosis Date  . Atrial fibrillation   . 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 Allergy   . Substance abuse   . Depression   . Stroke     3 previous strokes - Oct, Nov, and Dec 2012  . Heart murmur   . Hx of radiation therapy 05/18/13- 06/01/13    whole brain, 30 Gy, 10 fractions  . Throat cancer     September 2008  . Lung cancer     lobectomy in 2010  . Diabetes mellitus     ALLERGIES:  is allergic to vicodin.  MEDICATIONS:  Current Outpatient Prescriptions  Medication Sig Dispense Refill  . ACCU-CHEK SOFTCLIX LANCETS lancets CHECK BLOOD GLUCOSE 1-2 TIMES DAILY 100 each 4  . bisacodyl (DULCOLAX) 5 MG EC tablet Take 5 mg by mouth daily.    . Blood Glucose Monitoring Suppl (ACCU-CHEK NANO SMARTVIEW) W/DEVICE KIT Measure blood sugar once daily prior to taking amaryl in the morning. 1 kit 0  . diclofenac sodium (VOLTAREN) 1 % GEL Apply 2 g topically 4 (four) times daily. 100 g 1  . DOK 100 MG capsule TAKE 1 CAPSULE BY MOUTH 2 TIMES A DAY AS NEEDED FOR CONTIPATION 60 capsule 4  . feeding supplement, ENSURE COMPLETE, (ENSURE COMPLETE) LIQD Take 237 mLs by mouth 2 (two) times daily between meals. 30 Bottle 0  .  folic acid (FOLVITE) 1 MG tablet TAKE 1 TABLET BY MOUTH ONCE DAILY 30 tablet 2  . gabapentin (NEURONTIN) 100 MG capsule Take 2 capsules (200 mg total) by mouth 3 (three) times daily. 180 capsule 2  . glimepiride (AMARYL) 2 MG tablet TAKE 1 TABLET BY MOUTH ONCE DAILY WITH BREAKFAST 30 tablet 4  . glucose blood (ACCU-CHEK ACTIVE STRIPS) test strip Use as instructed 100 each 12  . levETIRAcetam (KEPPRA) 500 MG tablet TAKE 1 TABLET TWICE A DAY 60 tablet 4  . loratadine (CLARITIN) 10 MG tablet TAKE 1 TABLET  BY MOUTH ONCE DAILY 30 tablet 4  . magnesium oxide (MAG-OX) 400 (241.3 MG) MG tablet TAKE 1 TABLET BY MOUTH THREE TIMES DAILY    . nortriptyline (PAMELOR) 25 MG capsule Take 1 capsule (25 mg total) by mouth at bedtime. 30 capsule 1  . omeprazole (PRILOSEC) 20 MG capsule TAKE 1 CAPSULE BY MOUTH ONCE DAILY 30 capsule 4  . ondansetron (ZOFRAN) 8 MG tablet TAKE 1 TABLET BY MOUTH EVERY 8 HOURS AS NEEDED FOR N/V START ON 3RD DAY AFTER CHEMO 30 tablet 4  . polyethylene glycol (MIRALAX / GLYCOLAX) packet Take 17 g by mouth daily.    Marland Kitchen sulfamethoxazole-trimethoprim (BACTRIM DS,SEPTRA DS) 800-160 MG per tablet Take 1 tablet by mouth twice a day on Mondays and Thursdays 30 tablet 1  . temozolomide (TEMODAR) 140 MG capsule Take 2 capsules (280 mg total) by mouth daily. May take on an empty stomach or at bedtime to decrease nausea & vomiting. 10 capsule 2  . thiamine 100 MG tablet Take 1 tablet (100 mg total) by mouth daily. 30 tablet 2   No current facility-administered medications for this visit.   Facility-Administered Medications Ordered in Other Visits  Medication Dose Route Frequency Provider Last Rate Last Dose  . heparin lock flush 100 unit/mL  500 Units Intravenous Once Curt Bears, MD      . sodium chloride 0.9 % injection 10 mL  10 mL Intravenous PRN Curt Bears, MD   10 mL at 01/06/14 0830  . sodium chloride 0.9 % injection 10 mL  10 mL Intravenous PRN Curt Bears, MD   10 mL at 07/28/14 5956    SURGICAL HISTORY:  Past Surgical History  Procedure Laterality Date  . Lobectomy    . Craniotomy Left 04/27/2013    Procedure: Left Occipital Craniotomy for resection of tumor with microscope;  Surgeon: Floyce Stakes, MD;  Location: [facility];  Service: Neurosurgery;  Laterality: Left;    REVIEW OF SYSTEMS:  Constitutional: positive for fatigue Eyes: negative Ears, nose, mouth, throat, and face: negative Respiratory: negative Cardiovascular: negative Gastrointestinal:  negative Genitourinary:negative Integument/breast: negative Hematologic/lymphatic: negative Musculoskeletal:negative Neurological: negative Behavioral/Psych: negative Endocrine: negative Allergic/Immunologic: negative   PHYSICAL EXAMINATION: General appearance: alert, cooperative and no distress Head: Normocephalic, without obvious abnormality, atraumatic Neck: no adenopathy, no carotid bruit, no JVD, supple, symmetrical, trachea midline and thyroid not enlarged, symmetric, no tenderness/mass/nodules Lymph nodes: Cervical, supraclavicular, and axillary nodes normal. Resp: clear to auscultation bilaterally Cardio: regular rate and rhythm, S1, S2 normal, no murmur, click, rub or gallop GI: soft, non-tender; bowel sounds normal; no masses,  no organomegaly Extremities: extremities normal, atraumatic, no cyanosis or edema Neurologic: Alert and oriented X 3, normal strength and tone. Normal symmetric reflexes. Normal coordination and gait  ECOG PERFORMANCE STATUS: 1 - Symptomatic but completely ambulatory  Blood pressure 145/80, pulse 82, temperature 98.7 F (37.1 C), temperature source Oral, resp. rate 18, height 6' (1.829  m), weight 160 lb 12.8 oz (72.938 kg), SpO2 96 %.  LABORATORY DATA: Lab Results  Component Value Date   WBC 4.6 07/28/2014   HGB 13.5 07/28/2014   HCT 38.9 07/28/2014   MCV 106.9* 07/28/2014   PLT 113* 07/28/2014      Chemistry      Component Value Date/Time   NA 138 07/21/2014 0952   NA 138 02/11/2014 0429   K 4.4 07/21/2014 0952   K 3.5* 02/11/2014 0429   CL 101 02/11/2014 0429   CO2 30* 07/21/2014 0952   CO2 26 02/11/2014 0429   BUN 9.0 07/21/2014 0952   BUN 5* 02/11/2014 0429   CREATININE 0.7 07/21/2014 0952   CREATININE 0.74 02/11/2014 0429   CREATININE 1.04 01/28/2012 1454      Component Value Date/Time   CALCIUM 10.5* 07/21/2014 0952   CALCIUM 8.4 02/11/2014 0429   ALKPHOS 435* 07/21/2014 0952   ALKPHOS 130* 02/10/2014 0302   AST 162*  07/21/2014 0952   AST 49* 02/10/2014 0302   ALT 76* 07/21/2014 0952   ALT 21 02/10/2014 0302   BILITOT 6.80* 07/21/2014 0952   BILITOT 1.5* 02/10/2014 0302       RADIOGRAPHIC STUDIES: Ct Chest W Contrast  07/26/2014   CLINICAL DATA:  Lung cancer and tonsillar cancer.  EXAM: CT CHEST, ABDOMEN, AND PELVIS WITH CONTRAST  TECHNIQUE: Multidetector CT imaging of the chest, abdomen and pelvis was performed following the standard protocol during bolus administration of intravenous contrast.  CONTRAST:  160mL OMNIPAQUE IOHEXOL 300 MG/ML  SOLN  COMPARISON:  CTA chest 02/09/14 and CT abdomen and pelvis 02/10/2014.  FINDINGS: CT CHEST FINDINGS  Mediastinum: The heart size appears normal. No pericardial effusion identified. The trachea appears patent and is midline. Normal appearance of the esophagus. Calcified atherosclerotic disease involves the thoracic aorta as well as the LAD, left circumflex and RCA coronary arteries. Index posterior mediastinal lymph node measures 1.9 cm, image 51/series 2. Previously 1.6 cm.  Lungs/Pleura: There is no pleural effusion identified. Left upper lobe lung lesion measures 2.5 x 3.5 cm, image 21/ series 5. Previously 2.3 x 3.5 cm. Previously noted airspace opacity within the right middle lobe has resolved, likely infectious or inflammatory. Index nodule within the left lower lobe measures 5 mm, image 45/ series 5. Previously 3 mm. 6 mm right lower lobe nodule is identified, image 37/series 5. Previously 3 mm.  Musculoskeletal: Review of the visualized bony structures is significant for mild multilevel thoracic spondylosis. No aggressive lytic or sclerotic bone lesions identified.  CT ABDOMEN AND PELVIS FINDINGS  Hepatobiliary: Multiple liver lesions are again identified involving both lobes. Compared with previous exam there has been significant interval progression of disease. On today's study there are innumerable lesions in both lobes. Index lesion in the right hepatic lobe  measures 10.x 7.9 cm, image 58/series 2. Previously 4.6 x 4.4 cm. Left hepatic lobe lesion measures 4.3 x 4.2 cm, image 59/series 2. Previously this was measured at 1.6 x 1.9 cm. The index lesion within segment 6 of the liver measures 5.9 x 7.9 cm, image 84/series 2. Previously this measured 2.4 x 3.7 cm. Stones are identified within the gallbladder. There is no biliary dilatation.  Pancreas: Normal appearance of the pancreas.  Spleen: Large area of relative hypodensity involving the spleen is identified measuring 6 x 2.9 cm, image 73/series 2. This is favored to represent splenic infarction.  Adrenals/Urinary Tract: The adrenal glands are both normal. The kidneys are both unremarkable. Normal appearance of  the bladder.  Stomach/Bowel: The stomach is within normal limits. The small bowel loops have a normal course and caliber. No obstruction. Normal appearance of the colon.  Vascular/Lymphatic: The abdominal aorta is normal in caliber. There is calcified atherosclerotic disease identified. Enlarged upper abdominal lymph nodes are noted. Index lymph node in the gastrohepatic ligament region measures 1.4 x 2.8 cm, image 63/series 2. This is compared with 0.8 x 1.3 cm. Porta hepatis lymph node measures 1.3 x 1.8 cm this is compared with 1.2 x 1.5 cm previously. No enlarged pelvic or inguinal lymph nodes.  Reproductive: Prostate gland and seminal vesicles are unremarkable. Calcifications within the base of penis identified.  Other: Interval development of upper abdominal/ perihepatic ascites. There is a small amount of pelvic ascites noted.  Musculoskeletal: Review of the visualized bony structures shows changes from lumbar degenerative disc disease. No aggressive lytic or sclerotic bone lesion identified.  IMPRESSION: 1. Mild progression of disease within the chest. The posterior mediastinal lymph node has increased in size in the interval. There are several small nodules which have increased in size in the interval.  The dominant lesion within the left upper lobe is not significantly changed. 2. Significant interval progression of liver metastasis. 3. New abdominal ascites. 4. Splenic infarction.   Electronically Signed   By: Signa Kell M.D.   On: 07/26/2014 13:07   Ct Abdomen Pelvis W Contrast  07/26/2014   CLINICAL DATA:  Lung cancer and tonsillar cancer.  EXAM: CT CHEST, ABDOMEN, AND PELVIS WITH CONTRAST  TECHNIQUE: Multidetector CT imaging of the chest, abdomen and pelvis was performed following the standard protocol during bolus administration of intravenous contrast.  CONTRAST:  OMNIPAQUE IOHEXOL 300 MG/ML  SOLN  COMPARISON:  CTA chest 02/09/14 and CT abdomen and pelvis 02/10/2014.  FINDINGS: CT CHEST FINDINGS  Mediastinum: The heart size appears normal. No pericardial effusion identified. The trachea appears patent and is midline. Normal appearance of the esophagus. Calcified atherosclerotic disease involves the thoracic aorta as well as the LAD, left circumflex and RCA coronary arteries. Index posterior mediastinal lymph node measures 1.9 cm, image 51/series 2. Previously 1.6 cm.  Lungs/Pleura: There is no pleural effusion identified. Left upper lobe lung lesion measures 2.5 x 3.5 cm, image 21/ series 5. Previously 2.3 x 3.5 cm. Previously noted airspace opacity within the right middle lobe has resolved, likely infectious or inflammatory. Index nodule within the left lower lobe measures 5 mm, image 45/ series 5. Previously 3 mm. 6 mm right lower lobe nodule is identified, image 37/series 5. Previously 3 mm.  Musculoskeletal: Review of the visualized bony structures is significant for mild multilevel thoracic spondylosis. No aggressive lytic or sclerotic bone lesions identified.  CT ABDOMEN AND PELVIS FINDINGS  Hepatobiliary: Multiple liver lesions are again identified involving both lobes. Compared with previous exam there has been significant interval progression of disease. On today's study there are  innumerable lesions in both lobes. Index lesion in the right hepatic lobe measures 10.x 7.9 cm, image 58/series 2. Previously 4.6 x 4.4 cm. Left hepatic lobe lesion measures 4.3 x 4.2 cm, image 59/series 2. Previously this was measured at 1.6 x 1.9 cm. The index lesion within segment 6 of the liver measures 5.9 x 7.9 cm, image 84/series 2. Previously this measured 2.4 x 3.7 cm. Stones are identified within the gallbladder. There is no biliary dilatation.  Pancreas: Normal appearance of the pancreas.  Spleen: Large area of relative hypodensity involving the spleen is identified measuring 6 x 2.9  cm, image 73/series 2. This is favored to represent splenic infarction.  Adrenals/Urinary Tract: The adrenal glands are both normal. The kidneys are both unremarkable. Normal appearance of the bladder.  Stomach/Bowel: The stomach is within normal limits. The small bowel loops have a normal course and caliber. No obstruction. Normal appearance of the colon.  Vascular/Lymphatic: The abdominal aorta is normal in caliber. There is calcified atherosclerotic disease identified. Enlarged upper abdominal lymph nodes are noted. Index lymph node in the gastrohepatic ligament region measures 1.4 x 2.8 cm, image 63/series 2. This is compared with 0.8 x 1.3 cm. Porta hepatis lymph node measures 1.3 x 1.8 cm this is compared with 1.2 x 1.5 cm previously. No enlarged pelvic or inguinal lymph nodes.  Reproductive: Prostate gland and seminal vesicles are unremarkable. Calcifications within the base of penis identified.  Other: Interval development of upper abdominal/ perihepatic ascites. There is a small amount of pelvic ascites noted.  Musculoskeletal: Review of the visualized bony structures shows changes from lumbar degenerative disc disease. No aggressive lytic or sclerotic bone lesion identified.  IMPRESSION: 1. Mild progression of disease within the chest. The posterior mediastinal lymph node has increased in size in the interval. There  are several small nodules which have increased in size in the interval. The dominant lesion within the left upper lobe is not significantly changed. 2. Significant interval progression of liver metastasis. 3. New abdominal ascites. 4. Splenic infarction.   Electronically Signed   By: Kerby Moors M.D.   On: 07/26/2014 13:07   ASSESSMENT/PLAN:  This is a very pleasant 53 years old white male with extensive stage small cell lung cancer status post 6 cycles of systemic chemotherapy with carboplatin and etoposide. He was then tried on treatment with cisplatin and irinotecan but cisplatin was discontinued secondary to hypersensitivity reaction and the patient is currently continuing irinotecan as a single agent status post 4 cycles. He is tolerating his treatment fairly well but this treatment was discontinued secondary to disease progression. The patient was started on treatment with Temodar for 2 cycles but unfortunately the recent CT scan of the chest, abdomen and pelvis showed significant disease progression especially in the liver. I had a lengthy discussion with the patient today about his current disease condition and treatment options. Unfortunately his condition is deteriorating rapidly. I strongly recommended for him to consider hospice and palliative care at this point. The patient is agreeable to this option. For the history of seizure activity, the patient will continue his current treatment with Keppra. I would see him on as-needed basis at this point. He was advised to call immediately if he has any concerning symptoms. All questions were answered. The patient knows to call the clinic with any problems, questions or concerns. We can certainly see the patient much sooner if necessary.   Disclaimer: This note was dictated with voice recognition software. Similar sounding words can inadvertently be transcribed and may not be corrected upon review.  Eilleen Kempf., MD 07/28/2014

## 2014-07-28 NOTE — Telephone Encounter (Signed)
Hospice referral done.

## 2014-07-28 NOTE — Patient Instructions (Signed)

## 2014-07-28 NOTE — Patient Instructions (Signed)
Smoking Cessation Quitting smoking is important to your health and has many advantages. However, it is not always easy to quit since nicotine is a very addictive drug. Oftentimes, people try 3 times or more before being able to quit. This document explains the best ways for you to prepare to quit smoking. Quitting takes hard work and a lot of effort, but you can do it. ADVANTAGES OF QUITTING SMOKING  You will live longer, feel better, and live better.  Your body will feel the impact of quitting smoking almost immediately.  Within 20 minutes, blood pressure decreases. Your pulse returns to its normal level.  After 8 hours, carbon monoxide levels in the blood return to normal. Your oxygen level increases.  After 24 hours, the chance of having a heart attack starts to decrease. Your breath, hair, and body stop smelling like smoke.  After 48 hours, damaged nerve endings begin to recover. Your sense of taste and smell improve.  After 72 hours, the body is virtually free of nicotine. Your bronchial tubes relax and breathing becomes easier.  After 2 to 12 weeks, lungs can hold more air. Exercise becomes easier and circulation improves.  The risk of having a heart attack, stroke, cancer, or lung disease is greatly reduced.  After 1 year, the risk of coronary heart disease is cut in half.  After 5 years, the risk of stroke falls to the same as a nonsmoker.  After 10 years, the risk of lung cancer is cut in half and the risk of other cancers decreases significantly.  After 15 years, the risk of coronary heart disease drops, usually to the level of a nonsmoker.  If you are pregnant, quitting smoking will improve your chances of having a healthy baby.  The people you live with, especially any children, will be healthier.  You will have extra money to spend on things other than cigarettes. QUESTIONS TO THINK ABOUT BEFORE ATTEMPTING TO QUIT You may want to talk about your answers with your  health care provider.  Why do you want to quit?  If you tried to quit in the past, what helped and what did not?  What will be the most difficult situations for you after you quit? How will you plan to handle them?  Who can help you through the tough times? Your family? Friends? A health care provider?  What pleasures do you get from smoking? What ways can you still get pleasure if you quit? Here are some questions to ask your health care provider:  How can you help me to be successful at quitting?  What medicine do you think would be best for me and how should I take it?  What should I do if I need more help?  What is smoking withdrawal like? How can I get information on withdrawal? GET READY  Set a quit date.  Change your environment by getting rid of all cigarettes, ashtrays, matches, and lighters in your home, car, or work. Do not let people smoke in your home.  Review your past attempts to quit. Think about what worked and what did not. GET SUPPORT AND ENCOURAGEMENT You have a better chance of being successful if you have help. You can get support in many ways.  Tell your family, friends, and coworkers that you are going to quit and need their support. Ask them not to smoke around you.  Get individual, group, or telephone counseling and support. Programs are available at local hospitals and health centers. Call   your local health department for information about programs in your area.  Spiritual beliefs and practices may help some smokers quit.  Download a "quit meter" on your computer to keep track of quit statistics, such as how long you have gone without smoking, cigarettes not smoked, and money saved.  Get a self-help book about quitting smoking and staying off tobacco. LEARN NEW SKILLS AND BEHAVIORS  Distract yourself from urges to smoke. Talk to someone, go for a walk, or occupy your time with a task.  Change your normal routine. Take a different route to work.  Drink tea instead of coffee. Eat breakfast in a different place.  Reduce your stress. Take a hot bath, exercise, or read a book.  Plan something enjoyable to do every day. Reward yourself for not smoking.  Explore interactive web-based programs that specialize in helping you quit. GET MEDICINE AND USE IT CORRECTLY Medicines can help you stop smoking and decrease the urge to smoke. Combining medicine with the above behavioral methods and support can greatly increase your chances of successfully quitting smoking.  Nicotine replacement therapy helps deliver nicotine to your body without the negative effects and risks of smoking. Nicotine replacement therapy includes nicotine gum, lozenges, inhalers, nasal sprays, and skin patches. Some may be available over-the-counter and others require a prescription.  Antidepressant medicine helps people abstain from smoking, but how this works is unknown. This medicine is available by prescription.  Nicotinic receptor partial agonist medicine simulates the effect of nicotine in your brain. This medicine is available by prescription. Ask your health care provider for advice about which medicines to use and how to use them based on your health history. Your health care provider will tell you what side effects to look out for if you choose to be on a medicine or therapy. Carefully read the information on the package. Do not use any other product containing nicotine while using a nicotine replacement product.  RELAPSE OR DIFFICULT SITUATIONS Most relapses occur within the first 3 months after quitting. Do not be discouraged if you start smoking again. Remember, most people try several times before finally quitting. You may have symptoms of withdrawal because your body is used to nicotine. You may crave cigarettes, be irritable, feel very hungry, cough often, get headaches, or have difficulty concentrating. The withdrawal symptoms are only temporary. They are strongest  when you first quit, but they will go away within 10-14 days. To reduce the chances of relapse, try to:  Avoid drinking alcohol. Drinking lowers your chances of successfully quitting.  Reduce the amount of caffeine you consume. Once you quit smoking, the amount of caffeine in your body increases and can give you symptoms, such as a rapid heartbeat, sweating, and anxiety.  Avoid smokers because they can make you want to smoke.  Do not let weight gain distract you. Many smokers will gain weight when they quit, usually less than 10 pounds. Eat a healthy diet and stay active. You can always lose the weight gained after you quit.  Find ways to improve your mood other than smoking. FOR MORE INFORMATION  www.smokefree.gov  Document Released: 08/20/2001 Document Revised: 01/10/2014 Document Reviewed: 12/05/2011 [employer] Patient Information 2015 [employer]. This information is not intended to replace advice given to you by your health care provider. Make sure you discuss any questions you have with your health care provider.  

## 2014-08-03 ENCOUNTER — Telehealth: Payer: Self-pay | Admitting: *Deleted

## 2014-08-03 NOTE — Telephone Encounter (Signed)
Per patient request, medicaid transportation forms for office visits 11/5, 11/12, and 11/19 faxed to [phone]

## 2014-08-09 ENCOUNTER — Telehealth: Payer: Self-pay | Admitting: Family Medicine

## 2014-08-09 NOTE — Telephone Encounter (Signed)
Pt is requesting Dr. Parks Ranger become his primary MD for hospice instead of his current oncologist.

## 2014-08-09 NOTE — Telephone Encounter (Signed)
[facility] and informed her that MD would be out of the office until 12/8, but we would route request to him.

## 2014-08-17 NOTE — Telephone Encounter (Signed)
Lisa with [facility] to get an order or to see if Dr. Parks Ranger would agree to be Pt's primary PCP.  Also to co-manage hospice care with Dr. Lyman Speller.  Please give her a call at [phone] as soon as possible.  Derl Barrow, RN

## 2014-08-17 NOTE — Telephone Encounter (Signed)
William Townsend at [facility] to discuss future hospice care for PCP patient William Townsend. She stated that he has requested me to be his primary provider while he is at hospice, no longer receiving care from Dr. Julien Nordmann. Lattie Haw states that a hospice CSW will complete paperwork reflecting this change, and the plan is for me to co-manage [name]'s hospice care with establish hospice physician, Dr. Lyman Speller. I advised her that I would speak with [facility] faculty regarding this plan, and complete the Portal Agreement and fax back to [place]. She will contact back with any other needs at this time.  [name], [place], PGY-2

## 2014-08-18 ENCOUNTER — Telehealth: Payer: Self-pay | Admitting: Family Medicine

## 2014-08-18 NOTE — Telephone Encounter (Signed)
Pt fell on Tuesday-didn't lose conscience ness Lattie Haw is his primary nurse

## 2014-08-19 ENCOUNTER — Ambulatory Visit (INDEPENDENT_AMBULATORY_CARE_PROVIDER_SITE_OTHER): Payer: Medicaid Other | Admitting: Family Medicine

## 2014-08-19 ENCOUNTER — Encounter: Payer: Self-pay | Admitting: Family Medicine

## 2014-08-19 VITALS — BP 144/83 | HR 128 | Temp 98.0°F | Ht 72.0 in | Wt 164.0 lb

## 2014-08-19 DIAGNOSIS — Z515 Encounter for palliative care: Secondary | ICD-10-CM

## 2014-08-19 DIAGNOSIS — R531 Weakness: Secondary | ICD-10-CM

## 2014-08-19 DIAGNOSIS — C3492 Malignant neoplasm of unspecified part of left bronchus or lung: Secondary | ICD-10-CM

## 2014-08-19 DIAGNOSIS — E1142 Type 2 diabetes mellitus with diabetic polyneuropathy: Secondary | ICD-10-CM

## 2014-08-19 DIAGNOSIS — R569 Unspecified convulsions: Secondary | ICD-10-CM

## 2014-08-19 DIAGNOSIS — K219 Gastro-esophageal reflux disease without esophagitis: Secondary | ICD-10-CM

## 2014-08-19 MED ORDER — LEVETIRACETAM 500 MG PO TABS: 500.0000 mg | ORAL_TABLET | Freq: Two times a day (BID) | ORAL | Status: AC

## 2014-08-19 MED ORDER — OMEPRAZOLE 20 MG PO CPDR: 20.0000 mg | DELAYED_RELEASE_CAPSULE | Freq: Every day | ORAL | Status: AC

## 2014-08-19 MED ORDER — GABAPENTIN 100 MG PO CAPS: 200.0000 mg | ORAL_CAPSULE | Freq: Three times a day (TID) | ORAL | Status: AC

## 2014-08-19 NOTE — Assessment & Plan Note (Signed)
High fall risk, recent fall 3 days ago, suspected mechanical due to slip outside, most likely multifactorial 2/2 generalized weakness  Plan: 1. Encouraged to always use some form of aid assist with ambulation in/outdoors 2. Improved with inc diet, taking ensure daily 3. Continue analgesia - Naproxen, Gabapentin

## 2014-08-19 NOTE — Assessment & Plan Note (Signed)
Stable, well controlled Last A1c 6.5 (8/88/28) Complications - peripheral neuropathy - Resolved hypoglycemia (improved appetite)  Plan: 1. Check CBGs q 1-3 days and PRN symptomatic. If CBG >250, consider resuming Amaryl 2mg  as needed, otherwise recommend discontinue Amaryl to avoid future hypoglycemia 2. No repeat A1c at this time 3. Continue inc Gabapentin dose 200mg  TID, can further titrate if worsening 4. Continue improved dietary plan - regular meals

## 2014-08-19 NOTE — Assessment & Plan Note (Addendum)
Currently enrolled with home hospice x 1 month - Co-managing care with Dr. Lyman Speller (palliative care, [facility]), will follow updates via physician portal - No longer followed by Oncology for palliative chemo  Plan: 1. Encouraged pt to take advantage of hospice services, more regular nurse visits 2. rx medication assistance (transitioning to hospice, sent multiple rx[facility] per pt request) - asked to call if any changes need to be made 3. Continue ambulation assistance to avoid future falls 4. Advised to notify / discuss future travel plans with hospice, agreeable to plan if coordinated with hospice 5. RTC PRN  Greater than 25 minutes were spent in face-to-face time with this patient, more than half of which was devoted to counseling and development/coordination of care and treatment planning.

## 2014-08-19 NOTE — Progress Notes (Signed)
Patient ID: William Townsend, male   DOB: [birth year], 53 y.o.   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 Decision to no longer receive chemotherapy, not followed by Dr. [facility] - Recently started with hospice over past 1 month. Stated that hospice nursing is coming out regularly to home (currently 1x weekly per his wishes, but available up to 5x weekly). His wishes are to remain at home as long as he can walk and is functional, currently using cane and walker. Using hospice chaplain via phone as needed. - Stated he fell 3 days ago after slipping on some stepping stones, and stated his head hit the ground, did not lose consciousness, few minutes of nausea, no bleeding, loss of vision, otherwise mostly HA/pain which has improved, but persisted for about 2 days. Advised by nursing to take Naproxen 500mg  BID with significant improvement. - Rx plan - previously through [place] mail order, now Hospice recommending to cover all rx, will [facility] - Has volunteer who picks him up on Sunday mornings to go to [employer], for regular grocery shopping - Wants to fly to [place] to visit best friend x 30 years  CHRONIC DM, Type 2: Reports  - improved CBGs, states he is eating better (inc vegetables, frozen vegetables with cheese, lasagna), checks CBG ever 2-3 days or as needed, has not taken Amaryl in >1 week. - nerve pain in feet improved on higher gabapentin dose CBGs: Avg 150s, Low >100, High < 200 Meds: holding Amaryl Reports good compliance. Tolerating well w/o side-effects Denies any episodes of hypoglycemia, polyuria, visual changes  I have reviewed and updated the following as appropriate: allergies and current medications  Social Hx: - Active  smoker  Review of Systems  See above HPI.     Objective:   Physical Exam  BP 144/83 mmHg  Pulse 128  Temp(Src) 98 F (36.7 C) (Oral)  Ht 6' (1.829 m)  Wt 164 lb (74.39 kg)  BMI 22.24 kg/m2  Gen - chronically ill appears slightly more cachectic but stable wt, well-appearing today, NAD HEENT - NCAT, no evidence of ecchymosis, swelling, or laceration on scalp, PERRL, EOMI, MMM Heart - tachycardic, regular rhythm, no murmurs heard Lungs - CTAB. Normal work of breathing. Ext - no edema, non-tender, +2 peripheral pulses intact Neuro - awake, alert, oriented (person, place, location, year), CN2-12 intact, grossly non-focal, intact muscle strength 5/5 b/l grip and ankle, intact distal sensation to light touch, gait normal with assistance of rolling suitcase     Assessment:     See specific A&P problem list for details.      Plan:     See specific A&P problem list for details.

## 2014-08-19 NOTE — Patient Instructions (Signed)
Dear William Townsend, Thank you for coming in to clinic today. It was good to see you!  1. I support your decision to proceed with Hospice. I encourage you to utilize them as needed up to multiple times weekly if you need. As discussed, I will be co-managing with Dr. Lyman Speller. However, for any acute concerns or anything comfort related, Hospice will be the primary ones to go to. 2. I have sent some refills of medications to [facility] for Hospice to cover them. Please let me know if there are any other medications that you need changed. 3. Continue Gabapentin 4. For Diabetes - continue as you are now, check sugar as needed or if feeling bad, as long as < 250, do not take Amaryl. After about 1 month if doing well, will discontinue Amaryl 5. Continue eating well, Ensure as needed 6. Highly encourage you to use your assistance devices, cane / walker even outside if you can for best support to avoid falls 7. Check with Hospice before your travel plans - they may be able to assist you further, and will need to know when you are gone.  Please schedule a follow-up appointment with Dr. Parks Ranger in 3 to 6 months or sooner if needed  If you have any other questions or concerns, please feel free to call the clinic to contact me. You may also schedule an earlier appointment if necessary.  However, if your symptoms get significantly worse, please go to the Emergency Department to seek immediate medical attention.  William Townsend, William Townsend

## 2014-09-13 ENCOUNTER — Encounter: Payer: Self-pay | Admitting: Family Medicine

## 2014-09-16 NOTE — Progress Notes (Signed)
On 2014/10/03, received fax notification from [facility] (via Hospice and [facility]) that patient had passed away on 10/03/2014, time of death 27:63RE. Death certificate received, completed on 09/14/14 submitted to [facility] for processing. All pending orders for Hospice signed through Physician's Portal. Patient status changed to deceased in Epic EMR on 2014-10-06.  [name], Luis Lopez, PGY-2

## 2014-10-10 DEATH — deceased

## 2014-10-18 ENCOUNTER — Other Ambulatory Visit: Payer: Self-pay | Admitting: Pharmacist

## 2014-10-27 ENCOUNTER — Ambulatory Visit: Payer: Medicaid Other | Admitting: Adult Health

## 2015-01-28 IMAGING — CT CT ABDOMEN W/ CM
2 of 4 series · 15 of 46 positions shown, 17 images · IV contrast (OMNIPAQUE)
Comparison: 10/07/2013

CLINICAL DATA: Lung cancer diagnosed in 1484 and 2517. Tonsillar
cancer diagnosed in 4116. Chemotherapy in progress. Status post
right upper lobectomy. Brain and liver metastasis. No current
complaints. Substance abuse. Liver disease secondary to alcohol.

EXAM:
CT CHEST, ABDOMEN, AND PELVIS WITH CONTRAST
TECHNIQUE: Multidetector CT imaging of the chest, abdomen and pelvis was
performed following the standard protocol during bolus
administration of intravenous contrast.
CONTRAST:  100mL OMNIPAQUE IOHEXOL 300 MG/ML  SOLN

[Series 2: ca with st · axial · 0.75mm/px · z∈[-485,-75]mm · 12 of 90 slices shown, 14 images]
[im 4/90  soft-tissue]
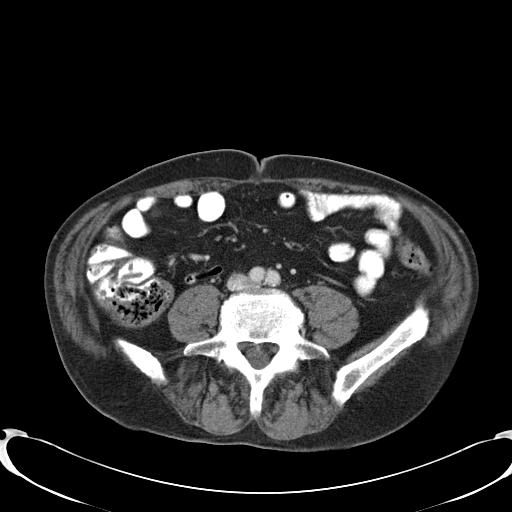
[im 4/90  bone]
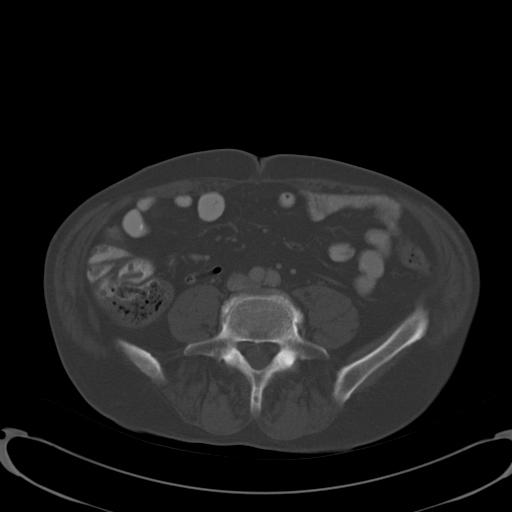
[im 12/90  soft-tissue]
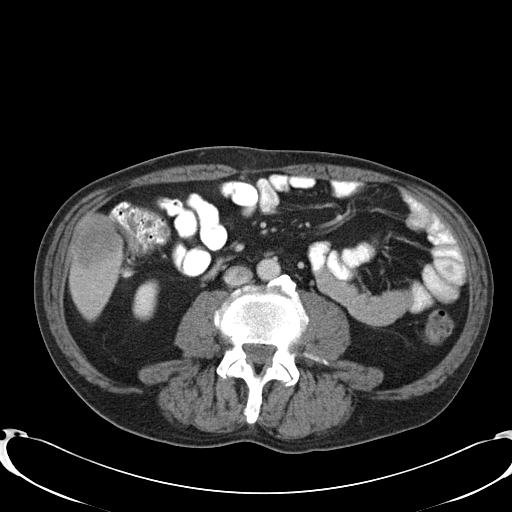
[im 20/90  soft-tissue]
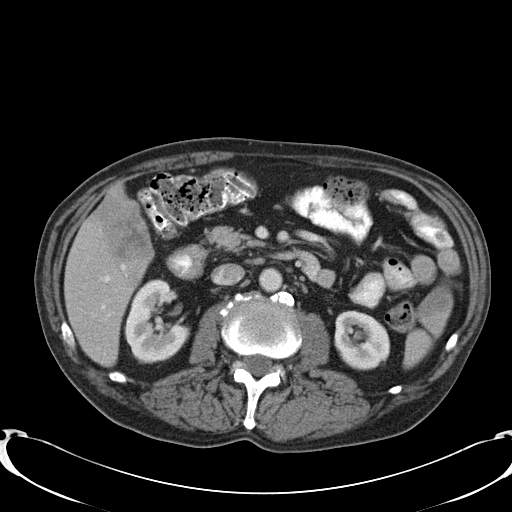
[im 28/90  soft-tissue]
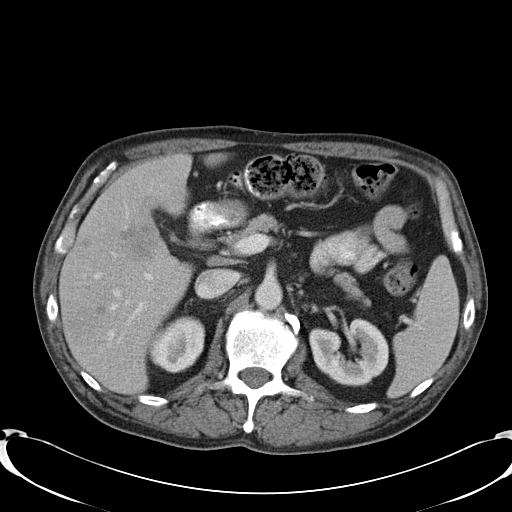
[im 35/90  soft-tissue]
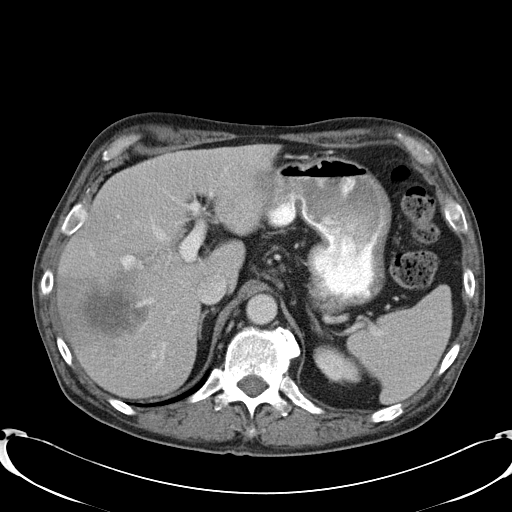
[im 43/90  soft-tissue]
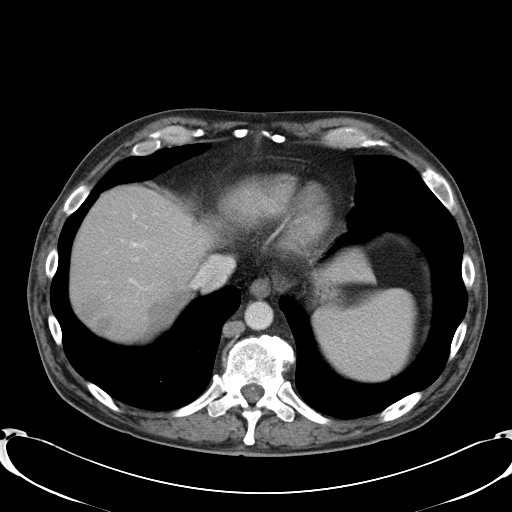
[im 47/90  soft-tissue]
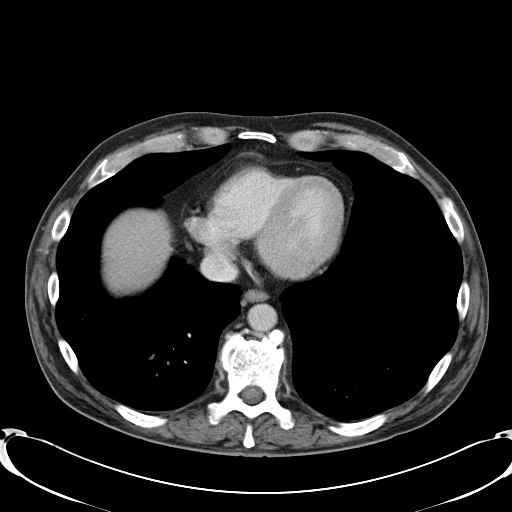
[im 55/90  soft-tissue]
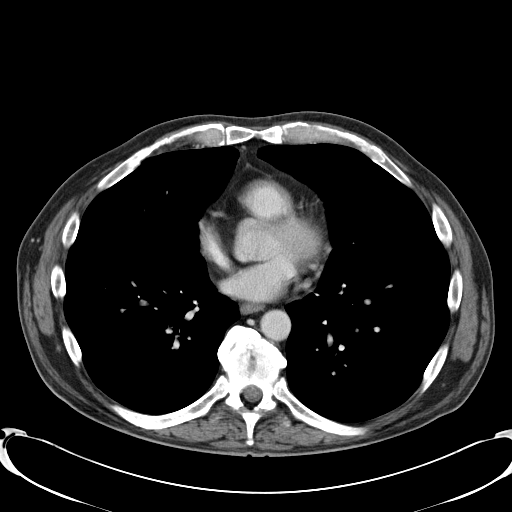
[im 62/90  soft-tissue]
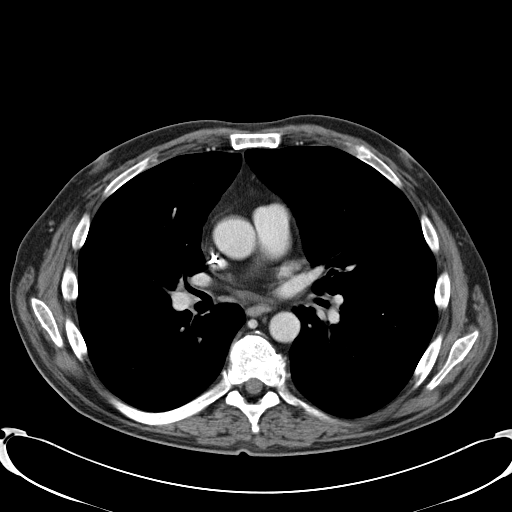
[im 62/90  bone]
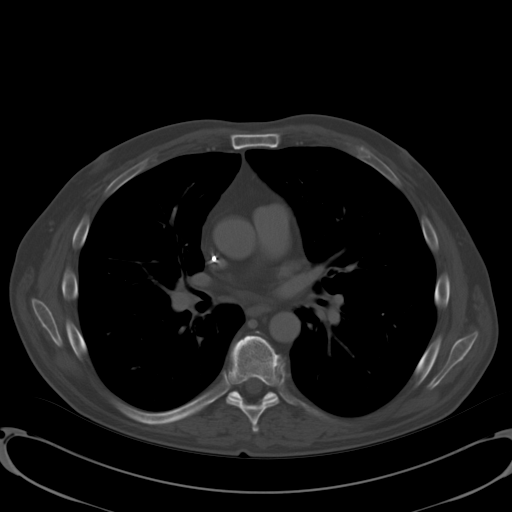
[im 70/90  soft-tissue]
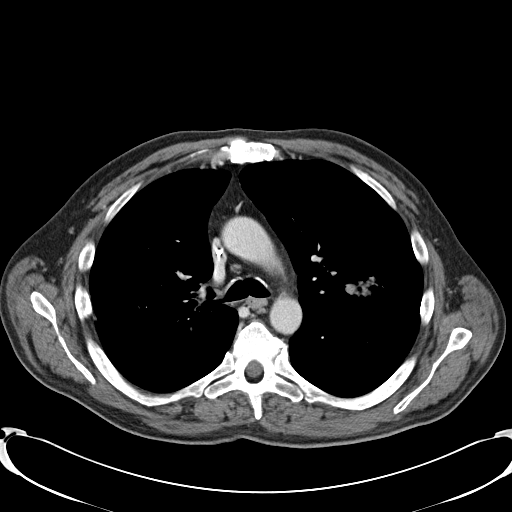
[im 78/90  soft-tissue]
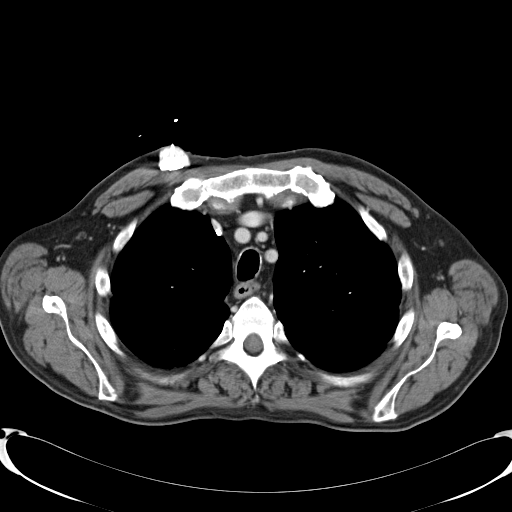
[im 86/90  soft-tissue]
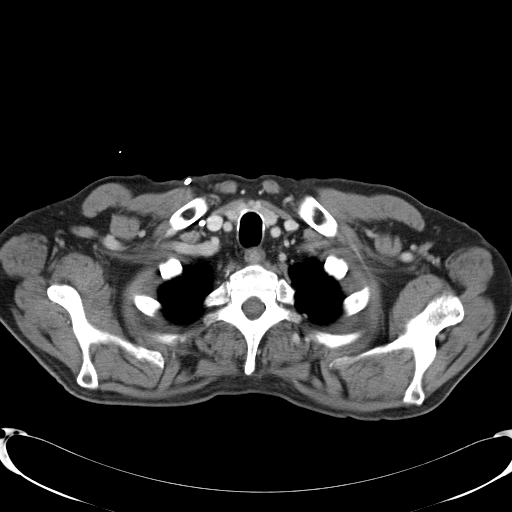

[Series 602: <mpr thick range> · coronal · 0.87mm/px · 3 of 88 slices shown]
[im 30/88  soft-tissue]
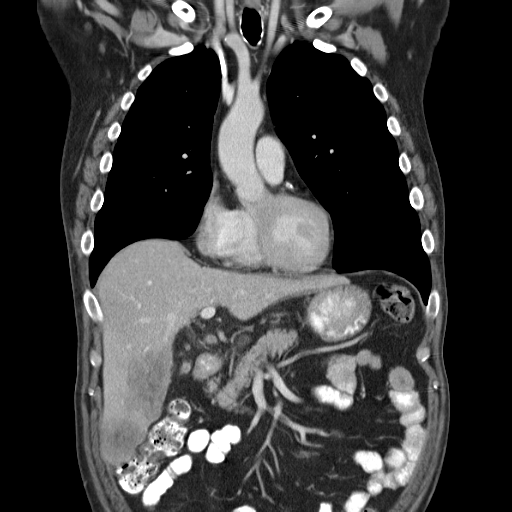
[im 39/88  soft-tissue]
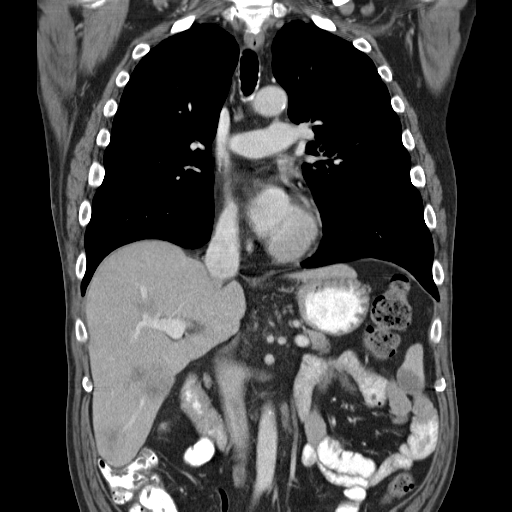
[im 49/88  soft-tissue]
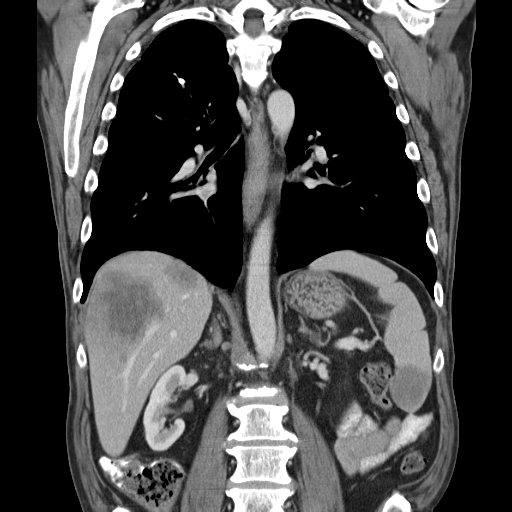

[15 of 46 positions shown; findings below may reference images not displayed]

FINDINGS: CT CHEST FINDINGS

Lungs/Pleura: Secretions within the trachea. Surgical changes within
the right lung apex.

Spiculated partially cavitary left upper lobe lung mass. 3.2 x
cm on image 19 versus 2.3 x 2.1 cm on the prior exam (when
remeasured). Minimal left lower lobe nodularity. Laterally, this is
similar on image 42. A 2 mm more posterior medial nodule on image 43
is felt to be new.

No pleural fluid.

Heart/Mediastinum: No supraclavicular adenopathy. Right-sided
Port-A-Cath which terminates at the fifth low SVC. Bovine arch.
Normal heart size, without pericardial effusion. Multivessel
coronary artery atherosclerosis. No central pulmonary embolism, on
this non-dedicated study. No mediastinal adenopathy. Right hilar
node is upper normal in size at 12 mm, unchanged.

Retrocrural index node measures 1.3 cm on image 48 versus 6 mm on
the prior.

CT ABDOMEN FINDINGS

Abdomen: Progression of hepatic metastasis. Index high right liver
lobe mass measures 6.2 x 6.8 cm on image 54 versus 3.8 x 3.6 cm on
the prior.

A pericholecystic lesion measures 5.0 x 4.5 cm versus 3.5 x 3.0 cm
on the prior. Enlarging lesion in the lateral segment left lobe.

Splenic mass which is presumably a metastasis measures 4.4 x 3.3 cm
today versus 3.4 x 2.5 cm on the prior. An upper normal porta
hepatis node measures 1.1 cm, not readily apparent on the prior.

Normal stomach. Descending duodenal diverticulum. Normal pancreas,
adrenal glands. Air within the gallbladder, without specific
evidence of acute cholecystitis. Stones or sludge are chronic.

Normal kidneys. Separate origins of the common hepatic artery and
splenic artery. Aortic and branch vessel atherosclerosis. Small
retroperitoneal nodes which are not pathologic by size criteria and
similar. Normal abdominal large and small bowel loops. No abdominal
ascites or omental/ peritoneal disease.

A tiny right paracentral ventral abdominal wall hernia.

Bones/Musculoskeletal: Remote left-sided rib fractures.
IMPRESSION: 1. Disease progression within the chest. Enlargement of a partially
cavitary left upper lobe lung nodule with developing retrocrural
adenopathy. Possible new left lower lobe tiny lung nodule.
2. Disease progression within the abdomen. Progressive hepatic
metastasis and splenic metastasis.
3. New air within the gallbladder. Correlate with right upper
quadrant symptoms. No specific evidence of acute cholecystitis. No
other areas of pneumobilia. This could potentially represent
nitrogen gas within gallstones.
4. Advanced atherosclerosis, including within the coronary arteries.
5. Pelvis not imaged.
6. Possible developing porta hepatis nodal metastasis.
